# Patient Record
Sex: Female | Born: 1940 | Race: White | Hispanic: No | Smoking: Former smoker
Health system: Southern US, Community
[De-identification: ages and names within clinical notes are randomized; demographics above are authoritative.]

## PROBLEM LIST (undated history)

## (undated) DIAGNOSIS — E785 Hyperlipidemia, unspecified: Secondary | ICD-10-CM

## (undated) DIAGNOSIS — R42 Dizziness and giddiness: Secondary | ICD-10-CM

## (undated) DIAGNOSIS — J189 Pneumonia, unspecified organism: Secondary | ICD-10-CM

## (undated) DIAGNOSIS — Z87442 Personal history of urinary calculi: Secondary | ICD-10-CM

## (undated) DIAGNOSIS — I10 Essential (primary) hypertension: Secondary | ICD-10-CM

## (undated) DIAGNOSIS — M1990 Unspecified osteoarthritis, unspecified site: Secondary | ICD-10-CM

## (undated) DIAGNOSIS — Z9289 Personal history of other medical treatment: Secondary | ICD-10-CM

## (undated) DIAGNOSIS — G459 Transient cerebral ischemic attack, unspecified: Secondary | ICD-10-CM

## (undated) DIAGNOSIS — G458 Other transient cerebral ischemic attacks and related syndromes: Secondary | ICD-10-CM

## (undated) DIAGNOSIS — I2510 Atherosclerotic heart disease of native coronary artery without angina pectoris: Secondary | ICD-10-CM

## (undated) DIAGNOSIS — I4891 Unspecified atrial fibrillation: Secondary | ICD-10-CM

## (undated) DIAGNOSIS — I639 Cerebral infarction, unspecified: Secondary | ICD-10-CM

## (undated) HISTORY — DX: Hyperlipidemia, unspecified: E78.5

## (undated) HISTORY — PX: UMBILICAL HERNIA REPAIR: SHX196

## (undated) HISTORY — PX: HERNIA REPAIR: SHX51

## (undated) HISTORY — PX: CYSTOSCOPY W/ STONE MANIPULATION: SHX1427

## (undated) HISTORY — PX: CHOLECYSTECTOMY OPEN: SUR202

## (undated) HISTORY — DX: Atherosclerotic heart disease of native coronary artery without angina pectoris: I25.10

## (undated) HISTORY — PX: ABDOMINAL HYSTERECTOMY: SHX81

## (undated) HISTORY — DX: Essential (primary) hypertension: I10

## (undated) HISTORY — PX: CARPAL TUNNEL RELEASE: SHX101

## (undated) HISTORY — PX: DILATION AND CURETTAGE OF UTERUS: SHX78

---

## 2001-03-29 DIAGNOSIS — G459 Transient cerebral ischemic attack, unspecified: Secondary | ICD-10-CM

## 2001-03-29 HISTORY — DX: Transient cerebral ischemic attack, unspecified: G45.9

## 2002-03-29 DIAGNOSIS — G458 Other transient cerebral ischemic attacks and related syndromes: Secondary | ICD-10-CM

## 2002-03-29 HISTORY — DX: Other transient cerebral ischemic attacks and related syndromes: G45.8

## 2002-04-29 HISTORY — PX: CORONARY ANGIOPLASTY: SHX604

## 2002-05-04 ENCOUNTER — Ambulatory Visit (HOSPITAL_COMMUNITY): Admission: RE | Admit: 2002-05-04 | Discharge: 2002-05-05 | Payer: Self-pay | Admitting: Cardiology

## 2002-07-23 ENCOUNTER — Encounter: Payer: Self-pay | Admitting: Vascular Surgery

## 2002-07-23 ENCOUNTER — Ambulatory Visit (HOSPITAL_COMMUNITY): Admission: RE | Admit: 2002-07-23 | Discharge: 2002-07-24 | Payer: Self-pay | Admitting: Vascular Surgery

## 2002-09-12 ENCOUNTER — Ambulatory Visit (HOSPITAL_COMMUNITY): Admission: RE | Admit: 2002-09-12 | Discharge: 2002-09-12 | Payer: Self-pay | Admitting: Neurology

## 2002-10-19 ENCOUNTER — Ambulatory Visit (HOSPITAL_COMMUNITY): Admission: RE | Admit: 2002-10-19 | Discharge: 2002-10-19 | Payer: Self-pay | Admitting: Interventional Radiology

## 2002-10-28 HISTORY — PX: CAROTID ARTERY - SUBCLAVIAN ARTERY BYPASS GRAFT: SUR178

## 2002-11-20 ENCOUNTER — Encounter: Payer: Self-pay | Admitting: Vascular Surgery

## 2002-11-22 ENCOUNTER — Inpatient Hospital Stay (HOSPITAL_COMMUNITY): Admission: RE | Admit: 2002-11-22 | Discharge: 2002-11-23 | Payer: Self-pay | Admitting: Vascular Surgery

## 2003-04-30 HISTORY — PX: CARDIAC CATHETERIZATION: SHX172

## 2003-05-17 ENCOUNTER — Ambulatory Visit (HOSPITAL_COMMUNITY): Admission: RE | Admit: 2003-05-17 | Discharge: 2003-05-19 | Payer: Self-pay | Admitting: Cardiology

## 2003-05-27 ENCOUNTER — Inpatient Hospital Stay (HOSPITAL_COMMUNITY): Admission: AD | Admit: 2003-05-27 | Discharge: 2003-06-11 | Payer: Self-pay | Admitting: Cardiology

## 2003-05-28 HISTORY — PX: CORONARY ARTERY BYPASS GRAFT: SHX141

## 2003-05-28 HISTORY — PX: MEDIASTINAL EXPLORATION: SHX5065

## 2003-05-29 ENCOUNTER — Encounter: Payer: Self-pay | Admitting: Cardiology

## 2003-07-05 ENCOUNTER — Encounter
Admission: RE | Admit: 2003-07-05 | Discharge: 2003-07-05 | Payer: Self-pay | Admitting: Thoracic Surgery (Cardiothoracic Vascular Surgery)

## 2007-08-01 ENCOUNTER — Ambulatory Visit: Payer: Self-pay | Admitting: Vascular Surgery

## 2009-01-30 ENCOUNTER — Ambulatory Visit: Payer: Self-pay | Admitting: Vascular Surgery

## 2010-03-05 ENCOUNTER — Ambulatory Visit: Payer: Self-pay | Admitting: Vascular Surgery

## 2010-08-11 NOTE — Procedures (Signed)
CAROTID DUPLEX EXAM   INDICATION:  Followup carotid stenosis and left common carotid artery,  distal subclavian artery bypass graft   HISTORY:  Diabetes:  no  Cardiac:  CABG  Hypertension:  yes  Smoking:  Previous  Previous Surgery:  Left common carotid artery to subclavian artery  bypass graft in 2004  CV History:  CVA in 2001 and 2003 and a TIA with right upper extremity  and lower extremity weakness  Amaurosis Fugax No, Paresthesias Yes, Hemiparesis No                                       RIGHT             LEFT  Brachial systolic pressure:         210               200  Brachial Doppler waveforms:         Normal            Normal  Vertebral direction of flow:        Not visualized    Antegrade  DUPLEX VELOCITIES (cm/sec)  CCA peak systolic                   71                Proximal bypass  graft 320 / mid CCA 72  ECA peak systolic                   57                80  ICA peak systolic                   87                151  ICA end diastolic                   28                43  PLAQUE MORPHOLOGY:                  mixed             mixed  PLAQUE AMOUNT:                      mild              Moderate  PLAQUE LOCATION:                    ICA               CCA / ICA / ECA   IMPRESSION:  1. Doppler velocities suggest 20% to 39% stenosis in the right      internal carotid artery.  2. Doppler velocities suggest 40% to 59% stenosis in the left internal      carotid artery.  3. Patent left subclavian artery to common carotid artery bypass graft      with increased velocities noted in the proximal graft of 320      cm/second.  4. Right  vertebral artery not visualized.  Left vertebral artery      suggests antegrade flow.  5. No significant changes from previous exams.   ___________________________________________  Cristal Deer  Adele Dan, M.D.   NT/MEDQ  D:  03/05/2010  T:  03/05/2010  Job:  [id number]

## 2010-08-11 NOTE — Assessment & Plan Note (Signed)
OFFICE VISIT   Diamond [name], Diamond [name]  DOB:  Apr 18, 1940                                       08/01/2007  ONGEX#:[id number]   I saw the patient in the office today for continued followup of her  carotid disease.  This is a pleasant 70 year old woman who I performed a  left carotid subclavian bypass graft on in 2004.  Since that time I have  been following her with a moderate left carotid stenosis.  Since I saw  her last a year and a half ago she has had no history of stroke, TIAs,  expressive or receptive aphasia or amaurosis fugax.  She continues to  take aspirin and Plavix.   REVIEW OF SYSTEMS:  On review of systems she has had no recent chest  pain, chest pressure, palpitations or arrhythmias.  The remainder of her  review of systems is documented on the medical history form in her  chart.   PHYSICAL EXAMINATION:  On physical examination this is a pleasant 6-  year-old woman who appears her stated age.  Blood pressure is 181/83.  Heart rate is 59.  I do not detect any carotid bruits.  Lungs are clear  bilaterally to auscultation.  On cardiac exam she has a regular rate and  rhythm.  She has a palpable radial pulse bilaterally.  Neurologic exam  is nonfocal.   Her carotid duplex scan shows that there has been no change in her  carotid disease.  She has a 40-59% left carotid stenosis with no  significant stenosis on the right.  Her carotid subclavian bypass graft  is patent.   Overall, I am pleased with her progress and will see her back in 18  months for a followup duplex scan.  She knows the symptoms of  cerebrovascular disease and knows to call if she develops any such  symptoms.  In the meantime she will remain on her aspirin and Plavix.   Diamond [name], M.D.  Electronically Signed   CSD/MEDQ  D:  08/01/2007  T:  08/02/2007  Job:  [id number]

## 2010-08-11 NOTE — Procedures (Signed)
CAROTID DUPLEX EXAM   INDICATION:  Follow up carotid disease and left common carotid artery to  subclavian artery bypass graft.   HISTORY:  Diabetes:  No  Cardiac:  CABG  Hypertension:  Yes  Smoking:  Previous  Previous Surgery:  Left CCA to SCA bypass graft, 2004.  CV History:  CVA 2001 and 2003 and TIA, with right upper extremity and  lower extremity weakness.  Amaurosis Fugax No, Paresthesias Yes, Hemiparesis No                                       RIGHT             LEFT  Brachial systolic pressure:         115               124  Brachial Doppler waveforms:         WNL               WNL  Vertebral direction of flow:        antegrade         antegrade  DUPLEX VELOCITIES (cm/sec)  CCA peak systolic                   80                89  ECA peak systolic                   87                72  ICA peak systolic                   104               189  ICA end diastolic                   36                56  PLAQUE MORPHOLOGY:                  heterogeneous     calcific  PLAQUE AMOUNT:                      mild              moderate  PLAQUE LOCATION:                    ICA/ECA           ICA/ECA   IMPRESSION:  1. Right internal carotid artery shows evidence of 20% to 39%      stenosis.  2. Left internal carotid artery shows evidence of 40% to 59% stenosis      with a slight increase in velocities.  3. Patent left common carotid artery to subclavian artery bypass      graft.        ___________________________________________  Di Kindle. Edilia Bo, M.D.   AS/MEDQ  D:  01/30/2009  T:  01/31/2009  Job:  [id number]

## 2010-08-11 NOTE — Assessment & Plan Note (Signed)
OFFICE VISIT   Diamond [name], Diamond [name]  DOB:  [birth year]                                       01/30/2009  ZOXWR#:[id number]   I saw the patient in the office today for continued followup of her  carotid disease.  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  Since that time I had been  following her with a mild left carotid stenosis.  She comes in for a  year and a half followup visit.  Since I saw her last, she had no  history of stroke, TIAs, expressive or receptive aphasia or amaurosis  fugax.  She has had no pain or weakness in her left arm where she had  the bypass.   In addition, she has a history of hypercholesterolemia which has been  stable on her current medications.  She also has coronary artery disease  but has had no recent symptoms.  She has had no chest pain or chest  pressure.  She underwent coronary revascularization in 2005 by Dr.  Dorris Fetch.   PAST MEDICAL HISTORY:  Otherwise fairly unremarkable.  She denies any  history of diabetes, hypertension, history of congestive heart failure  or history of COPD.   FAMILY HISTORY:  Her father died from a heart attack at age 45.  She is  unaware of any history of premature cardiovascular disease.   SOCIAL HISTORY:  She is widowed.  She has 1 child.  She quit tobacco 10  years ago.  She does not use alcohol on a regular basis.   REVIEW OF SYSTEMS:  NEUROLOGIC:  She has had occasional headaches.  MUSCULOSKELETAL:  She does have a history of arthritis and joint pain.  General, cardiac, pulmonary, GI, GU, psychiatric, ENT, hematologic, and  integument review of systems are unremarkable.  VASCULAR:  She did have a previous left hemispheric stroke in the past  with some mild residual right lower extremity weakness.  She has had no  claudication, rest pain or nonhealing ulcers.  No  history of DVT or  phlebitis.   PHYSICAL EXAMINATION:  This is a pleasant 70 year old woman who appears  her stated age.  Blood pressure 149/91 on the right and 139/81 on the  left, heart rate is 56, respiratory rate is 16.  HEENT:  Extraocular  motions are intact.  Pupils are equal, round and reactive to light and  accommodation.  Conjunctivae are normal.  Neck:  Supple.  There is no  JVD.  There is no cervical lymphadenopathy.  Lungs:  Clear bilaterally  to auscultation.  There are no rales, rhonchi or wheezing.  Cardiovascular:  She does have a left carotid bruit.  She has a regular  rate and rhythm without murmur or gallop appreciated.  She has no  significant peripheral edema.  She has palpable radial pulses and  palpable femoral pulse.  I cannot palpate posterior tibial pulses;  however, both feet are warm and well-perfused without ischemic ulcers.  Abdomen:  Soft and nontender with normal pitched bowel sounds.  I do not  appreciate any masses.  Musculoskeletal:  There are no major deformities  or cyanosis.  Neurologic:  She really has good strength in her upper  extremities bilaterally with no focal  weakness noted.  Skin:  There are  no significant ulcers or rashes.   I did independently interpret her carotid duplex scan today which shows  evidence of a 40% to 59% left carotid stenosis with no significant  stenosis on the right.  Both vertebral arteries are patent with normally  directed flow, and arm pressures are equal.  I have compared this to her  previous study back in May 2009 and although the stenosis in the left is  in the same category, the velocities have increased slightly.   I recommend we see her back in 1 year for a followup carotid duplex  scan.  She understands we would not consider left carotid endarterectomy  unless the stenosis progressed to greater than 80% or she developed new  neurologic symptoms.  She does know to continue taking her aspirin and   Plavix.   Di Kindle. Edilia Bo, M.D.  Electronically Signed   CSD/MEDQ  D:  01/30/2009  T:  01/31/2009  Job:  [id number]

## 2010-08-11 NOTE — Procedures (Signed)
CAROTID DUPLEX EXAM   INDICATION:  Followup known carotid artery disease.   HISTORY:  Diabetes:  No.  Cardiac:  CABG in 06/03/2003.  Hypertension:  Yes.  Smoking:  Quit in 2001.  Previous Surgery:  Left subclavian to CCA bypass graft in 2004.  CV History:  CVA in 2001 and 2003.  Amaurosis Fugax No, Paresthesias No, Hemiparesis No                                       RIGHT             LEFT  Brachial systolic pressure:         140               150  Brachial Doppler waveforms:         Biphasic          Biphasic  Vertebral direction of flow:        Antegrade         Antegrade  DUPLEX VELOCITIES (cm/sec)  CCA peak systolic                   77                68  ECA peak systolic                   51                62  ICA peak systolic                   101               160  ICA end diastolic                   30                49  PLAQUE MORPHOLOGY:                  Heterogeneous     Calcified  PLAQUE AMOUNT:                      Mild              Mild to moderate  PLAQUE LOCATION:                    ICA and ECA       ICA   IMPRESSION:  1. A 40 to 59% stenosis in the left ICA.  Patent left CCA to      subclavian bypass graft.  2. A 1 to 39% stenosis noted in the right ICA.  Antegrade bilateral      vertebral arteries.      ___________________________________________  Di Kindle. Edilia Bo, M.D.   MG/MEDQ  D:  08/01/2007  T:  08/01/2007  Job:  [id number]

## 2010-08-14 NOTE — H&P (Signed)
[name], [name] NO.:  [id number]   MEDICAL RECORD NO.:  [id number]                   PATIENT TYPE:  OIB   LOCATION:  2899                                 FACILITY:  [facility]   PHYSICIAN:  Diamond [name], M.D. [facility]              DATE OF BIRTH:  [birth year]   DATE OF ADMISSION:  05/04/2002  DATE OF DISCHARGE:                                HISTORY & PHYSICAL   CHIEF COMPLAINT:  Dyspnea on exertion.   HISTORY OF PRESENT ILLNESS:  The patient is a 70 year old female with no  known history of coronary artery disease who was evaluated by Dr. Tomie [name]  for dyspnea on exertion.  There was concern for this being an anginal  equivalent and she was scheduled for a stress test.  She had an adenosine  Cardiolite and during the test, she reacted to the adenosine with reaction  that the patient described as she was told she quit breathing.  She states  they sat her up and gave her IV fluids and her symptoms resolved.  The  Cardiolite images showed lateral ischemia.  Her EF was within normal limits.  She was referred for catheterization.  The patient describes a history of  consistent dyspnea on exertion, but no resting symptoms.  She is symptom  free at the time of exam.   PAST MEDICAL HISTORY:  1. Hypertension.  2. Dyslipidemia.  3. No history of diabetes.  4. No family history of premature coronary artery disease.  5. Remote history of tobacco.  6. She is not obese.  7. She has a history of CVA x3.  8. Peripheral vascular disease with left carotid that has a 60-79% stenosis.  9. Echocardiogram showed mild MR, but no other significant abnormalities.  10.      History of gastroesophageal reflux disease and peptic ulcer     disease.   PAST SURGICAL HISTORY:  1. Status post cholecystectomy.  2. Status post hysterectomy.  3. Evaluation at [facility] after her CVAs, but the results of the     evaluation are not available at this time.   ALLERGIES:  CODEINE.   The patient states that three years ago she had a  reaction including itching and hives secondary to IVP DYE.  She is not sure  whether she has had dye since that time or not, but she had burning with a  medication that they gave her at [facility] during a test.   MEDICATIONS:  1. Atenolol 25 mg q.d.  2. Diovan 80 mg q.d.  3. Plavix 75 mg q.h.s.  4. Aspirin 325 mg q.d.  5. Atenolol 50 mg q.d.  6. Protonix 40 mg q.d.  7. Naproxen 500 mg b.i.d. p.r.n.  8. Trihexyphenadyl 2 mg q.h.s. p.r.n.  9. Pravachol 20 mg q.d.   SOCIAL HISTORY:  She lives in [place] with her daughter.  She does  not  drink alcohol.  She is disabled secondary to CVAs.  She has a 60-pack-year  of tobacco use and quit in 2001.  She does not exercise.   FAMILY HISTORY:  Her mother died at age 50 of a CVA.  Father died at age 53  of an MI.  She has one sister with a DVT.  Brother with a congenital heart  defect.  No other coronary artery disease in family.   REVIEW OF SYMPTOMS:  MUSCULOSKELETAL:  Positive for occasional night time  leg cramps and occasional anxiety.  CARDIOPULMONARY:  The dyspnea on  exertion is as described above.  No recent fevers or chills.  No recent  illness of any type.  GASTROINTESTINAL:  She denies any kind of reflux  symptoms, hematemesis or melena at this time.  Review of systems is  otherwise negative.   PHYSICAL EXAMINATION:  VITAL SIGNS:  Temperature 97.9, blood pressure  138/60, pulse 46, respirations 20.  GENERAL:  She is a well-developed, well-nourished, middle-aged, white female  in no acute distress.  HEENT:  Head is normocephalic and atraumatic with pupils equal round and  reactive to light and accommodation.  Extraocular movements intact.  Sclerae  clear.  Nares without discharge.  NECK:  Supple without lymphadenopathy, thyromegaly or JVD.  Bilateral  carotid bruits, right greater than left.  CARDIAC:  Heart is regular rate and rhythm with a soft 1-2/6 systolic  ejection  murmur at the apex.  Her distal pulses are 1-2+.  No femoral bruits  are appreciated and femoral pulses are slightly decreased.  LUNGS:  Clear to auscultation bilaterally.  ABDOMEN:  Soft, nontender with active bowel sounds and no hepatosplenomegaly  noted.  EXTREMITIES:  No clubbing, cyanosis or edema.  MUSCULOSKELETAL:  There is no joint deformity or effusion with no spine or  CVA tenderness.  NEUROLOGIC:  She is alert and oriented.  Cranial nerves 2-12 grossly intact.   LABORATORY DATA AND X-RAY FINDINGS:  Chest x-ray was performed as an  outpatient and report is not available at this time.  EKG dated March 12, 2002, shows sinus bradycardia with T wave changes in V5-V6 and no old  records to compare.   Hemoglobin 11.6, hematocrit 34.6, WBC 6.7, platelets 281.  Sodium 138,  potassium 4.9, chloride 102, CO2 22, BUN 22, creatinine 0.8 and glucose 99.  Her TSH was 1.490 with an INR of 1.1 and PTT 27.   ASSESSMENT/PLAN:  1. Dyspnea on exertion with abnormal Cardiolite.  This is most likely an     anginal equivalent.  She is for cardiac catheterization and all of her     laboratory values are within normal limits.  She will be followed up     after the cardiac catheterization.  2. The patient is otherwise stable and is to be continued on her own     medications.     Diamond [name], P.A. [facility]                  Diamond [name], M.D. [facility]    RG/MEDQ  D:  05/04/2002  T:  05/04/2002  Job:  [id number]

## 2010-08-14 NOTE — Consult Note (Signed)
NAME:  Diamond [name], Diamond [name]                            ACCOUNT NO.:  [id number]   MEDICAL RECORD NO.:  [id number]                   PATIENT TYPE:  INP   LOCATION:  6524                                 FACILITY:  [facility]   PHYSICIAN:  Salvatore Decent. Dorris Fetch, M.D.         DATE OF BIRTH:  02-23-1941   DATE OF CONSULTATION:  05/27/2003  DATE OF DISCHARGE:                                   CONSULTATION   CHIEF COMPLAINT:  Exertional shortness of breath.   HISTORY OF PRESENT ILLNESS:  Diamond [name] is a 70 year old white female with a  history of atherosclerotic cardiovascular disease including coronary artery  disease with a prior PTCA of her left circumflex status post a history of  cerebrovascular accident x3; two strokes, and a TIA. She presents with  approximately a one year history of exertional shortness of breath. This has  been worse over the past couple of months. She was seen and evaluated by Dr.  Tomie [name] in [place] who did a stress test. This showed a normal ejection  fraction, but there was evidence of anterolateral ischemia. She subsequently  then was sent for evaluation by Diamond [name] and at catheterization  again was found to have a normal ejection fraction. The right coronary had a  60% stenosis, the circumflex had a 50%, the LAD had a 50-60% at the take off  of the first diagonal which arose from a right angle and a 70% lesion at the  osteal of the diagonal. She had oozing from her left groin site, but was  subsequently discharged the next day. Diamond [name] saw Diamond [name] in the office  today to further discuss options for possible angioplasty of the right  coronary tomorrow, but and EKG in his office noted some anterior lateral T-  wave changes and she was admitted for further evaluation. She says that she  has had a burning type of pain in the substernal area, this is usually  related to exertion and sometimes occurs with her shortness of breath. She  has significant exertional  dyspnea, which has been progressive over the past  year.   PAST MEDICAL HISTORY:  Significant for atherosclerotic cardiovascular  disease. She has a history of coronary artery disease, history of TIA x1 and  stroke x2. She has some mild residual from her stroke with a mild right hand  intention tremor and some right lower extremity weakness. She also has a  history of hypertension, hyperlipidemia, mild mitral regurgitation by echo,  history of tobacco abuse, which she quit five years ago. A remote history of  peptic ulcer disease, history of hysterectomy, and cholecystectomy. She had  a left carotid subclavian bypass by Diamond [name] in 2004.   CURRENT MEDICATIONS:  1. Plavix 75 mg daily.  2. Protonix 40 mg daily.  3. Mobic 7.5 mg daily.  4. Pravachol 80 mg daily.  5. Diovan 80 mg daily.  6. Aspirin  325 mg daily.   ALLERGIES:  She had an allergic reaction to IV dye 30 years ago. She also  has a history of an allergy to codeine.   SOCIAL HISTORY:  She is retired. She has a long history of tobacco abuse,  but quit five years ago just prior to her second stroke.   FAMILY HISTORY:  Mother died at the age of 25 of a stroke, father at age 30  of an MI. She has six siblings all alive and well.   REVIEW OF SYSTEMS:  Exertional shortness of breath. She does complain of  cramping pain in her legs when she walks, although she was walking up to  three miles daily last year. She has not had any dizziness or syncopal  spells since her left carotid subclavian bypass. She does have some  paroxysmal nocturnal dyspnea. No orthopnea. No peripheral edema. No history  of DVT. She does bleed and bruise easily. All other systems are negative.   PHYSICAL EXAMINATION:  GENERAL:  Diamond [name] is a 70 year old white female in  no acute distress. Well-developed, well-nourished. She is overweight.  VITAL SIGNS:  Heart rate is 60 and regular. Respirations are 16.  HEENT:  Unremarkable.  NEUROLOGIC:   She is alert and oriented x3. Her speech is fluent. She does  have slight tremor of the right hand. She also has some weakness on  dorsiflexion of the right lower extremity.  NECK:  Supple. She has a left carotid bruit. She has a well-healed scar in  the left supraclavicular fossa from a previous carotid subclavian bypass.  CARDIAC:  Regular rate and rhythm. Normal S1 and S2. No murmur, rub or  gallop.  LUNGS:  Clear with equal breath sounds bilaterally.  ABDOMEN:  Soft and nontender.  EXTREMITIES:  Without cyanosis, clubbing, or edema. She does have a palpable  left radial pulse and a 1+ right radial pulse. Not able to palpate distal  pulses on her legs.  SKIN:  Warm and dry.   LABORATORY DATA:  From her last admission her sodium was 135, potassium 3.9,  BUN and creatinine 21 and 1.2. Hematocrit 33, white count 10.1, platelets  340.   EKG shows anterolateral T-wave change.   IMPRESSION:  Diamond [name] is a 70 year old white female with a history of  atherosclerotic cardiovascular disease. She now presents with exertional  shortness of breath by history with positive Cardiolite and positive EKG  changes. She had a cardiac catheterization last week, which showed three  vessel disease, although she does not have any critical type lesions. She  did have some heavily calcified lesions in her LAD and her diagonal and the  diagonal rose at a right angle was not favorable for percutaneous  intervention. There was consideration given to percutaneous coronary  intervention of her right coronary, but given her anterolateral EKG changes  and Cardiolite which showed anterolateral changes it was thought that she  needed complete revascularization.   Given her history, positive stress test, EKG changes, and catheterization  findings coronary artery bypass grafting is indicated. She is potentially a candidate for off pump grafting. I discussed with Diamond [name] the indications,  risks, benefits, and  alternatives. She understands the risks of surgery  included, but not limited to death, stroke, MI, DVT, bleeding, possible need  for transfusions, and correction as well as other organ system dysfunction  including respiratory, renal, or GI complications. She understands these  risks, accepts them, and agrees to proceed. She understands that  she is at  high risk for a stroke relative to the general population given her prior  history, but she might have less risk of a stroke with an off pump approach.  Given that she needs multiple grafts we will not be able to totally avoid  clamping her aorta. She also may need to have a free mammary utilized given  her history of subclavian disease.   The patient has been on Plavix long term for the past five years and had  bleeding issues post catheterization. We will hold the Plavix beginning  tomorrow since she already received her dose today, but then she will need  to wait five to  seven days off of Plavix before proceeding with surgery, so therefore, we  will plan coronary artery bypass grafting next Monday, March 7 with possible  off pump with cardiopulmonary bypass as backup if technical difficulties  were to arise.                                               Salvatore Decent Dorris Fetch, M.D.    SCH/MEDQ  D:  05/27/2003  T:  05/27/2003  Job:  [id number]   cc:   Arturo Morton. Riley [name], M.D.   Aundra Dubin. Revankar, M.D.  [address]  [place]  [place] [address]  Fax: [phone]   Philemon Kingdom  [address]  [place]  [place] [address]  Fax: [phone]

## 2010-08-14 NOTE — Discharge Summary (Signed)
NAME:  Diamond [name], Diamond [name] NO.:  [id number]   MEDICAL RECORD NO.:  [id number]                   PATIENT TYPE:  INP   LOCATION:  2032                                 FACILITY:  [facility]   PHYSICIAN:  Salvatore Decent. Dorris Fetch, M.D.         DATE OF BIRTH:  [birth year]   DATE OF ADMISSION:  05/27/2003  DATE OF DISCHARGE:  06/11/2003                                 DISCHARGE SUMMARY   ADMIT DIAGNOSIS:  Exertional shortness of breath.   PAST MEDICAL HISTORY:  1. Atherosclerotic cardiovascular disease.  2. History of coronary artery disease.  3. History of TIA x1.  4. History of CVA x2.  The patient does have some mild residual from a     stroke with a mild right hand intention tremor and some right lower     extremity weakness.  5. Hypertension.  6. Hyperlipidemia.  7. Mild mitral regurgitation by echocardiogram.  8. History of tobacco abuse.  9. Remote history of peptic ulcer disease.  10.      Status post hysterectomy.  11.      Status post cholecystectomy.  12.      Subclavian steal syndrome, status post left subclavian bypass by     Dr. Di Kindle. Dickson in 2004.  13.      Status post appendectomy.  14.      Obesity.  15.      History of foot fracture in the past.   ALLERGIES:  The patient has had an allergic reaction to IV DYE,  approximately 30 years ago, as well as a history of an allergy to CODEINE.   DISCHARGE DIAGNOSES:  1. Two-vessel coronary artery disease with unstable angina, status post     coronary artery bypass grafting x4.  2. Ventricular fibrillation arrest, status post coronary artery bypass     grafting; rule out ischemia, status post reexploration of the     mediastinum.   BRIEF HISTORY:  The patient is a 70 year old white female with a history of  atherosclerotic cardiovascular disease including coronary artery disease  with a prior PTCA of the left circumflex, status post history of CVA x3.  The patient, in fact, had 2 strokes  and 1 TIA.  The patient presented with  approximately a 1-year history of exertional shortness of breath which had  been worrisome over the past couple of months.  The patient was evaluated by  Dr. Aundra Dubin. Revankar in [place], who did a stress test which showed normal  ejection fraction but evidence of anterolateral ischemia.  The patient  subsequently was sent for evaluation by Dr. Arturo Morton. Stuckey and at  catheterization, again was found to have a normal ejection fraction with  three-vessel coronary artery disease.  The patient had oozing from a groin  site but was subsequently discharged the next day.  The patient then saw Dr.  Riley Kill in the office on May 27, 2003  to further discuss her options  for a possible angioplasty of the right coronary artery, however, the EKG  had also noted some anterolateral T wave changes and the patient was  subsequently admitted for further evaluation.  The patient stated that she  had had a burning type of pain in the substernal area which was usually  related to exertion and occasionally occurred with shortness of breath.  There was consideration given to percutaneous coronary intervention of her  right coronary but given the anterolateral EKG changes and Cardiolite which  showed anterolateral changes, it was thought that she would better benefit  from surgical revascularization.  Dr. Salvatore Decent. Dorris Fetch was then  consulted at that time.  After review of the patient's information, it was  Dr. Sunday Corn opinion that the patient would indeed benefit from  coronary surgical revascularization.  Given that the patient had been on  Plavix for long-term for the past 5 years, her Plavix was held and it was  necessary for her to wait 5-7 days off the Plavix before proceeding with  surgery.  Dr. Marlan Palau of the neurology service was also consulted on  May 27, 2003 for clearance prior to CABG.  Dr. Anne Hahn evaluated the  patient and it was  his opinion that there was no direct contraindication for  coronary revascularization at that time.   HOSPITAL COURSE:  The patient was admitted on May 27, 2003 after being  evaluated by Dr. Riley Kill in his office that day to discuss options for  possible angioplasty of the right coronary artery, however, an EKG in his  office noted some anterolateral T wave changes and therefore the patient was  admitted for further evaluation.  The patient was found to not be a suitable  candidate for angioplasty and was thought to better benefit from surgical  revascularization.  Dr. Charlett Lango was consulted and it was his  opinion that the patient was a candidate and would be best served with  coronary artery bypass grafting.  Due to the patient's history of  cerebrovascular accidents and subclavian steal syndrome, Dr. Lesia Sago of  neurology service was also consulted for clearance of the patient for  coronary artery bypass grafting.  Dr. Anne Hahn evaluated Ms. Adriana Simas on May 27, 2003 and it was his opinion that there was no direct contraindication  for CABG.  The patient's surgery was then scheduled for approximately 5-7  days after she had been taken off of Plavix.  Routine care was provided for  Ms. Matty in the meantime.  She remained stable throughout the hospital stay  prior to CABG.  The patient was then taken to the OR on June 03, 2003 for  coronary artery bypass grafting x4 with the left internal mammary artery  grafted to the LAD, saphenous vein grafted to the first diagonal, sequential  saphenous vein graft to the distal right coronary artery and posterior  descending artery.  Endoscopic vein harvest was performed on the right  thigh.  The patient tolerated the procedure well and was hemodynamically  stable immediately postoperatively.  The patient was transferred from the OR to the SICU after an uncomplicated bypass surgery in stable condition.  The  patient was initially a  little intravascularly dry.  She was, however, in  sinus rhythm with VVI pacing backup.  Without warning, the patient  subsequently went into a ventricular fibrillation arrest and CPR was  initiated immediately and ACLS was instituted.  The patient was shocked x3  and was biphasic and she was given epinephrine, lidocaine and amiodarone IV.  The patient was converted after 7 minutes.  The patient had a good blood  pressure and EKG after the code showed ST elevation in the inferior leads.  Due to this, the patient was emergently taken back to the OR.  She was  hemodynamically stable at that time.  The patient was returned to the OR,  where she underwent a reexploration of the mediastinum.  There was a good  Doppler signal in all the grafts.  The veins were stripped easily.  A  venotomy was performed in the right coronary/PA graft with a 1.5-mm probe  passed easily into the RCA and PDA.  The patient remained stable throughout  the reexploration surgery and tolerated the procedure well.  A TEE probe  showed good left ventricular function and good wall motion in all segments.  The patient was then transferred back to the SICU in stable condition.  The  patient was subsequently extubated without problem and woke up from  anesthesia neurologically intact.  The patient remained on amiodarone  postoperatively.  The patient continued to slowly progress postoperatively.  On postoperative day 3, the patient was noted to be anemic and therefore she  was transfused with a unit of packed RBCs.  The patient continued to be in  volume overload and her diuresis was continued.  The patient was also noted  to be bradycardic on postoperative day 3 and her A-pacing was continued, as  well as her amiodarone was discontinued as she had had no further  arrhythmias.  The patient was then transferred from the SICU to the  telemetry unit 2000 on postoperative day 3 without complication.  Cardiac  rehab was initiated  on postoperative day 3.  The patient initially did not  tolerate this well, as she was lethargic and her O2 saturations dropped to  81% on room air.  Her O2 saturation quickly returned to 94% on 2 L.  The  patient has continued to slowly progress as expected postoperatively.  Cardiac rehab has been continued postoperatively and she has improved over  time.  The patient had no further arrhythmias and her pacer was removed on  postoperative day 6 without complication.  On postoperative day 7, the  patient was afebrile, her vital signs were stable with a blood pressure of  116/70 and a heart rate of 70.  The patient was in normal sinus rhythm.  The  patient's O2 saturation was 90% on room air.  Her wounds were healing well  and her lungs were clear and cardiac was regular rate and rhythm.  The  patient was doing very well at this time, her pain control was better and she is tolerating the cardiac rehab much better.  The patient's pacer wires  were discontinued and it is felt that as long as she continues to progress  in the same manner, she will be stable for discharge for the next 1-2 days.   LABORATORIES:  CBC on June 07, 2003:  White count 8.1, hemoglobin 9.5,  hematocrit 28.3, platelets 212,000.  BMP on June 08, 2003:  Sodium 133,  potassium 3.5, BUN 24, creatinine 1.2, glucose 94.   CONDITION ON DISCHARGE:  Condition on discharge is improved.   INSTRUCTIONS:   MEDICATIONS:  1. Enteric-coated aspirin 325 mg p.o. daily.  2. Atenolol 50 mg 1/2 tab daily.  3. Diovan 80 mg p.o. daily.  4. Pravachol 80 mg p.o. daily.  5.  Protonix 40 mg p.o. daily.  6. Plavix 75 mg p.o. daily.  7. Lasix 40 mg p.o. daily x5 days.  8. Potassium chloride 20 mEq p.o. daily x5 days.  9. Colace 100 mg 2 tabs p.o. daily.   PAIN MANAGEMENT:  Tylox 1 to 2 p.o. q.4-6 h.   ACTIVITY:  No driving, no lifting more than 10 pounds.  The patient is to  continue daily breathing and walking exercises.   DIET:  Low  salt, low fat, low cholesterol.   WOUND CARE:  The patient may shower daily and clean the incisions with soap  and water.  If wound problems arise, the patient is to call the CVTS office.   FOLLOWUP APPOINTMENTS:  1. The patient will need to establish a followup appointment with Dr.     Tomie China 2 week after discharge.  2. The patient has an appointment with Dr. Dorris Fetch 3 weeks after     discharge; the CVTS office will call the     patient with that appointment.  The patient will also need to go to     [facility] 1 hour before the appointment with Dr.     Dorris Fetch to have a x-ray taken, which she will bring with her to the     appointment with Dr. Dorris Fetch.      Pecola Leisure, PA                      Salvatore Decent. Dorris Fetch, M.D.    AY/MEDQ  D:  06/10/2003  T:  06/12/2003  Job:  [id number]   cc:   Arturo Morton. Riley Kill, M.D. [facility]   Viviann Spare C. Dorris Fetch, M.D.  [address]  [place]  [place] [address]

## 2010-08-14 NOTE — Op Note (Signed)
NAME:  Diamond [name], Diamond [name] NO.:  [id number]   MEDICAL RECORD NO.:  [id number]                   PATIENT TYPE:  INP   LOCATION:  3303                                 FACILITY:  [facility]   PHYSICIAN:  Di Kindle. Edilia Bo, M.D.        DATE OF BIRTH:  1941-01-16   DATE OF PROCEDURE:  11/22/2002  DATE OF DISCHARGE:                                 OPERATIVE REPORT   PREOPERATIVE DIAGNOSES:  Left subclavian occlusion.   POSTOPERATIVE DIAGNOSES:  Left subclavian occlusion.   OPERATION PERFORMED:  Left carotid subclavian bypass with a 6 mm Dacron  graft.   SURGEON:  Di Kindle. Edilia Bo, M.D.   ASSISTANT:  1. Quita Skye. Hart Rochester, M.D.  2. Rowe Clack, P.A.-C.   ANESTHESIA:  General.   INDICATIONS FOR PROCEDURE:  The patient is a 70 year old woman whom I have  been following with a subclavian stenosis.  She developed progressive  symptoms of dizziness and vertebrobasilar symptoms and therefore carotid  subclavian bypass was recommended. The procedure and potential complications  were discussed with the patient in detail preoperatively.  All of her  questions were answered and she was agreeable to proceed.   DESCRIPTION OF PROCEDURE:  The patient was taken to the operating room and  received a general anesthetic. The left supraclavicular area was prepped and  draped in the usual sterile fashion.  An incision was made above the  clavicle extending from the clavicular head at the sternocleidomastoid out  approximately 10 to 12 cm.  The dissection was carried down to the platysma  and then the clavicular head of the sternocleidomastoid was divided.  The  omohyoid was also divided.  The thoracic duct was identified and ligated  between 3-0 silk ties.  The lymphatic fat pad was mobilized inferiorly and  reflected superiorly exposing the anterior scalene muscle. The phrenic nerve  was not identified over the anterior scalene but was more medial to this and  this was identified and preserved.  The anterior scalene was then divided  and beneath this the subclavian artery controlled with a blue vessel loop as  was a thyrocervical trunk branch.  Through the same incision, the common  carotid artery was exposed posterior to the jugular vein.  An adequate  length was exposed that it could be clamped for the proximal anastomosis.  The patient was then heparinized.  Clamps were placed proximally and  distally on the common carotid artery and a longitudinal arteriotomy was  made.  A 5 mm punch was used and then the arteriotomy extended slightly.  The 6 mm Dacron graft was then sewn end-to-side to the common carotid artery  using a 5-0 Prolene suture.  The patient was then placed in Trendelenburg  position and then prior to completing the end-to-end anastomosis the vessels  were back bled and flushed appropriately and then the anastomosis completed  and the graft clamped.  Flow was re-established  through the common carotid  artery.  Next the subclavian artery was clamped proximally and distally and  the thyrocervical branch was controlled and then a longitudinal arteriotomy  made in the subclavian artery.  The graft was then cut to the appropriate  length, spatulated and sewn end-to-side to the subclavian artery using a  continuous 5-0 Prolene suture.  Prior to completing the anastomosis, the  vessels were backbled  and flushed appropriately and the anastomosis  completed. The flow was then re-established to the left arm.  At the  completion, hemostasis was obtained. The wound was irrigated with copious  amounts of saline.  The wound was then closed with a deep layer of 3.0  Vicryl.  The platysma was closed with running 3-0 Vicryl and the skin closed  with a 4-0 subcuticular stitch.  A sterile dressing was applied.  The  patient awoke neurologically intact.                                                  Di Kindle. Edilia Bo, M.D.    CSD/MEDQ   D:  11/22/2002  T:  11/22/2002  Job:  [id number]   cc:   Melvyn Novas, M.D.  [address]  [address]  [place]  [place] [address]  Fax: [phone]

## 2010-08-14 NOTE — H&P (Signed)
NAME:  Diamond [name], Diamond [name] NO.:  [id number]   MEDICAL RECORD NO.:  [id number]                   PATIENT TYPE:  INP   LOCATION:  6524                                 FACILITY:  [facility]   PHYSICIAN:  Arturo Morton. Riley Kill, M.D.             DATE OF BIRTH:  26-Oct-1940   DATE OF ADMISSION:  05/27/2003  DATE OF DISCHARGE:                                HISTORY & PHYSICAL   CHIEF COMPLAINT:  Dyspnea.   HISTORY OF PRESENT ILLNESS:  Diamond [name] is a 70 year old female who has been  referred by Dr. Tomie China.  She had prior PCI involving the A-V circumflex  which was a small vessel about a year ago.  She now presents with recurrent  episodes of shortness of breath and had a Cardiolite which suggested  anterolateral ischemia.  Catheterization showed a significant diagonal  lesion which was not felt to be favorable for percutaneous intervention, as  well as segmental progressive disease of the LAD.  Neither of these was felt  to be ideal for intervention.  In addition, she had a right lesion which was  slightly worse than previously.  We had considered PCI of the RCA but she  presents today for an evaluation prior to the intervention, and there are  anterolateral EKG changes noted on EKG.  Because of this, she is admitted  for further preprocedural surgical consultation.   PAST MEDICAL HISTORY:  1. 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  She had a TIA     in 2003 with some confusion and dizziness.  2. She has had prior percutaneous intervention by Dr. Juanda Chance in April 2004     and a recent catheterization by me.  3. Hypertension.  4. Recent fracture of the left foot.  5. Prior cholecystectomy.  6. Prior hysterectomy.   ALLERGIES:  The patient has had IVP DYE.  She is also intolerant of CODEINE.   MEDICATIONS:  1. Plavix 75 mg daily.  2. Protonix 40 daily.  3. Mobic 7.5 mg one daily.  4. Pravachol 40 mg two tablets daily.  5. Diovan 80 mg daily.  6. Enteric-coated aspirin 325 mg daily.   REVIEW OF SYSTEMS:  Negative for constitutional symptoms.  HEENT:  There is  some shortness of breath with exertion but that is the main symptomatology  at the present time.  She has some weakness in the right leg.   PHYSICAL EXAMINATION:  GENERAL:  She is an alert and oriented female in no  acute distress.  VITAL SIGNS:  The temperature is 97.4, pulse 50, respiratory rate 18, blood  pressure 144/80, SAO2 is 95% on room air.  NECK:  There is a left bruit noted around  the carotid area and the  subclavian area where she has a scar.  There is minimal right carotid bruit.  LUNGS:  The lung fields are clear to auscultation and percussion.  HEART:  The cardiac rhythm is regular.  There is no significant PMI  displacement.  First and second heart sounds are normal.  There is no S4  gallop.  ABDOMEN:  Soft.  There are soft bifemoral bruits with a small knot in the  left groin at the previous catheterization site and a small amount of  ecchymosis.  Distal pulses are intact and there is no evidence of swelling  or significant edema.  NEUROLOGIC:  Remarkable for diminished motor activity in the right upper and  right lower extremity with 4/5 findings.   EKG reveals anterolateral T wave inversion.  This is new from the previous  tracing.  No other laboratory studies have been obtained.   IMPRESSION:  1. Probable ischemia in the anterolateral territory.  We had initially tried     to consider PCI of the right coronary artery as the diagonal was not     favorable.  However, with the EKG changes I am inclined to have a     surgical consult to see whether she can be revascularized.  With prior     CVA she is at significant risk for periprocedural event but we could     consider possibly an off-pump bypass with a hybrid  approach to the right     coronary.  2. Prior cerebrovascular accident.  3. Prior percutaneous coronary intervention.  4. Prior cholecystectomy.  5. Prior hysterectomy.   PLAN:  We will get a CVTS and neurology consult.  The options at this point  include possible hybrid approach.  Alternatively, we could consider medical  therapy.  We will try to get this firmed up in the next day or two.  Meanwhile, she will be admitted.  She is on Plavix and this will need to be  addressed by the surgical team.                                                Arturo Morton. Riley Kill, M.D.    TDS/MEDQ  D:  05/27/2003  T:  05/27/2003  Job:  [id number]   cc:   Di Kindle. Edilia Bo, M.D.  [address]  [place]  [place] [address]   CVTS office   Aundra Dubin. Revankar, M.D.  [address]  [place]  [place] [address]  Fax: [phone]

## 2010-08-14 NOTE — Cardiovascular Report (Signed)
NAME:  Diamond [name], DEVENEY NO.:  [id number]   MEDICAL RECORD NO.:  [id number]                   PATIENT TYPE:  OIB   LOCATION:  2037                                 FACILITY:  [facility]   PHYSICIAN:  Arturo Morton. Riley Kill, M.D.             DATE OF BIRTH:  [birth year]   DATE OF PROCEDURE:  05/17/2003  DATE OF DISCHARGE:                              CARDIAC CATHETERIZATION   INDICATIONS:  Diamond [name] is a delightful 70 year old woman who has had several  prior strokes.  She also has had some progressive shortness of breath which  was relieved with the last percutaneous intervention involving the AV  circumflex.  She now presents with some exertional shortness of breath.  On  the treadmill, she went for three minutes.  There were no EKG changes or  chest pain, but she did have some anterolateral ischemia.  The current study  was done to assess coronary anatomy.   PROCEDURES:  1. Left heart catheterization.  2. Selective coronary arteriography.  3. Selective left ventriculography.   DESCRIPTION OF PROCEDURE:  Using a Smart needle and sterile prep and drape,  the left femoral artery was entered.  The J wire was passed into the left  femoral artery and a 6 French sheath placed.  Following this, intra-aortic  nitroglycerin was given because of an elevated blood pressure.  She was  given additional relaxation medication and 10 mg of Labetolol.  The left  main was then injected using a standard JL-4 Judkins catheter.  Following  this, a standard Judkins catheter was used to inject the right coronary  artery.  Central aortic and left ventricular pressures were measured with a  pigtail.  Ventriculography was performed in the RAO projection.   I then reviewed the films.  The procedure was subsequently completed.  I  pulled the sheath in the catheterization laboratory and held the groin for  five to seven minutes followed by an additional 15 minutes from the  catheterization  technologist.  There was good hemostasis and she was taken  to the holding area in satisfactory clinical condition.   HEMODYNAMIC DATA:  1. Central aorta:  207/91.  2. Left ventricle:  146/9.  3. No gradient on pullback across the aortic valve.   ANGIOGRAPHIC DATA:  1. Ventriculography was performed in the RAO projection.  Overall systolic     function was well preserved and no segmental abnormalities or contraction     were identified.  2. The left main coronary artery was free of critical disease.  3. The left anterior descending artery coursed to the apex. The LAD itself     is moderately calcified and fairly heavily plaqued.  In the mid portion     at the take off of the septal and diagonal, there is probably 50-60%     segmental narrowing at this location with an MLD in the range of about  2.     The diagonal itself probably has 70% ostial narrowing and has a near     right angle take off from the LAD proper.  Just distal to this area, is a     fair amount of segmental irregularity in a second diagonal branch.  High     grade disease is not noted, however.  4. There is a fair size ramus intermedius vessel.  First marginal has mild     luminal irregularity, but no high grade areas of stenosis.  The AV     circumflex was previously dilated.  This has segmental plaquing of about     50-60%.  5. The right coronary artery is a fairly large caliber vessel.  In the mid     portion of the vessel, there is probably 50-60 and potentially even up to     70% narrowing in the mid vessel.  It is slightly hypodense and moderately     calcified.  This is just after the take off of a right ventricular and     probably SA nodal branch.  The vessel beyond this divides into a PDA and     three posterior lateral branches, all of which are free of critical     disease.   Ventriculography in the RAO projection reveals vigorous global systolic  function with an ejection fraction that exceeds 75%.   There is slight  proximal dilatation of the aortic root.   CONCLUSIONS:  1. Vigorous global systolic function.  2. 50-60% narrowing in the previous balloon site involving the AV     circumflex.  3. 70% narrowing of the diagonal which likely accounts for the patient's     anterolateral ischemia.  4. Segmental plaquing of the left anterior descending artery of moderate     degree.  5. Moderate stenosis of the right coronary artery with probable slight     progression from previous study.   DISPOSITION:  The patient's ischemia by Cardiolite is in the anterolateral  territory.  It does not involve the inferior or right coronary territory by  Cardiolite.  Furthermore, the patient has no chest pain nor EKG changes with  exercise.  Based on this, we would likely lean towards medical therapy, but  percutaneous coronary intervention of the right coronary artery could be  considered.  I will review this with my colleagues.                                               Arturo Morton. Riley Kill, M.D.    TDS/MEDQ  D:  05/17/2003  T:  05/18/2003  Job:  [id number]   cc:   Diamond [name], M.D.  [address]  [place]  [place] [address]  Fax: [phone]   Diamond [name]  [address]  [place]  [place] [address]  Fax: [phone]

## 2010-08-14 NOTE — Op Note (Signed)
NAME:  Diamond [name], Diamond [name] NO.:  [id number]   MEDICAL RECORD NO.:  [id number]                   PATIENT TYPE:  INP   LOCATION:  2302                                 FACILITY:  [facility]   PHYSICIAN:  Salvatore Decent. Dorris Fetch, M.D.         DATE OF BIRTH:  01-08-1941   DATE OF PROCEDURE:  06/03/2003  DATE OF DISCHARGE:                                 OPERATIVE REPORT   PREOPERATIVE DIAGNOSIS:  Two vessel disease with unstable angina.   POSTOPERATIVE DIAGNOSIS:  Two vessel disease with unstable angina.   PROCEDURE:  Median sternotomy, extracorporeal circulation, coronary artery  bypass grafting x4 (left internal mammary artery to the LAD, saphenous vein  graft to first diagonal, sequential saphenous vein graft to distal right  coronary artery and posterior descending).   SURGEON:  Salvatore Decent. Dorris Fetch, M.D.   ASSISTANTEber Jones A. Eustaquio Boyden.   ANESTHESIA:  General.   FINDINGS:  LAD small and intramyocardial, not suitable for off-pump  grafting.  First diagonal also within epicardial fat. These were both fair  quality targets.  The distal coronary and posterior descending good quality  targets.  Good quality saphenous vein. Excellent flow through left internal  mammary artery.   INDICATIONS FOR PROCEDURE:  The patient is a 70 year old female with a  history of severe atherosclerotic cardiovascular disease and history of  multiple previous cerebrovascular accidents.  She presented with unstable  angina and at cardiac catheterization, had two vessel disease.  The patient  had been on Plavix, therefore surgery was delayed to allow the Plavix to  wear off prior to proceeding with surgery to decrease the excessive bleeding  risk associated with ongoing Plavix use prior to the operation.  The  indications, risks, benefits, and alternative procedures were discussed in  detail with the patient.  Because of her risk of previous strokes, it was  discussed as an  option to do off-pump grafting which might decrease her risk  of stroke, however, there was concern with the LAD being suitable and the  patient was advised of this preoperatively and understood that  cardiopulmonary bypass would be the backup option if difficulties were  encountered.   DESCRIPTION OF PROCEDURE:  The patient was brought to the preoperative  holding area on June 03, 2003.  Lines were placed to monitor arterial,  central venous, and pulmonary arterial pressure.  Intravenous antibiotics  were administered.  She was taken to the operating room, anesthetized, and  intubated.  A Foley catheter was placed.  The chest, abdomen, and legs were  prepped and draped in the usual sterile fashion.  A right femoral arterial  line was placed for blood pressure monitoring as the patient had not been  able to have a radial or brachial line placed by anesthesia.  The right  greater saphenous vein then was harvested using a standard technique and  incision was made in the medial aspect of  the right leg.  The greater  saphenous vein was identified and then harvested endoscopically from the  right thigh.  The vein was of good quality.   A median sternotomy was performed and the left internal mammary artery was  harvested using standard technique.  Because of the patient's previous  carotid subclavian bypass, it was unsure if the mammary artery would be a  suitable vessel for grafting.  The vessel appeared to be normal with  takedown.  The patient was fully heparinized prior to dividing the distal  end of the mammary artery.  There was excellent flow through the vessel and  the vessel was of adequate size and caliber with no significant  atherosclerotic disease.  Therefore, the decision was made to use the vessel  as a pedicle graft.   The pericardium was opened.  The ascending aorta was inspected and palpated.  There was no palpable atherosclerotic disease.  A pericardial cradle was   created.  The acute margin of the heart was gently retracted exposing the  distal right coronary and posterior descending.  These vessels were suitable  for grafting in an off-pump fashion.  The heart was gently lifted and a  sponge was placed behind the heart to inspect the LAD and diagonal.  The  patient tolerated this well hemodynamically.  The LAD was a small vessel and  unfortunately was intramyocardial and a deep trough of epicardial fat with a  thin layer of myocardium over top of the vessel.  This vessel was not  suitable for attempted grafting in off-pump fashion.  The decision was made  to proceed with grafting on pump on an elective basis.  At this point in  time, the patient did not have any ischemic event.   The aorta then was cannulated via concentric 2-0 Ethibond pledgeted  pursestring sutures.  The dual stage venous cannula was placed via a  pursestring suture in the right atrial appendage.  Cardiopulmonary bypass  was instituted and the patient was cooled to 32 degrees Celsius.  The  coronary arteries were inspected and anastomotic sites were chosen.  The  conduits were inspected and cut to length.  The foam pad was placed in the  pericardium to protect the left phrenic nerve.  A temperature probe was  placed in the myocardial septum and a cardioplegia cannula was placed in the  ascending aorta.   The aorta was crossclamped. The left ventricle was emptied via the aortic  root vent.  Cardiac arrest then was achieved with a combination of cold  antegrade blood cardioplegia and topical ice saline.  After achieving a  complete diastolic arrest and myocardial septal temperature of 9 degrees  Celsius, the following distal anastomoses were performed.   First a reversed saphenous vein graft was placed sequentially to the distal  right coronary artery and the posterior descending.  A side-to-side anastomosis was performed to the distal right coronary artery just beyond  the  takeoff of the posterior descending. This was done off a side branch of  the vein graft and it was performed with a running 7-0 Prolene suture.  This  anastomosis and all of the others were probed proximally and distally prior  to tying the sutures to confirm patency.  The graft was flushed.  There was  excellent flow through the anastomosis.  The distal end was cut to length  and then anastomosed to the posterior descending which was a 1.5 mm good  quality target.  This anastomosis also  was performed with a running 7-0  Prolene suture.  The graft flushed easily. Cardioplegia was administered.  There was good hemostasis at both anastomoses.   Next, a reversed saphenous vein graft was placed end-to-side to the first  diagonal branch of the LAD.  This was a 1.5 mm fair quality vessel.  It was  very tortuous distally, but a probe did pass up to the point of the  tortuosity.  The vein graft was smaller caliber, but good quality.  It was  anastomosed end-to-side with a running 7-0 Prolene suture.  There was  excellent flow through this graft.  Cardioplegia was administered and again  there was good hemostasis at the anastomosis.   Next, the left internal mammary artery was brought through a window in the  pericardium and the distal end was spatulated and then was anastomosed end-  to-side to the mid LAD which was an intramyocardial vessel.  A 1.5 mm probe  passed proximally, but only a 1 mm probe would pass distally.  It was a fair  quality vessel.  The anastomosis was performed end-to-side with a running 8-  0 Prolene suture.  Again, this anastomosis was probed proximally and  distally prior to tying the suture.  At completion of the anastomosis, the  Bulldog clamp was removed from the mammary artery.  Immediate and rapid  septal rewarming was noted.  The Bulldog clamp was replaced.  The mammary  pedicle was tacked to the epicardial surface of the heart with 6-0 Prolene  sutures.  Additional  cardioplegia then was administered down the vein grafts  and the aortic root.   The vein grafts were cut to length.  The cardioplegia cannula was removed  from the ascending aorta and the proximal vein graft anastomoses were  performed to 4.0 mm punch aortotomies with a running 6-0 Prolene sutures  while under crossclamp.  At completion of the final proximal anastomosis,  the patient was placed in Trendelenburg position.  The heart was filled with  blood.  The bulldog clamp was removed from the left mammary artery and  immediate and rapid septal rewarming was once again noted.  The lungs were  inflated and the aortic root was deaired.  The aortic crossclamp then was  removed.  The total crossclamp time was 68 minutes.  Residual air was  aspirated from the vein grafts before removing the Bulldog clamps.  The  patient spontaneously resumed a rhythm and did not require defibrillation. All proximal and distal anastomoses were inspected for hemostasis while  rewarming.  Epicardial pacing wires were placed on the right ventricle and  right atrium.   After the patient had been rewarmed to a core temperature of 37 degrees  Celsius, she was weaned from cardiopulmonary bypass without difficulty.  She  was DVD paced for rate, but on no inotropic support at the time of  separation from bypass.  The total bypass time was 106 minutes.   Test dose of Protamine was administered and was well tolerated.  The atrial  and aortic cannuli were removed.  The remainder of the Protamine was  administered without incident.  The chest was irrigated with 1 liter of warm  normal saline containing 1 gram of Vancomycin.  Hemostasis was achieved.  A  left pleural and two mediastinal chest tubes were placed through separate  subcostal incisions.  The pericardium was reapproximated with interrupted 3-  0 silk sutures that came together easily without tension or kinking of the  underlying grafts.  The sternum was closed  with interrupted heavy gauge  stainless steel wires.  The pectoralis fascia was closed with a running #1  Vicryl suture.  The remainder of the incision was closed in the standard  fashion.  Subcuticular closure was used on the skin.  The patient did have a  transient drop in cardiac output after closure of the chest.  This responded  appropriately to volume resuscitation.  She then was transported from the  operating room to the surgical intensive care unit in critical, but stable  condition.  All needle, sponge, and instrument counts correct at the end of  the procedure.                                               Salvatore Decent Dorris Fetch, M.D.    SCH/MEDQ  D:  06/03/2003  T:  06/04/2003  Job:  [id number]   cc:   Arturo Morton. Riley Kill, M.D.   Aundra Dubin. Revankar, M.D.  [address]  [place]  [place] [address]  Fax: [phone]   Philemon Kingdom  [address]  [place]  [place] [address]  Fax: [phone]

## 2010-08-14 NOTE — Discharge Summary (Signed)
NAME:  Diamond [name], Diamond [name] NO.:  [id number]   MEDICAL RECORD NO.:  [id number]                   PATIENT TYPE:  OIB   LOCATION:  6532                                 FACILITY:  [facility]   PHYSICIAN:  Charlies Constable, M.D. [facility]              DATE OF BIRTH:  1940/05/23   DATE OF ADMISSION:  05/04/2002  DATE OF DISCHARGE:  05/05/2002                           DISCHARGE SUMMARY - REFERRING   BRIEF HISTORY:  This is a 70 year old female with no known coronary artery  disease, who was evaluated by Dr. Wille Glaser for dyspnea on exertion.  The  patient had an Adenosine Cardiolite that was consistent with lateral  ischemia.  She was referred to Dr. Juanda Chance for further evaluation.   PAST MEDICAL HISTORY:  Significant for hypertension, elevated lipids,  history of cerebrovascular accidents times 3, mitral regurgitation,  peripheral vascular disease with left carotid artery stenosis of 60-70%,  gastroesophageal reflux disease, and  peptic ulcer disease.   ALLERGIES:  PATIENT IS ALLERGIC TO CODEINE AND SHE IS ALSO ALLERGIC TO  CONTRAST DYE.   SOCIAL HISTORY:  Patient has a remote tobacco history.  She quit smoking in  2001.  She lives in [place].   FAMILY HISTORY:  The patient's mother died at age 45 from cerebrovascular  accident.  Father died in his 85s from myocardial infarction.   HOSPITAL COURSE:  As noted, this patient was admitted to [facility]  for further evaluation of dyspnea on exertion with an abnormal Cardiolite.  She underwent cardiac catheterization on 05/04/2002 performed by Dr. Charlies Constable.  She was found to have a 95% stenosis of the circumflex artery.  A  percutaneous transluminal coronary angioplasty was performed reducing the  stenosis to approximately 20%.  She had a residual 50% diagonal, as well as  a 40% right coronary artery.  Her ejection fraction was estimated to be 60%.  The patient tolerated the procedure well. Arrangements were made  to  discharge her the following day  in improved and stable condition.   LABORATORY DATA:  A CBC on the date of discharge revealed hemoglobin 11.1,  hematocrit 32.4, WBCs 12,000, platelets 263,000, BUN 15, creatinine 0.9,  potassium 4.2, sodium 134, glucose 140.  Cardiac enzymes were negative.   DISCHARGE MEDICATIONS:  Patient was told to continue her previous  medications, this included:  1. Atenolol 25 mg daily.  2. Diovan 80 mg daily.  3. Plavix 75 mg at bedtime.  4. Cenestin 1.25 mg frequency not available.  5. Aspirin 325 mg daily.  6. Also listed as being on atenolol 50 mg daily.  7. Protonix 40 mg daily.  8. Naprosyn 500 gm b.i.d. p.r.n.  9. Pravachol 20 mg daily.  10.      Trihexyphenidyl 2 mg at bedtime p.r.n.   DISCHARGE INSTRUCTIONS:  The patient was told to take Tylenol as needed for  pain.  She was to avoid any strenuous activity or driving for at least two  days.  She was told not to lift more than 10 pounds for one week.  She was  to be on a low salt, low fat diet.  She was told to call the office if she  had any increased pain, swelling, or bleeding from the groin.  She was to  follow up with Dr. Wille Glaser in one to two weeks and Dr. Myrtie Cruise as needed  or as scheduled.   PROBLEM LIST AT TIME OF DISCHARGE:  1. Coronary artery disease with percutaneous transluminal coronary     angioplasty of the circumflex performed 2.6.2004, reducing the stenosis     from 95% to 20%.  2. Residual 40% right coronary artery and 50% diagonal.  3. Ejection fraction 60%.  4. History of hypertension.  5. History of elevated lipids.  6. History of cerebrovascular accident times 3.  7. History of mitral regurgitation.  8. Peripheral vascular disease.  9. Gastroesophageal reflux disease and peptic ulcer disease.  10.      Remote tobacco history.  11.      Status post cholecystectomy and hysterectomy.  12.      DYE ALLERGY.     Delton See, P.A. [facility]                  Charlies Constable, M.D. [facility]    DR/MEDQ  D:  05/05/2002  T:  05/05/2002  Job:  [id number]   cc:   Wille Glaser, MD  Jamse Mead, MD  [place]

## 2010-08-14 NOTE — Discharge Summary (Signed)
NAME:  Diamond [name], BARDIN NO.:  [id number]   MEDICAL RECORD NO.:  [id number]                   PATIENT TYPE:  INP   LOCATION:  3303                                 FACILITY:  [facility]   PHYSICIAN:  Di Kindle. Edilia Bo, M.D.        DATE OF BIRTH:  [birth year]   DATE OF ADMISSION:  11/22/2002  DATE OF DISCHARGE:  11/23/2002                                 DISCHARGE SUMMARY   DATE OF SURGERY:  November 22, 2002.   ADMISSION DIAGNOSIS:  Left subclavian stenosis.   PAST MEDICAL HISTORY:  1. Significant for multiple cerebrovascular accidents, resulting in     confusion and dizziness, expressive aphasia, right lower extremity     weakness and right hemiparesis with fears.  2. Coronary artery disease for which the patient had a catheterization and a     PTA stent in April 2004.  3. Hypertension.  4. Fracture of the left foot, approximately four weeks prior to admission.     The patient was in a soft foot immobilizer and was seen by [facility] [place].   ALLERGIES:  1. The patient has an intolerance to IV DYE and  2. CODEINE which results in vomiting and blackout.   DISCHARGE DIAGNOSES:  Left subclavian stenosis.   BRIEF HISTORY:  Ms. Clemson is a 70 year old Caucasian female referred, from  Dr. Vickey Huger, for the evaluation of carotid disease.  Ms. Jonsson was first  seen by Dr. Edilia Bo in April 2004 at which time he ordered a carotid duplex.  The duplex showed a 50% right internal carotid artery stenosis and a 50-70%  left internal carotid artery stenosis.  At that time, Dr. Edilia Bo thought  the patient may be a candidate for left carotid endarterectomy; however, he  first recommended a cerebral arteriogram to further assess the left carotid  stenosis, as it was only moderate by duplex.  Dr. Edilia Bo also wanted to  further evaluate her left subclavian stenosis noted by duplex scan with  evidence of subclavian __________ .  The arteriogram showed  that in fact the  patient had only a very mild stenosis in the left carotid artery.  For this  reason, Dr. Edilia Bo decided not to proceed with the left carotid  endarterectomy.  The patient has suffered progressive dizziness since April  and therefore, the left subclavian occlusion was addressed.  The patient  stated that in July 2004, a radiologist attempted to cross this occlusion  however, this attempt was unsuccessful; therefore, she was sent back to Dr.  Edilia Bo for consideration of the carotid subclavian bypass.   HOSPITAL COURSE:  On November 22, 2002, the patient was taken to the OR for a  left carotid subclavian bypass with a 6-mm Dacron.  The patient was  hemodynamically stable immediately post-op, was extubated without problem  and woke up from anesthesia neurologically intact.  On the date of  discharge,  the patient was without complaint.  Temperature was 97.5, BP of  108/58, and oxygen saturation of [99]%.  The incision was healing well and  there was a palpable left radial pulse.  The patient was neurologically  intact and doing well.  The patient did complain of dizziness when  mobilized.  A normal saline bolus of 500 cc was given.  After the bolus, the  patient was assisted out of bed and ambulated in the hallway.  There was no  further dizziness noted.   LABS:  Labs remained stable.  On November 23, 2002:  White count was 6.4,  hemoglobin 10.9, hematocrit 31.8, platelets 327.  BMP, on November 23, 2002,  sodium 137, potassium 4.2, BUN 22, creatinine 1.3, glucose 100.   CONDITION ON DISCHARGE:  Stable and improved.   INSTRUCTIONS:   MEDICATIONS:  1. Pravachol 40 mg, one p.o. b.i.d.  2. Trihexyphenidyl 2 mg, one p.o. p.r.n.  3. Plavix 75 mg, one p.o. every day.  4. Naproxen 500 mg, one p.o. b.i.d.  5. Cenestin 1.25 mg, one p.o. p.r.n.  6. Protonix 40 mg, one p.o. every day.  7. Aspirin 325 mg, one p.o. every day.  8. Tenormin 25 mg p.o. b.i.d. to be started on November 24, 2002.   PAIN MANAGEMENT:  Tylox 1-2 q.4-6h. p.o. p.r.n. for pain.   DIET:  Heart healthy.   WOUND CARE:  1. Clean incisions with soap and water.  2. No creams or lotions on the incision.  3. If the incision should become red, swollen, painful, or have discharge,     or if the patient has a fever of 101, they are to call [facility] and speak to     the nurses.   SPECIAL INSTRUCTIONS:  Call if the patient has any problems or questions.   FOLLOWUP:  Dr. Edilia Bo would like to see the patient in two weeks.  The  appointment was set for Wednesday, December 05, 2002, at 10 a.m.      Pecola Leisure, PA                      Di Kindle. Edilia Bo, M.D.    AY/MEDQ  D:  11/23/2002  T:  11/25/2002  Job:  [id number]   cc:   Di Kindle. Edilia Bo, M.D.  [address]  [place]  [place] [address]  Fax: [phone]   Melvyn Novas, M.D.  [address]  [address]  [place]  [place] [address]  Fax: [phone]

## 2010-08-14 NOTE — Op Note (Signed)
NAME:  Diamond [name], SITU NO.:  [id number]   MEDICAL RECORD NO.:  [id number]                   PATIENT TYPE:  INP   LOCATION:  2302                                 FACILITY:  [facility]   PHYSICIAN:  Sheldon Silvan, M.D.                   DATE OF BIRTH:  [birth year]   DATE OF PROCEDURE:  06/03/2003  DATE OF DISCHARGE:                                 OPERATIVE REPORT   PROCEDURE PERFORMED:  Intraoperative transesophageal echocardiography (TEE).   INDICATIONS FOR PROCEDURE:  [name] was brought to the operating room today  by Viviann Spare C. Dorris Fetch, M.D.  She was brought originally for coronary  artery bypass grafting.  This was performed successfully and she was taken  to the surgical intensive care unit in good condition.  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  Her  pressure dropped and Dr. Dorris Fetch was at the bedside.  This was treated  appropriately and her rhythm was restored; however, he felt it was  appropriate to return her to the operating room for exploration of her chest  to make certain there were no obvious reasons for the fibrillation.  It was  felt that use of TEE would be appropriate for monitoring and diagnostic  purposes during this time.  She was returned to the operating room while  still under general anesthesia and while still intubated.  Once she was on  the operating table and all monitors were secured, the [employer] TEE probe was passed easily through the oropharynx atraumatically.   The heart was imaged without difficulty.  The left ventricle was seen and  was noted to contract well in all four wall segments.  There were no areas  of hypokinesis noted.  The mitral valve was seen to be structurally intact  as were the chordae.  The Color Flow exam showed trace to 1+ regurgitation  centrally.  The  aortic valve was examined and found to be tricuspid in  nature with very little sclerosis on the cusp edges.  The approximation of  the cusp was adequate.  In the long axis view and under Color Flow exam  there was no regurgitation noted and no stenosis.   The tricuspid valve appeared normal in both structure and function with  Color Flow.  The left atrial appendage was noted to not have thrombus or  smoke.  The interatrial septum was imaged and on color exam showed no PFO.   The right ventricle functioned normally.  After re-exploration of the chest  on closing the chest, two separate examinations were performed of the left  ventricle.  Just prior to closing by wires, the ventricle seemed unchanged.  After complete closure was obtained, the ventricle was re-examined and the  contractility was unchanged as well.  She was returned to the SICU in good  condition after removal of the TEE probe.                                               Sheldon Silvan, M.D.    DC/MEDQ  D:  06/03/2003  T:  06/04/2003  Job:  [id number]

## 2010-08-14 NOTE — Discharge Summary (Signed)
NAME:  Diamond [name], Diamond [name] NO.:  [id number]   MEDICAL RECORD NO.:  [id number]                   PATIENT TYPE:  OIB   LOCATION:  2037                                 FACILITY:  [facility]   PHYSICIAN:  Veneda Melter, M.D.                   DATE OF BIRTH:  15-Jul-1940   DATE OF ADMISSION:  05/17/2003  DATE OF DISCHARGE:  05/19/2003                           DISCHARGE SUMMARY - REFERRING   DISCHARGE DIAGNOSES:  1. Coronary artery disease.  2. History of stroke x3.  3. Hypertension.  4. Dyslipidemia treated.  5. History of mild mitral regurgitation by echo.  6. History of tobacco abuse, quit 5 years ago.  7. History of peptic ulcer disease.  8. Status post hysterectomy.  9. Status post cholecystectomy.   Diamond [name] is a 70 year old patient of Dr. Tomie [name] with a past medical  history significant for coronary artery disease, in 2004 she underwent an  angioplasty of the circumflex reducing the lesion from a 95% lesion to a 20%  lesion post procedure, recently she has noticed dyspnea on exertion as well  as chest pain.  She did see Dr. Tomie [name], underwent a rest/stress Cardiolite  that revealed anterolateral ischemia with a normal ejection fraction.  She  was then referred for cardiac catheterization.   Cardiac catheterization on May 17, 2003 revealed normal EF, the right  coronary artery had a 60% early mid stenosis, the circumflex had a 50%  stenosis at the old PTCA site, the LAD had a 50-60% stenosis at the site of  the first diagonal with a 70% lesion at the ostial to diagonal, at this  point Dr. Riley [name] wanted to review the films with the colleagues and felt  that the patient could go home and he would call her later next week with a  decision of whether or not this needed to be treated.  Unfortunately she had  to stay overnight because of oozing from the left groin site, there was no  bruit at that area, there was no ecchymosis, the area did stop with  pressure.  She was ambulated without difficulty and she has been discharged  to home.   The only other problem during her hospitalization was a brief episode of 6  seconds of atrial fibrillation, the patient was asymptomatic and she never  complains of any palpitations.  It may be prudent for her to wear a Holter  monitor upon discharge.   Lab studies include a sodium of 135, a potassium of 3.9, BUN 21, creatinine  1.9, hemoglobin 11, hematocrit 33, white count 10.1, platelets 340.   At this point she is to continue her same home medications which include  Plavix 75 mg a day, enteric-coated aspirin 325 mg a day, Diovan 80 mg a day,  atenolol 50 mg one half tablet p.o. b.i.d., Pravachol 80 mg a day, Cenestin  1.25 mg a day,  Protonix 40 mg a day, and Artane 2 mg at bedtime.  Tylenol  will be okay to take if she needs this for pain.  May utilize sublingual nitroglycerin as needed for chest pain.  No strenuous  activity or lifting until seen in the office by Dr. Tomie [name].  Remain on a  low fat diet.  Clean over calf site with soap and water, no scrubbing.  Call  for questions or concerns at [phone].  Tomorrow she needs to call Dr.  Kem [name] office for groin followup this week.      Diamond [name], P.A. [facility]                      Veneda Melter, M.D.    LB/MEDQ  D:  05/19/2003  T:  05/19/2003  Job:  [id number]   cc:   Diamond [name], M.D.  [address]  [place]  [place] [address]  Fax: [phone]   Diamond [name]  [address]  [place]  [place] [address]  Fax: [phone]

## 2010-08-14 NOTE — H&P (Signed)
NAME:  Diamond [name], Diamond [name] NO.:  [id number]   MEDICAL RECORD NO.:  [id number]                   PATIENT TYPE:  INP   LOCATION:  NA                                   FACILITY:  [facility]   PHYSICIAN:  Di Kindle. Edilia Bo, M.D.        DATE OF BIRTH:  07-Jun-1940   DATE OF ADMISSION:  11/22/2002  DATE OF DISCHARGE:                                HISTORY & PHYSICAL   PRIMARY CARE PHYSICIAN:  Rayfield Citizen C. Sudie Bailey, M.D., [facility], [place]   NEUROLOGIST:  Melvyn Novas, M.D.   CHIEF COMPLAINT:  Left subclavian stenosis.   HISTORY OF PRESENT ILLNESS:  This patient is a 70 year old Caucasian female  referred from Dr., Dohmeier for evaluation of her carotid disease.  She was  initially seen by Dr. Edilia Bo on July 11, 2002.  A carotid duplex done that  day at the [facility] office revealed a 50% right internal carotid artery stenosis  and a 50-70% left internal carotid artery stenosis.  Her systolic blood  pressure was noted to be 148 mmHg on the right, 98 mmHg on the left.  Dr.  Edilia Bo recommended cerebral angiogram for further evaluation before  recommending left carotid endarterectomy.  Her arteriogram revealed only  mild left internal carotid artery stenosis, so left carotid endarterectomy  was not performed.   She returned to the [facility] office in August 2004.  Over the four months  between visits, she has been experiencing progressive dizziness.  She  returned for consideration of treatment of her left subclavian stenosis.  In  July 2004, Radiology attempted to cross the stenosis; however, this was  unsuccessful.  Dr. Edilia Bo, again, reviewed her arteriogram and examined Ms.  [name].  He then recommended proceeding with a left carotid subclavian bypass.  The procedure, risks, and benefits were discussed with Diamond [name].  She agreed  to proceed with surgery.  She is here today for preoperative history and  physical examination.   PAST  MEDICAL HISTORY:  1. Multiple CVAs, first in 1995, with a right hemiparesis and seizures.     Next 2001, with expressive aphasia and right lower extremity weakness.     Her seizure activity ceased after this stroke.  2. She also had TIAs in 2003, with some confusion and dizziness.  3. Coronary artery disease, status post catheterization and angioplasty of     single-vessel disease by Dr. Juanda Chance in April 2004.  4. Hypertension.  5. She has had a recent fracture of her left lateral foot approximately four     weeks ago.  She is being care for by [facility] in [place].  6. She specifically denies any history of diabetes, kidney disease, or     thyroid disease.   ALLERGIES:  She says that she had a reaction to IV DYE many years ago.  The  new dye is okay.  She is also intolerant of CODEINE;  it causes immediate  vomiting and blackout.  She has used Percocet in the past without  difficulty.   ADMISSION MEDICATIONS:  1. Cenestin 1.25 mg daily.  2. Atenolol 25 mg q.a.m., 50 mg q.p.m.  3. Pravachol 20 mg b.i.d.  4. Diovan 80 mg daily.  5. Enteric-coated aspirin 325 mg daily.  6. Plavix 75 mg daily.  She stopped this on August 20.  7. Protonix 40 mg daily.  8. Naproxen 500 mg b.i.d.  9. Trihexyphenidyl 2 mg at bedtime for leg cramps.   SOCIAL HISTORY:  Diamond [name] is a widow.  She has one daughter.  She stopped  smoking cigarettes about three years ago; prior to that she smoked a pack a  day for about 40 years.  She does not drink alcohol.  She lives with her  daughter.  She is able to drive.  She will have assistance available from  her daughter and multiple friends after discharge from the hospital.   FAMILY HISTORY:  Her mother had strokes, as well as hypertension.   REVIEW OF SYSTEMS:  GENERAL:  Diamond [name] feels her health is okay.  No recent  changes in her vision or hearing.  No difficulty chewing or swallowing.  She  does wear eyeglasses.  She does report some seasonal  allergies and sinus  problems.  PULMONARY:  No dyspnea on exertion, no shortness of breath at  rest, no cough.  CARDIAC:  She does report some occasional chest pain.  Has  not used nitroglycerin for relief up until now.  Also, occasional heart  fluttering.  GASTROINTESTINAL:  She has reported a ten pound weight gain  over the last four months; this is a result of decreasing her activity  secondary to her dizziness.  No regurgitation symptoms.  No change in her  bowel habits.  Her appetite is good.  GENITOURINARY:  No dysuria, no  incontinence.  NEUROLOGICAL:  She says her dizziness is pretty bad.  She can  only walk short distances before becoming dizzy.  She did faint  approximately two months ago.  No recent falls.  She ambulates  independently.  No symptoms of depression or anxiety.  Reports that she  sleeps pretty well.  VASCULAR:  She does report some mild right calf  claudication after quite a distance of walking.  She does have right leg  cramps at night.  Of note, she is wearing a soft foot immobilizer which is  okay to remove for sleeping and bathing.  No recent fevers.  She does bruise  easily.  She does not have a history of blood clots.   PHYSICAL EXAMINATION:  VITAL SIGNS:  Blood pressure on right 142/82, on left  98/70, heart rate 56, height 5 feet 1.5 inches, weight approximately 172  pounds.  GENERAL:  This is a 70 year old Caucasian female in no acute distress.  HEENT:  PERRLA, EOMI.  Oral mucosa is pink and moist.  NECK:  Full range of motion.  She had mild left bilateral carotid bruits.  No thyromegaly, no lymphadenopathy.  LUNGS:  Breathing is unlabored.  Her breath sounds are clear bilaterally.  HEART:  Irregular rate and rhythm.  ABDOMEN:  Rotund.  She has positive bowel sounds.  Soft and nontender.  EXTREMITIES:  Left foot and lower leg are encased in the soft immobilizer. Her right foot is well perfused.  She has no right lower extremity edema or  varicosities.   NEUROLOGICAL:  She is alert and oriented.  Cranial  nerves II-XII are grossly  intact.  Her gait is steady.  She has full range of motion of all her  extremities.  Her muscle strength is 3/5 in all of her extremities.   LABORATORY DATA:  Pending.  They will include a CBC, BMET, PT/INR.   IMPRESSION:  This is a 70 year old female with symptomatic left subclavian  stenosis.    PLAN:  Elective admission to [facility] under the care of Dr.  Edilia Bo on August 26, for left subclavian bypass.      Toribio Harbour, N.P.                  Di Kindle. Edilia Bo, M.D.    CTK/MEDQ  D:  11/20/2002  T:  11/20/2002  Job:  [id number]

## 2010-08-14 NOTE — Cardiovascular Report (Signed)
NAME:  Diamond, [name] NO.:  [id number]   MEDICAL RECORD NO.:  [id number]                   PATIENT TYPE:   LOCATION:                                       FACILITY:   PHYSICIAN:  Charlies Constable, M.D. [facility]              DATE OF BIRTH:  [birth year]   DATE OF PROCEDURE:  05/04/2002  DATE OF DISCHARGE:                              CARDIAC CATHETERIZATION   PROCEDURES PERFORMED:  Cardiac catheterization and percutaneous coronary  intervention.   CLINICAL HISTORY:  The patient is 70 years old and has hypertension,  nonobstructive carotid disease with a history of a previous stroke and  hyperlipidemia.  She has had smothering sensation and chest tightness with  exertion, and she had a Cardiolite scan which showed lateral ischemia. Dr.  Tomie China arranged for her to come in for catheterization.   DESCRIPTION OF PROCEDURE:  The procedure was performed via the right femoral  artery using an arterial sheath and 6 French preformed coronary catheters.  A front wall arterial puncture was performed and Omnipaque contrast was  used.  After completion of the diagnostic study, we made a decision to  proceed with intervention on the circumflex artery. The patient was given  Angiomax bolus and infusion and was given 300 mg of Plavix.  We used a CLS  3.5 6 [place] guiding catheter with side holes and a short Graphic PT wire.  We crossed the lesion with the wire with some difficulty.  We then dilated  with a 2.0 x 20 mm Maverick performing a total of four inflation up to 5  atmospheres for 1 minute.  Repeat diagnostic studies were then performed  through the guiding catheter. The patient tolerated the procedure well and  left the laboratory in satisfactory condition.   RESULTS:  1. The left main coronary artery:  The left main coronary artery was free of     significant disease.  2. Left anterior descending:  The left anterior descending artery gave rise     to two  diagonal branches and two septal perforators.  There was 50%     ostial stenosis of the first diagonal branch.  3. Circumflex artery:  The circumflex artery gave rise to a marginal branch,     posterolateral branch, and a small AV branch. There was 95% stenosis in     the posterolateral branch.  4. Right coronary artery:  The right coronary is a moderately large vessel     that gave rise to two right ventricular branchs, a posterior descending     branch and three posterolateral branches.  There was 40% narrowing in the     mid vessel.   LEFT VENTRICULOGRAM:  The left ventriculogram performed in the RAO  projection showed good wall motion with no areas of hypokinesis.  The  estimated ejection fraction was 60%.   DISTAL AORTOGRAPHY:  A distal aortogram  was performed which showed patent  renal arteries.  The distal aortoiliac area was calcified, but we did not  have enough contrast to visualize this very well.   Following PTCA of the lesion in the circumflex and posterolateral branch,  the stenosis improved from 95% to less than 20%.   CONCLUSIONS:  1. Coronary artery disease with 50% narrowing in the diagonal branch of the     left anterior descending, 95% stenosis in the posterolateral branch of     the circumflex artery, 40% narrowing in the mid right coronary artery and     normal left ventricular function.  2. Successful balloon angioplasty of the circumflex lesion with improvement     in percent diameter narrowing from 95% to less than 20%.   DISPOSITION:  The patient was returned to the postangioplasty unit for  further observation.                                                    Charlies Constable, M.D. [facility]    BB/MEDQ  D:  05/04/2002  T:  05/05/2002  Job:  [id number]   cc:   Aundra Dubin. Revankar, M.D.  [address]  [place]  [place] [address]  Fax: [phone]   Philemon Kingdom  [address]  [place]  [place] [address]  Fax: [phone]   Cardiopulmonary Laboratory

## 2010-08-14 NOTE — Consult Note (Signed)
NAME:  Diamond [name], Diamond [name] NO.:  [id number]   MEDICAL RECORD NO.:  [id number]                   PATIENT TYPE:  INP   LOCATION:  6524                                 FACILITY:  [facility]   PHYSICIAN:  Marlan Palau, M.D.               DATE OF BIRTH:  [birth year]   DATE OF CONSULTATION:  05/27/2003  DATE OF DISCHARGE:                                   CONSULTATION   HISTORY OF PRESENT ILLNESS:  Diamond [name] is a 70 year old right-handed  white female born 04-25-1940 with a history of cerebrovascular disease,  subclavian steel syndrome.  The patient has been followed by Dr. Porfirio Mylar  Dohmeier for cerebrovascular disease.  Underwent catheterization of the  cerebrovascular vessels in April of 2004.  The patient was found to have a  subclavian steel syndrome.  The right coronary artery filled the basilar  system through the posterior communicating vessel and left internal carotid  artery was found to have 25% artery stenosis at the carotid bulb.  The  patient had a carotid doppler study in August of 2004 prior to a subclavian  bypass procedure to prevent subclavian steel and an approximate 50-70% left  temporal carotid artery stenosis was noted.  Left carotid endarterectomy was  not done at that time due to results of the prior angiogram study.  The  patient comes in at this point with symptoms of coronary artery disease.  Repeat carotid doppler study has shown evidence of 60% stenosis of the left  internal carotid artery, 40-60% of the right internal carotid artery.  The  patient has had no symptoms referrable to subclavian steel since the surgery  in August of 2004.  Has had no TIA-like symptoms for over a year and a half.  Neurology is asked to see this patient for further evaluation.   The patient had been on Plavix prior to admission.   PAST MEDICAL HISTORY:  1. Significant for history of cerebrovascular disease.  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  2. 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  3. History of known carotid vessel disease as above.  4. History of coronary artery disease.  5. History of gallbladder surgery.  6. Status post appendectomy.  7. Status post hysterectomy.  8. Obesity.  9. Hypercholesterolemia.  10.      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  Is not     on anticonvulsant therapy.  11.      History of foot fracture in the past.   MEDICATIONS:  1. Plavix 75 mg q.d.  2. Protonix 40 mg q.d.  3. Mobic 7.5 mg 1-2 q.d.  4. Atenolol 50 mg 1/2 tablet b.i.d.  5. Diovan 80 mg q.d.  6. Enteric-coated aspirin 325 mg q.d.  7. Pravachol 80  mg q.h.s.   ALLERGIES:  The patient states an allergy to IVP DYE and CODEINE.   SOCIAL HISTORY:  Does not currently smoke.  Quit five years ago.  Drinks  alcohol on occasion.  This patient is widowed.  [place]  Has one daughter who is alive and well.  The patient is on  disability/retired.   FAMILY HISTORY:  Notable that mother died with a stroke at age 15.  Father  died with a MI at age 72.  The patient has two brothers and four sisters,  all alive and well.   REVIEW OF SYMPTOMS:  Notable for occasional chills.  The patient has  occasional headaches.  Headaches may last up to two days at a time.  The  patient denies any visual field changes.  Has had some trouble with work  finding on and off.  She claims occasional neck pain.  Does note some  shortness of breath with her current symptom complex.  Denies any overt  chest pain.  Denies any nausea, vomiting, abdominal pain.  Denies falls or  trouble controlling bowels or bladder.   PHYSICAL EXAMINATION:  VITAL SIGNS:  Blood pressure reveals a blood pressure  of 144/80, heart rate 50, respiratory rate 18, temperature afebrile.  GENERAL:  This patient is a moderately obese white female who is alert  and  cooperative at the time of examination.  HEENT:  Head is atraumatic.  Eyes show pupils are equal, round, and reactive  to light.  Disk are flat bilaterally.  NECK:  Supple.  No carotid bruits noted.  LUNGS:  Clear.  CARDIOVASCULAR:  Regular rate and rhythm.  No obvious murmurs or rubs noted.  EXTREMITIES:  Without significant edema.  NEUROLOGICAL:  Cranial nerves as above.  Facial symmetry is present.  The  patient has good sensation to face with pinprick and soft touch bilaterally.  Has good strength to facial muscles, muscles to head turning, shoulder shrug  bilaterally.  The speech is well enunciated.  Not aphasic.  Extraocular  movements are full.  The patient has good strength on all four extremities.  Good symmetric motor tone is noted throughout.  Sensory testing reveals some  minimal decrease to pinprick sensation on the right hand and right leg  compared to the left.  Vibratory sensation is suppressed slightly on the  right hand when compared to the left and symmetric in the legs.  Deep tendon  reflexes are symmetric with normal toes neutral bilaterally.  The patient  has finger-nose-finger and toe-to-finger bilaterally.  Gait was not tested.   LABORATORY DATA:  Notable for a white count of 7.2, hemoglobin 11.2, MCV of  86.3, platelets of 342,000.  INR of 1.0.  Sodium of 138, potassium 4.2,  chloride of 105, CO2 29, glucose of 124, BUN of 26, creatinine of 1.1.  Total bilirubin of 0.5, alkaline phosphate 76, SGOT of 20, SGPT of 18, total  protein is 6.6.   IMPRESSION:  1. History of coronary artery disease.  2. History of transient ischemic attacks/stroke.   PLAN:  The patient has had a recent surgery for a left subclavian bypass  procedure for subclavian steel syndrome. The patient reported episodes of  dizziness and near syncope prior to this surgery and has done well since. The patient has had a cerebral angiogram done in April of 2004 showing  minimal stenosis in  the left internal carotid  artery.  Carotid doppler study done on this admission correlates  well with  one done in August of 2004.  Carotid vessel system appears to be relatively  stable.  I see no direct contraindication for CABG procedure at this point.  We will follow this patient during this hospitalization.                                               Marlan Palau, M.D.    CKW/MEDQ  D:  05/27/2003  T:  05/27/2003  Job:  [id number]   cc:   Arturo Morton. Riley Kill, M.D.   [facility]  [address]   [place]. Edilia Bo, M.D.  [address]  [place]  [place] [address]

## 2010-08-14 NOTE — Op Note (Signed)
NAME:  Diamond [name], Diamond [name] NO.:  [id number]   MEDICAL RECORD NO.:  [id number]                   PATIENT TYPE:  INP   LOCATION:  2302                                 FACILITY:  [facility]   PHYSICIAN:  Salvatore Decent. Dorris Fetch, M.D.         DATE OF BIRTH:  14-Jul-1940   DATE OF PROCEDURE:  06/03/2003  DATE OF DISCHARGE:                                 OPERATIVE REPORT   PREOPERATIVE DIAGNOSIS:  Ventricular fibrillation arrest, status post  coronary artery bypass grafting, rule out ischemia.   POSTOPERATIVE DIAGNOSIS:  Ventricular fibrillation arrest, status post  coronary artery bypass grafting, rule out ischemia.   PROCEDURE:  Re-exploration of mediastinum.   SURGEON:  Salvatore Decent. Dorris Fetch, M.D.   ASSISTANTEber Jones A. Eustaquio Boyden.   ANESTHESIA:  General.   FINDINGS:  Transesophageal echocardiography showed good wall motion in all  segments with preserved ventricular function.  All grafts patent.  Vein  grafts stripped easily.  Excellent Doppler flow noted in all grafts.  A 1.5  mm probe passed easily through both anastomoses of the graft to the right  coronary system.   CLINICAL NOTE:  Diamond [name] is a 71 year old female who earlier in the day  underwent coronary artery bypass grafting x4 for severe two-vessel coronary  disease with unstable angina.  She was taken to the surgical intensive care  unit postoperatively and initially appeared stable; however, she did have a  low cardiac index on the initial check postoperatively but appeared to be  responding appropriately to volume resuscitation.  Suddenly without warning,  she had a ventricular fibrillation arrest.  The nursing staff was present  and witnessed and began CPR immediately.  The patient was hooked to an  automated defibrillator and initially given shock per protocol.  She did not  respond.  Additional shock was administered per protocol.  CPR then was  reinstituted.  Epinephrine, lidocaine, and  amiodarone were administered.  The patient then responded to defibrillation initially with a bradycardic  rhythm.  She was paced and ultimately regained sinus rhythm.  An operating  room was available, and the patient was transported as expeditiously as  possible back to the operating room to inspect for the underlying cause of  the arrhythmia.  Of note, EKG obtained shortly after return of spontaneous  rhythm did show elevation in II, III, and aVF, consistent with inferior  ischemia and injury.   Diamond [name] then was transported urgently to the operating room.  After  ensuring adequate anesthesia, an additional 500 mg of vancomycin was  administered intravenously.  The chest, abdomen, and legs were prepped and  draped.  An incision was made through the patient's prior sternotomy  incision.  Suture material was removed.  The sternal wires were removed.  It  should be noted that the patient was hemodynamically stable throughout this  entire time frame with no additional arrhythmias.  The sternal wires were  removed.  A retractor was placed.  The pericardium was opened.  The vein  grafts were inspected.  There was no kinking.  Both grafts stripped easily.  No air was visible within the grafts.  Both were aspirated to ensure there  was no entrapped air.  A Doppler probe was used to inspect the flow signals  in the mammary artery as well as both vein grafts, and there were excellent  signals from each.  Because of the ST changes in the inferior leads on EKG,  the suspicion centered around the right coronary graft.  A small venotomy  was made in the vein graft just proximal to the anastomosis to the distal  right coronary.  A 1.5 mm probe passed easily into the distal right coronary  through the anastomosis and also passed easily through the distal  anastomosis to the posterior descending with no resistance met in either  case.  The venotomy was repaired with a 7-0 Prolene suture.  A TEE probe  had  been placed and had shown good left ventricular function with good wall  motion in all segments both prior to the incision as well as at this point  in the procedure.  The patient's hemodynamics remained good.  Seeing no  problems with the grafts, the chest was closed.  The pericardium was not  closed on this occasion.  The chest tubes were replaced in their previous  location.  The chest then was closed with simple and figure-of-eight heavy-  gauge stainless steel wires.  Transesophageal echo was performed immediately  before as well as after closure of the chest wall with no change in wall  motion.  The patient did have a transient drop in blood pressure, which  responded appropriately to volume resuscitation.  The patient was started on  a low-dose dopamine drip as well during this procedure.  The remainder of  the wound was closed then in the usual fashion.  Staples were used for the  skin closure.  Diamond [name] had no further arrhythmias.  She was taken from the  operating room to the surgical intensive care unit in critical condition.                                               Salvatore Decent Dorris Fetch, M.D.    SCH/MEDQ  D:  06/03/2003  T:  06/04/2003  Job:  [id number]   cc:   Arturo Morton. Riley Kill, M.D.   Aundra Dubin. Revankar, M.D.  [address]  [place]  [place] [address]  Fax: [phone]   Philemon Kingdom  [address]  [place]  [place] [address]  Fax: [phone]

## 2011-04-27 DIAGNOSIS — Z79899 Other long term (current) drug therapy: Secondary | ICD-10-CM | POA: Diagnosis not present

## 2011-04-27 DIAGNOSIS — I2510 Atherosclerotic heart disease of native coronary artery without angina pectoris: Secondary | ICD-10-CM | POA: Diagnosis not present

## 2011-04-27 DIAGNOSIS — K219 Gastro-esophageal reflux disease without esophagitis: Secondary | ICD-10-CM | POA: Diagnosis not present

## 2011-04-27 DIAGNOSIS — E785 Hyperlipidemia, unspecified: Secondary | ICD-10-CM | POA: Diagnosis not present

## 2011-04-27 DIAGNOSIS — I10 Essential (primary) hypertension: Secondary | ICD-10-CM | POA: Diagnosis not present

## 2011-08-17 DIAGNOSIS — E785 Hyperlipidemia, unspecified: Secondary | ICD-10-CM | POA: Diagnosis not present

## 2011-08-17 DIAGNOSIS — I2510 Atherosclerotic heart disease of native coronary artery without angina pectoris: Secondary | ICD-10-CM | POA: Diagnosis not present

## 2011-08-17 DIAGNOSIS — I10 Essential (primary) hypertension: Secondary | ICD-10-CM | POA: Diagnosis not present

## 2011-08-25 DIAGNOSIS — G43009 Migraine without aura, not intractable, without status migrainosus: Secondary | ICD-10-CM | POA: Diagnosis not present

## 2011-08-25 DIAGNOSIS — I10 Essential (primary) hypertension: Secondary | ICD-10-CM | POA: Diagnosis not present

## 2011-08-25 DIAGNOSIS — E785 Hyperlipidemia, unspecified: Secondary | ICD-10-CM | POA: Diagnosis not present

## 2011-08-25 DIAGNOSIS — Z79899 Other long term (current) drug therapy: Secondary | ICD-10-CM | POA: Diagnosis not present

## 2011-08-25 DIAGNOSIS — I2510 Atherosclerotic heart disease of native coronary artery without angina pectoris: Secondary | ICD-10-CM | POA: Diagnosis not present

## 2011-11-25 DIAGNOSIS — M899 Disorder of bone, unspecified: Secondary | ICD-10-CM | POA: Diagnosis not present

## 2011-11-25 DIAGNOSIS — E785 Hyperlipidemia, unspecified: Secondary | ICD-10-CM | POA: Diagnosis not present

## 2011-11-25 DIAGNOSIS — I10 Essential (primary) hypertension: Secondary | ICD-10-CM | POA: Diagnosis not present

## 2011-11-25 DIAGNOSIS — I2510 Atherosclerotic heart disease of native coronary artery without angina pectoris: Secondary | ICD-10-CM | POA: Diagnosis not present

## 2011-11-25 DIAGNOSIS — M2550 Pain in unspecified joint: Secondary | ICD-10-CM | POA: Diagnosis not present

## 2011-11-25 DIAGNOSIS — Z79899 Other long term (current) drug therapy: Secondary | ICD-10-CM | POA: Diagnosis not present

## 2011-11-25 DIAGNOSIS — M949 Disorder of cartilage, unspecified: Secondary | ICD-10-CM | POA: Diagnosis not present

## 2011-11-25 DIAGNOSIS — Z1231 Encounter for screening mammogram for malignant neoplasm of breast: Secondary | ICD-10-CM | POA: Diagnosis not present

## 2011-12-16 DIAGNOSIS — Z1231 Encounter for screening mammogram for malignant neoplasm of breast: Secondary | ICD-10-CM | POA: Diagnosis not present

## 2011-12-22 DIAGNOSIS — M899 Disorder of bone, unspecified: Secondary | ICD-10-CM | POA: Diagnosis not present

## 2011-12-22 DIAGNOSIS — M949 Disorder of cartilage, unspecified: Secondary | ICD-10-CM | POA: Diagnosis not present

## 2012-03-08 ENCOUNTER — Encounter: Payer: Self-pay | Admitting: Vascular Surgery

## 2012-04-04 DIAGNOSIS — K219 Gastro-esophageal reflux disease without esophagitis: Secondary | ICD-10-CM | POA: Diagnosis not present

## 2012-04-04 DIAGNOSIS — N952 Postmenopausal atrophic vaginitis: Secondary | ICD-10-CM | POA: Diagnosis not present

## 2012-04-04 DIAGNOSIS — I2510 Atherosclerotic heart disease of native coronary artery without angina pectoris: Secondary | ICD-10-CM | POA: Diagnosis not present

## 2012-04-04 DIAGNOSIS — Z79899 Other long term (current) drug therapy: Secondary | ICD-10-CM | POA: Diagnosis not present

## 2012-04-04 DIAGNOSIS — E785 Hyperlipidemia, unspecified: Secondary | ICD-10-CM | POA: Diagnosis not present

## 2012-04-04 DIAGNOSIS — M2550 Pain in unspecified joint: Secondary | ICD-10-CM | POA: Diagnosis not present

## 2012-04-04 DIAGNOSIS — I10 Essential (primary) hypertension: Secondary | ICD-10-CM | POA: Diagnosis not present

## 2012-05-31 DIAGNOSIS — I2510 Atherosclerotic heart disease of native coronary artery without angina pectoris: Secondary | ICD-10-CM | POA: Diagnosis not present

## 2012-05-31 DIAGNOSIS — Z8673 Personal history of transient ischemic attack (TIA), and cerebral infarction without residual deficits: Secondary | ICD-10-CM | POA: Diagnosis not present

## 2012-05-31 DIAGNOSIS — I10 Essential (primary) hypertension: Secondary | ICD-10-CM | POA: Diagnosis not present

## 2012-05-31 DIAGNOSIS — Z8249 Family history of ischemic heart disease and other diseases of the circulatory system: Secondary | ICD-10-CM | POA: Diagnosis not present

## 2012-05-31 DIAGNOSIS — E785 Hyperlipidemia, unspecified: Secondary | ICD-10-CM | POA: Diagnosis not present

## 2012-08-02 DIAGNOSIS — G250 Essential tremor: Secondary | ICD-10-CM | POA: Diagnosis not present

## 2012-08-02 DIAGNOSIS — E785 Hyperlipidemia, unspecified: Secondary | ICD-10-CM | POA: Diagnosis not present

## 2012-08-02 DIAGNOSIS — Z79899 Other long term (current) drug therapy: Secondary | ICD-10-CM | POA: Diagnosis not present

## 2012-08-02 DIAGNOSIS — M2550 Pain in unspecified joint: Secondary | ICD-10-CM | POA: Diagnosis not present

## 2012-08-02 DIAGNOSIS — N390 Urinary tract infection, site not specified: Secondary | ICD-10-CM | POA: Diagnosis not present

## 2012-08-02 DIAGNOSIS — R609 Edema, unspecified: Secondary | ICD-10-CM | POA: Diagnosis not present

## 2012-08-02 DIAGNOSIS — I2510 Atherosclerotic heart disease of native coronary artery without angina pectoris: Secondary | ICD-10-CM | POA: Diagnosis not present

## 2012-08-02 DIAGNOSIS — I10 Essential (primary) hypertension: Secondary | ICD-10-CM | POA: Diagnosis not present

## 2012-08-30 DIAGNOSIS — Z79899 Other long term (current) drug therapy: Secondary | ICD-10-CM | POA: Diagnosis not present

## 2012-08-30 DIAGNOSIS — I10 Essential (primary) hypertension: Secondary | ICD-10-CM | POA: Diagnosis not present

## 2012-08-30 DIAGNOSIS — R609 Edema, unspecified: Secondary | ICD-10-CM | POA: Diagnosis not present

## 2012-09-14 DIAGNOSIS — E875 Hyperkalemia: Secondary | ICD-10-CM | POA: Diagnosis not present

## 2012-10-11 DIAGNOSIS — IMO0002 Reserved for concepts with insufficient information to code with codable children: Secondary | ICD-10-CM | POA: Diagnosis not present

## 2012-11-30 DIAGNOSIS — I2510 Atherosclerotic heart disease of native coronary artery without angina pectoris: Secondary | ICD-10-CM | POA: Diagnosis not present

## 2012-11-30 DIAGNOSIS — I10 Essential (primary) hypertension: Secondary | ICD-10-CM | POA: Diagnosis not present

## 2012-11-30 DIAGNOSIS — Z79899 Other long term (current) drug therapy: Secondary | ICD-10-CM | POA: Diagnosis not present

## 2012-11-30 DIAGNOSIS — M2550 Pain in unspecified joint: Secondary | ICD-10-CM | POA: Diagnosis not present

## 2012-11-30 DIAGNOSIS — K219 Gastro-esophageal reflux disease without esophagitis: Secondary | ICD-10-CM | POA: Diagnosis not present

## 2012-11-30 DIAGNOSIS — E785 Hyperlipidemia, unspecified: Secondary | ICD-10-CM | POA: Diagnosis not present

## 2013-03-01 DIAGNOSIS — G43909 Migraine, unspecified, not intractable, without status migrainosus: Secondary | ICD-10-CM | POA: Diagnosis not present

## 2013-03-01 DIAGNOSIS — J019 Acute sinusitis, unspecified: Secondary | ICD-10-CM | POA: Diagnosis not present

## 2013-03-01 DIAGNOSIS — I2510 Atherosclerotic heart disease of native coronary artery without angina pectoris: Secondary | ICD-10-CM | POA: Diagnosis not present

## 2013-03-01 DIAGNOSIS — Z79899 Other long term (current) drug therapy: Secondary | ICD-10-CM | POA: Diagnosis not present

## 2013-03-01 DIAGNOSIS — E785 Hyperlipidemia, unspecified: Secondary | ICD-10-CM | POA: Diagnosis not present

## 2013-03-01 DIAGNOSIS — I10 Essential (primary) hypertension: Secondary | ICD-10-CM | POA: Diagnosis not present

## 2013-03-01 DIAGNOSIS — M2550 Pain in unspecified joint: Secondary | ICD-10-CM | POA: Diagnosis not present

## 2013-03-01 DIAGNOSIS — Z1231 Encounter for screening mammogram for malignant neoplasm of breast: Secondary | ICD-10-CM | POA: Diagnosis not present

## 2013-03-13 DIAGNOSIS — Z8673 Personal history of transient ischemic attack (TIA), and cerebral infarction without residual deficits: Secondary | ICD-10-CM | POA: Diagnosis not present

## 2013-03-13 DIAGNOSIS — E785 Hyperlipidemia, unspecified: Secondary | ICD-10-CM | POA: Diagnosis not present

## 2013-03-13 DIAGNOSIS — I2510 Atherosclerotic heart disease of native coronary artery without angina pectoris: Secondary | ICD-10-CM | POA: Diagnosis not present

## 2013-03-13 DIAGNOSIS — I10 Essential (primary) hypertension: Secondary | ICD-10-CM | POA: Diagnosis not present

## 2013-03-14 DIAGNOSIS — G43909 Migraine, unspecified, not intractable, without status migrainosus: Secondary | ICD-10-CM | POA: Diagnosis not present

## 2013-05-18 DIAGNOSIS — Z1231 Encounter for screening mammogram for malignant neoplasm of breast: Secondary | ICD-10-CM | POA: Diagnosis not present

## 2013-05-31 DIAGNOSIS — I2510 Atherosclerotic heart disease of native coronary artery without angina pectoris: Secondary | ICD-10-CM | POA: Diagnosis not present

## 2013-05-31 DIAGNOSIS — E559 Vitamin D deficiency, unspecified: Secondary | ICD-10-CM | POA: Diagnosis not present

## 2013-05-31 DIAGNOSIS — M2550 Pain in unspecified joint: Secondary | ICD-10-CM | POA: Diagnosis not present

## 2013-05-31 DIAGNOSIS — Z79899 Other long term (current) drug therapy: Secondary | ICD-10-CM | POA: Diagnosis not present

## 2013-05-31 DIAGNOSIS — E785 Hyperlipidemia, unspecified: Secondary | ICD-10-CM | POA: Diagnosis not present

## 2013-05-31 DIAGNOSIS — J0100 Acute maxillary sinusitis, unspecified: Secondary | ICD-10-CM | POA: Diagnosis not present

## 2013-05-31 DIAGNOSIS — I10 Essential (primary) hypertension: Secondary | ICD-10-CM | POA: Diagnosis not present

## 2013-10-02 DIAGNOSIS — E785 Hyperlipidemia, unspecified: Secondary | ICD-10-CM | POA: Diagnosis not present

## 2013-10-02 DIAGNOSIS — I2510 Atherosclerotic heart disease of native coronary artery without angina pectoris: Secondary | ICD-10-CM | POA: Diagnosis not present

## 2013-10-02 DIAGNOSIS — Z79899 Other long term (current) drug therapy: Secondary | ICD-10-CM | POA: Diagnosis not present

## 2013-10-02 DIAGNOSIS — M2550 Pain in unspecified joint: Secondary | ICD-10-CM | POA: Diagnosis not present

## 2013-10-02 DIAGNOSIS — I10 Essential (primary) hypertension: Secondary | ICD-10-CM | POA: Diagnosis not present

## 2013-10-02 DIAGNOSIS — K219 Gastro-esophageal reflux disease without esophagitis: Secondary | ICD-10-CM | POA: Diagnosis not present

## 2013-12-10 DIAGNOSIS — I2510 Atherosclerotic heart disease of native coronary artery without angina pectoris: Secondary | ICD-10-CM | POA: Diagnosis not present

## 2013-12-10 DIAGNOSIS — I10 Essential (primary) hypertension: Secondary | ICD-10-CM | POA: Diagnosis not present

## 2013-12-10 DIAGNOSIS — Z8673 Personal history of transient ischemic attack (TIA), and cerebral infarction without residual deficits: Secondary | ICD-10-CM | POA: Diagnosis not present

## 2013-12-10 DIAGNOSIS — E785 Hyperlipidemia, unspecified: Secondary | ICD-10-CM | POA: Diagnosis not present

## 2013-12-27 DIAGNOSIS — H43813 Vitreous degeneration, bilateral: Secondary | ICD-10-CM | POA: Diagnosis not present

## 2013-12-27 DIAGNOSIS — H52223 Regular astigmatism, bilateral: Secondary | ICD-10-CM | POA: Diagnosis not present

## 2013-12-27 DIAGNOSIS — H5203 Hypermetropia, bilateral: Secondary | ICD-10-CM | POA: Diagnosis not present

## 2013-12-27 DIAGNOSIS — H524 Presbyopia: Secondary | ICD-10-CM | POA: Diagnosis not present

## 2013-12-27 DIAGNOSIS — H25813 Combined forms of age-related cataract, bilateral: Secondary | ICD-10-CM | POA: Diagnosis not present

## 2013-12-27 DIAGNOSIS — I10 Essential (primary) hypertension: Secondary | ICD-10-CM | POA: Diagnosis not present

## 2014-02-04 DIAGNOSIS — J019 Acute sinusitis, unspecified: Secondary | ICD-10-CM | POA: Diagnosis not present

## 2014-02-04 DIAGNOSIS — K219 Gastro-esophageal reflux disease without esophagitis: Secondary | ICD-10-CM | POA: Diagnosis not present

## 2014-02-04 DIAGNOSIS — I10 Essential (primary) hypertension: Secondary | ICD-10-CM | POA: Diagnosis not present

## 2014-02-04 DIAGNOSIS — E785 Hyperlipidemia, unspecified: Secondary | ICD-10-CM | POA: Diagnosis not present

## 2014-02-04 DIAGNOSIS — I2510 Atherosclerotic heart disease of native coronary artery without angina pectoris: Secondary | ICD-10-CM | POA: Diagnosis not present

## 2014-02-04 DIAGNOSIS — M2550 Pain in unspecified joint: Secondary | ICD-10-CM | POA: Diagnosis not present

## 2014-02-04 DIAGNOSIS — Z79899 Other long term (current) drug therapy: Secondary | ICD-10-CM | POA: Diagnosis not present

## 2014-02-04 DIAGNOSIS — M859 Disorder of bone density and structure, unspecified: Secondary | ICD-10-CM | POA: Diagnosis not present

## 2014-06-10 DIAGNOSIS — J329 Chronic sinusitis, unspecified: Secondary | ICD-10-CM | POA: Diagnosis not present

## 2014-06-10 DIAGNOSIS — I2510 Atherosclerotic heart disease of native coronary artery without angina pectoris: Secondary | ICD-10-CM | POA: Diagnosis not present

## 2014-06-10 DIAGNOSIS — J309 Allergic rhinitis, unspecified: Secondary | ICD-10-CM | POA: Diagnosis not present

## 2014-06-10 DIAGNOSIS — I10 Essential (primary) hypertension: Secondary | ICD-10-CM | POA: Diagnosis not present

## 2014-06-10 DIAGNOSIS — Z79899 Other long term (current) drug therapy: Secondary | ICD-10-CM | POA: Diagnosis not present

## 2014-06-10 DIAGNOSIS — E785 Hyperlipidemia, unspecified: Secondary | ICD-10-CM | POA: Diagnosis not present

## 2014-06-10 DIAGNOSIS — M25511 Pain in right shoulder: Secondary | ICD-10-CM | POA: Diagnosis not present

## 2014-06-17 DIAGNOSIS — M7541 Impingement syndrome of right shoulder: Secondary | ICD-10-CM | POA: Diagnosis not present

## 2014-09-10 DIAGNOSIS — I2510 Atherosclerotic heart disease of native coronary artery without angina pectoris: Secondary | ICD-10-CM | POA: Diagnosis not present

## 2014-09-10 DIAGNOSIS — I10 Essential (primary) hypertension: Secondary | ICD-10-CM | POA: Diagnosis not present

## 2014-09-10 DIAGNOSIS — M545 Low back pain: Secondary | ICD-10-CM | POA: Diagnosis not present

## 2014-09-10 DIAGNOSIS — Z79899 Other long term (current) drug therapy: Secondary | ICD-10-CM | POA: Diagnosis not present

## 2014-09-10 DIAGNOSIS — E785 Hyperlipidemia, unspecified: Secondary | ICD-10-CM | POA: Diagnosis not present

## 2014-10-30 DIAGNOSIS — I10 Essential (primary) hypertension: Secondary | ICD-10-CM | POA: Insufficient documentation

## 2014-10-30 DIAGNOSIS — Z8673 Personal history of transient ischemic attack (TIA), and cerebral infarction without residual deficits: Secondary | ICD-10-CM | POA: Insufficient documentation

## 2014-10-30 DIAGNOSIS — I2510 Atherosclerotic heart disease of native coronary artery without angina pectoris: Secondary | ICD-10-CM | POA: Diagnosis not present

## 2014-10-30 DIAGNOSIS — Z951 Presence of aortocoronary bypass graft: Secondary | ICD-10-CM | POA: Insufficient documentation

## 2014-10-30 DIAGNOSIS — E785 Hyperlipidemia, unspecified: Secondary | ICD-10-CM | POA: Diagnosis not present

## 2014-11-27 DIAGNOSIS — I2510 Atherosclerotic heart disease of native coronary artery without angina pectoris: Secondary | ICD-10-CM | POA: Diagnosis not present

## 2014-11-27 DIAGNOSIS — E559 Vitamin D deficiency, unspecified: Secondary | ICD-10-CM | POA: Diagnosis not present

## 2014-11-27 DIAGNOSIS — R600 Localized edema: Secondary | ICD-10-CM | POA: Diagnosis not present

## 2014-11-27 DIAGNOSIS — I10 Essential (primary) hypertension: Secondary | ICD-10-CM | POA: Diagnosis not present

## 2014-11-27 DIAGNOSIS — R5382 Chronic fatigue, unspecified: Secondary | ICD-10-CM | POA: Diagnosis not present

## 2014-11-27 DIAGNOSIS — M7070 Other bursitis of hip, unspecified hip: Secondary | ICD-10-CM | POA: Diagnosis not present

## 2014-11-27 DIAGNOSIS — E785 Hyperlipidemia, unspecified: Secondary | ICD-10-CM | POA: Diagnosis not present

## 2014-11-27 DIAGNOSIS — M810 Age-related osteoporosis without current pathological fracture: Secondary | ICD-10-CM | POA: Diagnosis not present

## 2014-11-27 DIAGNOSIS — F411 Generalized anxiety disorder: Secondary | ICD-10-CM | POA: Diagnosis not present

## 2014-11-27 DIAGNOSIS — K219 Gastro-esophageal reflux disease without esophagitis: Secondary | ICD-10-CM | POA: Diagnosis not present

## 2014-11-27 DIAGNOSIS — M159 Polyosteoarthritis, unspecified: Secondary | ICD-10-CM | POA: Diagnosis not present

## 2014-11-27 DIAGNOSIS — I639 Cerebral infarction, unspecified: Secondary | ICD-10-CM | POA: Diagnosis not present

## 2014-12-12 DIAGNOSIS — Z1389 Encounter for screening for other disorder: Secondary | ICD-10-CM | POA: Diagnosis not present

## 2014-12-12 DIAGNOSIS — R600 Localized edema: Secondary | ICD-10-CM | POA: Diagnosis not present

## 2014-12-12 DIAGNOSIS — F411 Generalized anxiety disorder: Secondary | ICD-10-CM | POA: Diagnosis not present

## 2014-12-12 DIAGNOSIS — I2510 Atherosclerotic heart disease of native coronary artery without angina pectoris: Secondary | ICD-10-CM | POA: Diagnosis not present

## 2014-12-12 DIAGNOSIS — K219 Gastro-esophageal reflux disease without esophagitis: Secondary | ICD-10-CM | POA: Diagnosis not present

## 2014-12-12 DIAGNOSIS — M25511 Pain in right shoulder: Secondary | ICD-10-CM | POA: Diagnosis not present

## 2014-12-12 DIAGNOSIS — Z9181 History of falling: Secondary | ICD-10-CM | POA: Diagnosis not present

## 2014-12-12 DIAGNOSIS — I639 Cerebral infarction, unspecified: Secondary | ICD-10-CM | POA: Diagnosis not present

## 2014-12-12 DIAGNOSIS — E785 Hyperlipidemia, unspecified: Secondary | ICD-10-CM | POA: Diagnosis not present

## 2014-12-12 DIAGNOSIS — M159 Polyosteoarthritis, unspecified: Secondary | ICD-10-CM | POA: Diagnosis not present

## 2014-12-12 DIAGNOSIS — I10 Essential (primary) hypertension: Secondary | ICD-10-CM | POA: Diagnosis not present

## 2014-12-12 DIAGNOSIS — M810 Age-related osteoporosis without current pathological fracture: Secondary | ICD-10-CM | POA: Diagnosis not present

## 2014-12-30 DIAGNOSIS — M8589 Other specified disorders of bone density and structure, multiple sites: Secondary | ICD-10-CM | POA: Diagnosis not present

## 2015-01-09 DIAGNOSIS — M810 Age-related osteoporosis without current pathological fracture: Secondary | ICD-10-CM | POA: Diagnosis not present

## 2015-01-09 DIAGNOSIS — R600 Localized edema: Secondary | ICD-10-CM | POA: Diagnosis not present

## 2015-01-09 DIAGNOSIS — I639 Cerebral infarction, unspecified: Secondary | ICD-10-CM | POA: Diagnosis not present

## 2015-01-09 DIAGNOSIS — I10 Essential (primary) hypertension: Secondary | ICD-10-CM | POA: Diagnosis not present

## 2015-01-09 DIAGNOSIS — Z23 Encounter for immunization: Secondary | ICD-10-CM | POA: Diagnosis not present

## 2015-01-09 DIAGNOSIS — M159 Polyosteoarthritis, unspecified: Secondary | ICD-10-CM | POA: Diagnosis not present

## 2015-01-09 DIAGNOSIS — M7070 Other bursitis of hip, unspecified hip: Secondary | ICD-10-CM | POA: Diagnosis not present

## 2015-01-09 DIAGNOSIS — E559 Vitamin D deficiency, unspecified: Secondary | ICD-10-CM | POA: Diagnosis not present

## 2015-01-09 DIAGNOSIS — E785 Hyperlipidemia, unspecified: Secondary | ICD-10-CM | POA: Diagnosis not present

## 2015-01-09 DIAGNOSIS — I2510 Atherosclerotic heart disease of native coronary artery without angina pectoris: Secondary | ICD-10-CM | POA: Diagnosis not present

## 2015-01-09 DIAGNOSIS — K219 Gastro-esophageal reflux disease without esophagitis: Secondary | ICD-10-CM | POA: Diagnosis not present

## 2015-02-11 DIAGNOSIS — E785 Hyperlipidemia, unspecified: Secondary | ICD-10-CM | POA: Diagnosis not present

## 2015-02-11 DIAGNOSIS — E559 Vitamin D deficiency, unspecified: Secondary | ICD-10-CM | POA: Diagnosis not present

## 2015-02-11 DIAGNOSIS — M810 Age-related osteoporosis without current pathological fracture: Secondary | ICD-10-CM | POA: Diagnosis not present

## 2015-02-11 DIAGNOSIS — I2510 Atherosclerotic heart disease of native coronary artery without angina pectoris: Secondary | ICD-10-CM | POA: Diagnosis not present

## 2015-02-11 DIAGNOSIS — I639 Cerebral infarction, unspecified: Secondary | ICD-10-CM | POA: Diagnosis not present

## 2015-02-11 DIAGNOSIS — I10 Essential (primary) hypertension: Secondary | ICD-10-CM | POA: Diagnosis not present

## 2015-02-11 DIAGNOSIS — Z1389 Encounter for screening for other disorder: Secondary | ICD-10-CM | POA: Diagnosis not present

## 2015-02-11 DIAGNOSIS — M159 Polyosteoarthritis, unspecified: Secondary | ICD-10-CM | POA: Diagnosis not present

## 2015-02-11 DIAGNOSIS — R600 Localized edema: Secondary | ICD-10-CM | POA: Diagnosis not present

## 2015-02-11 DIAGNOSIS — K219 Gastro-esophageal reflux disease without esophagitis: Secondary | ICD-10-CM | POA: Diagnosis not present

## 2015-02-11 DIAGNOSIS — F411 Generalized anxiety disorder: Secondary | ICD-10-CM | POA: Diagnosis not present

## 2015-02-11 DIAGNOSIS — M7070 Other bursitis of hip, unspecified hip: Secondary | ICD-10-CM | POA: Diagnosis not present

## 2015-03-11 DIAGNOSIS — I2510 Atherosclerotic heart disease of native coronary artery without angina pectoris: Secondary | ICD-10-CM | POA: Diagnosis not present

## 2015-03-11 DIAGNOSIS — I10 Essential (primary) hypertension: Secondary | ICD-10-CM | POA: Diagnosis not present

## 2015-03-11 DIAGNOSIS — R600 Localized edema: Secondary | ICD-10-CM | POA: Diagnosis not present

## 2015-03-11 DIAGNOSIS — M7070 Other bursitis of hip, unspecified hip: Secondary | ICD-10-CM | POA: Diagnosis not present

## 2015-03-11 DIAGNOSIS — E785 Hyperlipidemia, unspecified: Secondary | ICD-10-CM | POA: Diagnosis not present

## 2015-03-11 DIAGNOSIS — Z1389 Encounter for screening for other disorder: Secondary | ICD-10-CM | POA: Diagnosis not present

## 2015-03-11 DIAGNOSIS — K219 Gastro-esophageal reflux disease without esophagitis: Secondary | ICD-10-CM | POA: Diagnosis not present

## 2015-03-11 DIAGNOSIS — F411 Generalized anxiety disorder: Secondary | ICD-10-CM | POA: Diagnosis not present

## 2015-03-11 DIAGNOSIS — M159 Polyosteoarthritis, unspecified: Secondary | ICD-10-CM | POA: Diagnosis not present

## 2015-03-11 DIAGNOSIS — J0101 Acute recurrent maxillary sinusitis: Secondary | ICD-10-CM | POA: Diagnosis not present

## 2015-03-11 DIAGNOSIS — E559 Vitamin D deficiency, unspecified: Secondary | ICD-10-CM | POA: Diagnosis not present

## 2015-03-11 DIAGNOSIS — I639 Cerebral infarction, unspecified: Secondary | ICD-10-CM | POA: Diagnosis not present

## 2015-03-18 DIAGNOSIS — M7070 Other bursitis of hip, unspecified hip: Secondary | ICD-10-CM | POA: Diagnosis not present

## 2015-03-18 DIAGNOSIS — K219 Gastro-esophageal reflux disease without esophagitis: Secondary | ICD-10-CM | POA: Diagnosis not present

## 2015-03-18 DIAGNOSIS — I639 Cerebral infarction, unspecified: Secondary | ICD-10-CM | POA: Diagnosis not present

## 2015-03-18 DIAGNOSIS — I2510 Atherosclerotic heart disease of native coronary artery without angina pectoris: Secondary | ICD-10-CM | POA: Diagnosis not present

## 2015-03-18 DIAGNOSIS — M159 Polyosteoarthritis, unspecified: Secondary | ICD-10-CM | POA: Diagnosis not present

## 2015-03-18 DIAGNOSIS — E785 Hyperlipidemia, unspecified: Secondary | ICD-10-CM | POA: Diagnosis not present

## 2015-03-18 DIAGNOSIS — I10 Essential (primary) hypertension: Secondary | ICD-10-CM | POA: Diagnosis not present

## 2015-03-18 DIAGNOSIS — R600 Localized edema: Secondary | ICD-10-CM | POA: Diagnosis not present

## 2015-03-18 DIAGNOSIS — F411 Generalized anxiety disorder: Secondary | ICD-10-CM | POA: Diagnosis not present

## 2015-03-18 DIAGNOSIS — M25511 Pain in right shoulder: Secondary | ICD-10-CM | POA: Diagnosis not present

## 2015-03-18 DIAGNOSIS — Z1389 Encounter for screening for other disorder: Secondary | ICD-10-CM | POA: Diagnosis not present

## 2015-03-18 DIAGNOSIS — E559 Vitamin D deficiency, unspecified: Secondary | ICD-10-CM | POA: Diagnosis not present

## 2015-05-21 DIAGNOSIS — I10 Essential (primary) hypertension: Secondary | ICD-10-CM | POA: Diagnosis not present

## 2015-05-21 DIAGNOSIS — M25511 Pain in right shoulder: Secondary | ICD-10-CM | POA: Diagnosis not present

## 2015-05-21 DIAGNOSIS — M7070 Other bursitis of hip, unspecified hip: Secondary | ICD-10-CM | POA: Diagnosis not present

## 2015-05-21 DIAGNOSIS — I639 Cerebral infarction, unspecified: Secondary | ICD-10-CM | POA: Diagnosis not present

## 2015-05-21 DIAGNOSIS — I2510 Atherosclerotic heart disease of native coronary artery without angina pectoris: Secondary | ICD-10-CM | POA: Diagnosis not present

## 2015-05-21 DIAGNOSIS — E559 Vitamin D deficiency, unspecified: Secondary | ICD-10-CM | POA: Diagnosis not present

## 2015-05-21 DIAGNOSIS — R600 Localized edema: Secondary | ICD-10-CM | POA: Diagnosis not present

## 2015-05-21 DIAGNOSIS — M810 Age-related osteoporosis without current pathological fracture: Secondary | ICD-10-CM | POA: Diagnosis not present

## 2015-05-21 DIAGNOSIS — K219 Gastro-esophageal reflux disease without esophagitis: Secondary | ICD-10-CM | POA: Diagnosis not present

## 2015-05-21 DIAGNOSIS — E785 Hyperlipidemia, unspecified: Secondary | ICD-10-CM | POA: Diagnosis not present

## 2015-05-21 DIAGNOSIS — M79671 Pain in right foot: Secondary | ICD-10-CM | POA: Diagnosis not present

## 2015-05-21 DIAGNOSIS — M159 Polyosteoarthritis, unspecified: Secondary | ICD-10-CM | POA: Diagnosis not present

## 2015-05-28 DIAGNOSIS — M7070 Other bursitis of hip, unspecified hip: Secondary | ICD-10-CM | POA: Diagnosis not present

## 2015-05-28 DIAGNOSIS — I2510 Atherosclerotic heart disease of native coronary artery without angina pectoris: Secondary | ICD-10-CM | POA: Diagnosis not present

## 2015-05-28 DIAGNOSIS — E785 Hyperlipidemia, unspecified: Secondary | ICD-10-CM | POA: Diagnosis not present

## 2015-05-28 DIAGNOSIS — M79671 Pain in right foot: Secondary | ICD-10-CM | POA: Diagnosis not present

## 2015-05-28 DIAGNOSIS — F411 Generalized anxiety disorder: Secondary | ICD-10-CM | POA: Diagnosis not present

## 2015-05-28 DIAGNOSIS — I639 Cerebral infarction, unspecified: Secondary | ICD-10-CM | POA: Diagnosis not present

## 2015-05-28 DIAGNOSIS — M159 Polyosteoarthritis, unspecified: Secondary | ICD-10-CM | POA: Diagnosis not present

## 2015-05-28 DIAGNOSIS — K219 Gastro-esophageal reflux disease without esophagitis: Secondary | ICD-10-CM | POA: Diagnosis not present

## 2015-05-28 DIAGNOSIS — R600 Localized edema: Secondary | ICD-10-CM | POA: Diagnosis not present

## 2015-05-28 DIAGNOSIS — Z1389 Encounter for screening for other disorder: Secondary | ICD-10-CM | POA: Diagnosis not present

## 2015-05-28 DIAGNOSIS — M810 Age-related osteoporosis without current pathological fracture: Secondary | ICD-10-CM | POA: Diagnosis not present

## 2015-05-28 DIAGNOSIS — E559 Vitamin D deficiency, unspecified: Secondary | ICD-10-CM | POA: Diagnosis not present

## 2015-06-27 DIAGNOSIS — M810 Age-related osteoporosis without current pathological fracture: Secondary | ICD-10-CM | POA: Diagnosis not present

## 2015-06-27 DIAGNOSIS — K219 Gastro-esophageal reflux disease without esophagitis: Secondary | ICD-10-CM | POA: Diagnosis not present

## 2015-06-27 DIAGNOSIS — I639 Cerebral infarction, unspecified: Secondary | ICD-10-CM | POA: Diagnosis not present

## 2015-06-27 DIAGNOSIS — R600 Localized edema: Secondary | ICD-10-CM | POA: Diagnosis not present

## 2015-06-27 DIAGNOSIS — F411 Generalized anxiety disorder: Secondary | ICD-10-CM | POA: Diagnosis not present

## 2015-06-27 DIAGNOSIS — E559 Vitamin D deficiency, unspecified: Secondary | ICD-10-CM | POA: Diagnosis not present

## 2015-06-27 DIAGNOSIS — I2510 Atherosclerotic heart disease of native coronary artery without angina pectoris: Secondary | ICD-10-CM | POA: Diagnosis not present

## 2015-06-27 DIAGNOSIS — M7070 Other bursitis of hip, unspecified hip: Secondary | ICD-10-CM | POA: Diagnosis not present

## 2015-06-27 DIAGNOSIS — M79671 Pain in right foot: Secondary | ICD-10-CM | POA: Diagnosis not present

## 2015-06-27 DIAGNOSIS — M159 Polyosteoarthritis, unspecified: Secondary | ICD-10-CM | POA: Diagnosis not present

## 2015-06-27 DIAGNOSIS — E785 Hyperlipidemia, unspecified: Secondary | ICD-10-CM | POA: Diagnosis not present

## 2015-06-27 DIAGNOSIS — J0101 Acute recurrent maxillary sinusitis: Secondary | ICD-10-CM | POA: Diagnosis not present

## 2015-07-09 DIAGNOSIS — E785 Hyperlipidemia, unspecified: Secondary | ICD-10-CM | POA: Diagnosis not present

## 2015-07-09 DIAGNOSIS — I10 Essential (primary) hypertension: Secondary | ICD-10-CM | POA: Diagnosis not present

## 2015-07-09 DIAGNOSIS — Z951 Presence of aortocoronary bypass graft: Secondary | ICD-10-CM | POA: Diagnosis not present

## 2015-07-09 DIAGNOSIS — I2510 Atherosclerotic heart disease of native coronary artery without angina pectoris: Secondary | ICD-10-CM | POA: Diagnosis not present

## 2015-07-09 DIAGNOSIS — Z8673 Personal history of transient ischemic attack (TIA), and cerebral infarction without residual deficits: Secondary | ICD-10-CM | POA: Diagnosis not present

## 2015-08-26 DIAGNOSIS — F411 Generalized anxiety disorder: Secondary | ICD-10-CM | POA: Diagnosis not present

## 2015-08-26 DIAGNOSIS — E785 Hyperlipidemia, unspecified: Secondary | ICD-10-CM | POA: Diagnosis not present

## 2015-08-26 DIAGNOSIS — I2510 Atherosclerotic heart disease of native coronary artery without angina pectoris: Secondary | ICD-10-CM | POA: Diagnosis not present

## 2015-08-26 DIAGNOSIS — M25511 Pain in right shoulder: Secondary | ICD-10-CM | POA: Diagnosis not present

## 2015-08-26 DIAGNOSIS — E559 Vitamin D deficiency, unspecified: Secondary | ICD-10-CM | POA: Diagnosis not present

## 2015-08-26 DIAGNOSIS — M79671 Pain in right foot: Secondary | ICD-10-CM | POA: Diagnosis not present

## 2015-08-26 DIAGNOSIS — R600 Localized edema: Secondary | ICD-10-CM | POA: Diagnosis not present

## 2015-08-26 DIAGNOSIS — M810 Age-related osteoporosis without current pathological fracture: Secondary | ICD-10-CM | POA: Diagnosis not present

## 2015-08-26 DIAGNOSIS — M7070 Other bursitis of hip, unspecified hip: Secondary | ICD-10-CM | POA: Diagnosis not present

## 2015-08-26 DIAGNOSIS — M159 Polyosteoarthritis, unspecified: Secondary | ICD-10-CM | POA: Diagnosis not present

## 2015-08-26 DIAGNOSIS — I639 Cerebral infarction, unspecified: Secondary | ICD-10-CM | POA: Diagnosis not present

## 2015-09-23 DIAGNOSIS — K219 Gastro-esophageal reflux disease without esophagitis: Secondary | ICD-10-CM | POA: Diagnosis not present

## 2015-09-23 DIAGNOSIS — I2510 Atherosclerotic heart disease of native coronary artery without angina pectoris: Secondary | ICD-10-CM | POA: Diagnosis not present

## 2015-09-23 DIAGNOSIS — E559 Vitamin D deficiency, unspecified: Secondary | ICD-10-CM | POA: Diagnosis not present

## 2015-09-23 DIAGNOSIS — M159 Polyosteoarthritis, unspecified: Secondary | ICD-10-CM | POA: Diagnosis not present

## 2015-09-23 DIAGNOSIS — M810 Age-related osteoporosis without current pathological fracture: Secondary | ICD-10-CM | POA: Diagnosis not present

## 2015-09-23 DIAGNOSIS — M79671 Pain in right foot: Secondary | ICD-10-CM | POA: Diagnosis not present

## 2015-09-23 DIAGNOSIS — E785 Hyperlipidemia, unspecified: Secondary | ICD-10-CM | POA: Diagnosis not present

## 2015-09-23 DIAGNOSIS — M7070 Other bursitis of hip, unspecified hip: Secondary | ICD-10-CM | POA: Diagnosis not present

## 2015-09-23 DIAGNOSIS — R600 Localized edema: Secondary | ICD-10-CM | POA: Diagnosis not present

## 2015-09-23 DIAGNOSIS — F411 Generalized anxiety disorder: Secondary | ICD-10-CM | POA: Diagnosis not present

## 2015-09-23 DIAGNOSIS — I10 Essential (primary) hypertension: Secondary | ICD-10-CM | POA: Diagnosis not present

## 2015-09-23 DIAGNOSIS — I639 Cerebral infarction, unspecified: Secondary | ICD-10-CM | POA: Diagnosis not present

## 2015-10-01 DIAGNOSIS — I639 Cerebral infarction, unspecified: Secondary | ICD-10-CM | POA: Diagnosis not present

## 2015-10-01 DIAGNOSIS — K219 Gastro-esophageal reflux disease without esophagitis: Secondary | ICD-10-CM | POA: Diagnosis not present

## 2015-10-01 DIAGNOSIS — M25511 Pain in right shoulder: Secondary | ICD-10-CM | POA: Diagnosis not present

## 2015-10-01 DIAGNOSIS — I10 Essential (primary) hypertension: Secondary | ICD-10-CM | POA: Diagnosis not present

## 2015-10-01 DIAGNOSIS — R600 Localized edema: Secondary | ICD-10-CM | POA: Diagnosis not present

## 2015-10-01 DIAGNOSIS — M159 Polyosteoarthritis, unspecified: Secondary | ICD-10-CM | POA: Diagnosis not present

## 2015-10-01 DIAGNOSIS — M79671 Pain in right foot: Secondary | ICD-10-CM | POA: Diagnosis not present

## 2015-10-01 DIAGNOSIS — F411 Generalized anxiety disorder: Secondary | ICD-10-CM | POA: Diagnosis not present

## 2015-10-01 DIAGNOSIS — E559 Vitamin D deficiency, unspecified: Secondary | ICD-10-CM | POA: Diagnosis not present

## 2015-10-01 DIAGNOSIS — I2510 Atherosclerotic heart disease of native coronary artery without angina pectoris: Secondary | ICD-10-CM | POA: Diagnosis not present

## 2015-10-01 DIAGNOSIS — M810 Age-related osteoporosis without current pathological fracture: Secondary | ICD-10-CM | POA: Diagnosis not present

## 2015-10-01 DIAGNOSIS — E785 Hyperlipidemia, unspecified: Secondary | ICD-10-CM | POA: Diagnosis not present

## 2015-12-25 DIAGNOSIS — E669 Obesity, unspecified: Secondary | ICD-10-CM | POA: Diagnosis not present

## 2015-12-25 DIAGNOSIS — I10 Essential (primary) hypertension: Secondary | ICD-10-CM | POA: Diagnosis not present

## 2015-12-25 DIAGNOSIS — M79671 Pain in right foot: Secondary | ICD-10-CM | POA: Diagnosis not present

## 2015-12-25 DIAGNOSIS — R600 Localized edema: Secondary | ICD-10-CM | POA: Diagnosis not present

## 2015-12-25 DIAGNOSIS — Z9181 History of falling: Secondary | ICD-10-CM | POA: Diagnosis not present

## 2015-12-25 DIAGNOSIS — I639 Cerebral infarction, unspecified: Secondary | ICD-10-CM | POA: Diagnosis not present

## 2015-12-25 DIAGNOSIS — M810 Age-related osteoporosis without current pathological fracture: Secondary | ICD-10-CM | POA: Diagnosis not present

## 2015-12-25 DIAGNOSIS — E785 Hyperlipidemia, unspecified: Secondary | ICD-10-CM | POA: Diagnosis not present

## 2015-12-25 DIAGNOSIS — M159 Polyosteoarthritis, unspecified: Secondary | ICD-10-CM | POA: Diagnosis not present

## 2015-12-25 DIAGNOSIS — E559 Vitamin D deficiency, unspecified: Secondary | ICD-10-CM | POA: Diagnosis not present

## 2015-12-25 DIAGNOSIS — F411 Generalized anxiety disorder: Secondary | ICD-10-CM | POA: Diagnosis not present

## 2015-12-25 DIAGNOSIS — Z23 Encounter for immunization: Secondary | ICD-10-CM | POA: Diagnosis not present

## 2015-12-25 DIAGNOSIS — I2510 Atherosclerotic heart disease of native coronary artery without angina pectoris: Secondary | ICD-10-CM | POA: Diagnosis not present

## 2016-01-16 DIAGNOSIS — I10 Essential (primary) hypertension: Secondary | ICD-10-CM | POA: Diagnosis not present

## 2016-01-16 DIAGNOSIS — I639 Cerebral infarction, unspecified: Secondary | ICD-10-CM | POA: Diagnosis not present

## 2016-01-16 DIAGNOSIS — M79671 Pain in right foot: Secondary | ICD-10-CM | POA: Diagnosis not present

## 2016-01-16 DIAGNOSIS — M25511 Pain in right shoulder: Secondary | ICD-10-CM | POA: Diagnosis not present

## 2016-01-16 DIAGNOSIS — E785 Hyperlipidemia, unspecified: Secondary | ICD-10-CM | POA: Diagnosis not present

## 2016-01-16 DIAGNOSIS — I2510 Atherosclerotic heart disease of native coronary artery without angina pectoris: Secondary | ICD-10-CM | POA: Diagnosis not present

## 2016-01-16 DIAGNOSIS — F411 Generalized anxiety disorder: Secondary | ICD-10-CM | POA: Diagnosis not present

## 2016-01-16 DIAGNOSIS — K219 Gastro-esophageal reflux disease without esophagitis: Secondary | ICD-10-CM | POA: Diagnosis not present

## 2016-01-16 DIAGNOSIS — M810 Age-related osteoporosis without current pathological fracture: Secondary | ICD-10-CM | POA: Diagnosis not present

## 2016-01-16 DIAGNOSIS — E559 Vitamin D deficiency, unspecified: Secondary | ICD-10-CM | POA: Diagnosis not present

## 2016-01-16 DIAGNOSIS — R600 Localized edema: Secondary | ICD-10-CM | POA: Diagnosis not present

## 2016-01-16 DIAGNOSIS — M159 Polyosteoarthritis, unspecified: Secondary | ICD-10-CM | POA: Diagnosis not present

## 2016-01-20 DIAGNOSIS — H35342 Macular cyst, hole, or pseudohole, left eye: Secondary | ICD-10-CM | POA: Diagnosis not present

## 2016-01-20 DIAGNOSIS — H43393 Other vitreous opacities, bilateral: Secondary | ICD-10-CM | POA: Diagnosis not present

## 2016-01-20 DIAGNOSIS — H524 Presbyopia: Secondary | ICD-10-CM | POA: Diagnosis not present

## 2016-01-20 DIAGNOSIS — I10 Essential (primary) hypertension: Secondary | ICD-10-CM | POA: Diagnosis not present

## 2016-01-20 DIAGNOSIS — H5203 Hypermetropia, bilateral: Secondary | ICD-10-CM | POA: Diagnosis not present

## 2016-01-20 DIAGNOSIS — H25813 Combined forms of age-related cataract, bilateral: Secondary | ICD-10-CM | POA: Diagnosis not present

## 2016-01-20 DIAGNOSIS — H52223 Regular astigmatism, bilateral: Secondary | ICD-10-CM | POA: Diagnosis not present

## 2016-01-20 DIAGNOSIS — H43313 Vitreous membranes and strands, bilateral: Secondary | ICD-10-CM | POA: Diagnosis not present

## 2016-01-20 DIAGNOSIS — H43813 Vitreous degeneration, bilateral: Secondary | ICD-10-CM | POA: Diagnosis not present

## 2016-01-26 DIAGNOSIS — Z1231 Encounter for screening mammogram for malignant neoplasm of breast: Secondary | ICD-10-CM | POA: Diagnosis not present

## 2016-04-08 DIAGNOSIS — E785 Hyperlipidemia, unspecified: Secondary | ICD-10-CM | POA: Diagnosis not present

## 2016-04-08 DIAGNOSIS — I639 Cerebral infarction, unspecified: Secondary | ICD-10-CM | POA: Diagnosis not present

## 2016-04-08 DIAGNOSIS — I10 Essential (primary) hypertension: Secondary | ICD-10-CM | POA: Diagnosis not present

## 2016-04-08 DIAGNOSIS — K219 Gastro-esophageal reflux disease without esophagitis: Secondary | ICD-10-CM | POA: Diagnosis not present

## 2016-04-08 DIAGNOSIS — J0101 Acute recurrent maxillary sinusitis: Secondary | ICD-10-CM | POA: Diagnosis not present

## 2016-04-08 DIAGNOSIS — F411 Generalized anxiety disorder: Secondary | ICD-10-CM | POA: Diagnosis not present

## 2016-04-08 DIAGNOSIS — E559 Vitamin D deficiency, unspecified: Secondary | ICD-10-CM | POA: Diagnosis not present

## 2016-04-08 DIAGNOSIS — M810 Age-related osteoporosis without current pathological fracture: Secondary | ICD-10-CM | POA: Diagnosis not present

## 2016-04-08 DIAGNOSIS — R600 Localized edema: Secondary | ICD-10-CM | POA: Diagnosis not present

## 2016-04-08 DIAGNOSIS — I2510 Atherosclerotic heart disease of native coronary artery without angina pectoris: Secondary | ICD-10-CM | POA: Diagnosis not present

## 2016-04-08 DIAGNOSIS — M159 Polyosteoarthritis, unspecified: Secondary | ICD-10-CM | POA: Diagnosis not present

## 2016-04-08 DIAGNOSIS — M79671 Pain in right foot: Secondary | ICD-10-CM | POA: Diagnosis not present

## 2016-06-02 DIAGNOSIS — I10 Essential (primary) hypertension: Secondary | ICD-10-CM | POA: Diagnosis not present

## 2016-06-02 DIAGNOSIS — Z951 Presence of aortocoronary bypass graft: Secondary | ICD-10-CM | POA: Diagnosis not present

## 2016-06-02 DIAGNOSIS — Z8673 Personal history of transient ischemic attack (TIA), and cerebral infarction without residual deficits: Secondary | ICD-10-CM | POA: Diagnosis not present

## 2016-06-02 DIAGNOSIS — E785 Hyperlipidemia, unspecified: Secondary | ICD-10-CM | POA: Diagnosis not present

## 2016-06-02 DIAGNOSIS — I2510 Atherosclerotic heart disease of native coronary artery without angina pectoris: Secondary | ICD-10-CM | POA: Diagnosis not present

## 2016-07-07 DIAGNOSIS — F411 Generalized anxiety disorder: Secondary | ICD-10-CM | POA: Diagnosis not present

## 2016-07-07 DIAGNOSIS — E785 Hyperlipidemia, unspecified: Secondary | ICD-10-CM | POA: Diagnosis not present

## 2016-07-07 DIAGNOSIS — I10 Essential (primary) hypertension: Secondary | ICD-10-CM | POA: Diagnosis not present

## 2016-07-07 DIAGNOSIS — I639 Cerebral infarction, unspecified: Secondary | ICD-10-CM | POA: Diagnosis not present

## 2016-07-07 DIAGNOSIS — K219 Gastro-esophageal reflux disease without esophagitis: Secondary | ICD-10-CM | POA: Diagnosis not present

## 2016-07-07 DIAGNOSIS — M810 Age-related osteoporosis without current pathological fracture: Secondary | ICD-10-CM | POA: Diagnosis not present

## 2016-07-07 DIAGNOSIS — M79671 Pain in right foot: Secondary | ICD-10-CM | POA: Diagnosis not present

## 2016-07-07 DIAGNOSIS — E669 Obesity, unspecified: Secondary | ICD-10-CM | POA: Diagnosis not present

## 2016-07-07 DIAGNOSIS — M159 Polyosteoarthritis, unspecified: Secondary | ICD-10-CM | POA: Diagnosis not present

## 2016-07-07 DIAGNOSIS — I2510 Atherosclerotic heart disease of native coronary artery without angina pectoris: Secondary | ICD-10-CM | POA: Diagnosis not present

## 2016-07-07 DIAGNOSIS — R600 Localized edema: Secondary | ICD-10-CM | POA: Diagnosis not present

## 2016-07-07 DIAGNOSIS — E559 Vitamin D deficiency, unspecified: Secondary | ICD-10-CM | POA: Diagnosis not present

## 2016-07-14 DIAGNOSIS — Z136 Encounter for screening for cardiovascular disorders: Secondary | ICD-10-CM | POA: Diagnosis not present

## 2016-07-14 DIAGNOSIS — Z1389 Encounter for screening for other disorder: Secondary | ICD-10-CM | POA: Diagnosis not present

## 2016-07-14 DIAGNOSIS — Z1231 Encounter for screening mammogram for malignant neoplasm of breast: Secondary | ICD-10-CM | POA: Diagnosis not present

## 2016-07-14 DIAGNOSIS — E669 Obesity, unspecified: Secondary | ICD-10-CM | POA: Diagnosis not present

## 2016-07-14 DIAGNOSIS — N959 Unspecified menopausal and perimenopausal disorder: Secondary | ICD-10-CM | POA: Diagnosis not present

## 2016-07-14 DIAGNOSIS — Z9181 History of falling: Secondary | ICD-10-CM | POA: Diagnosis not present

## 2016-07-14 DIAGNOSIS — Z0000 Encounter for general adult medical examination without abnormal findings: Secondary | ICD-10-CM | POA: Diagnosis not present

## 2016-07-14 DIAGNOSIS — Z1211 Encounter for screening for malignant neoplasm of colon: Secondary | ICD-10-CM | POA: Diagnosis not present

## 2016-07-14 DIAGNOSIS — E785 Hyperlipidemia, unspecified: Secondary | ICD-10-CM | POA: Diagnosis not present

## 2016-07-20 DIAGNOSIS — R600 Localized edema: Secondary | ICD-10-CM | POA: Diagnosis not present

## 2016-07-20 DIAGNOSIS — M810 Age-related osteoporosis without current pathological fracture: Secondary | ICD-10-CM | POA: Diagnosis not present

## 2016-07-20 DIAGNOSIS — K219 Gastro-esophageal reflux disease without esophagitis: Secondary | ICD-10-CM | POA: Diagnosis not present

## 2016-07-20 DIAGNOSIS — F411 Generalized anxiety disorder: Secondary | ICD-10-CM | POA: Diagnosis not present

## 2016-07-20 DIAGNOSIS — I2510 Atherosclerotic heart disease of native coronary artery without angina pectoris: Secondary | ICD-10-CM | POA: Diagnosis not present

## 2016-07-20 DIAGNOSIS — E785 Hyperlipidemia, unspecified: Secondary | ICD-10-CM | POA: Diagnosis not present

## 2016-07-20 DIAGNOSIS — E559 Vitamin D deficiency, unspecified: Secondary | ICD-10-CM | POA: Diagnosis not present

## 2016-07-20 DIAGNOSIS — Z6832 Body mass index (BMI) 32.0-32.9, adult: Secondary | ICD-10-CM | POA: Diagnosis not present

## 2016-07-20 DIAGNOSIS — I639 Cerebral infarction, unspecified: Secondary | ICD-10-CM | POA: Diagnosis not present

## 2016-07-20 DIAGNOSIS — M159 Polyosteoarthritis, unspecified: Secondary | ICD-10-CM | POA: Diagnosis not present

## 2016-07-20 DIAGNOSIS — I10 Essential (primary) hypertension: Secondary | ICD-10-CM | POA: Diagnosis not present

## 2016-08-03 DIAGNOSIS — R600 Localized edema: Secondary | ICD-10-CM | POA: Diagnosis not present

## 2016-08-03 DIAGNOSIS — I2510 Atherosclerotic heart disease of native coronary artery without angina pectoris: Secondary | ICD-10-CM | POA: Diagnosis not present

## 2016-08-03 DIAGNOSIS — E785 Hyperlipidemia, unspecified: Secondary | ICD-10-CM | POA: Diagnosis not present

## 2016-08-03 DIAGNOSIS — I10 Essential (primary) hypertension: Secondary | ICD-10-CM | POA: Diagnosis not present

## 2016-08-03 DIAGNOSIS — K219 Gastro-esophageal reflux disease without esophagitis: Secondary | ICD-10-CM | POA: Diagnosis not present

## 2016-08-03 DIAGNOSIS — M25511 Pain in right shoulder: Secondary | ICD-10-CM | POA: Diagnosis not present

## 2016-08-03 DIAGNOSIS — M810 Age-related osteoporosis without current pathological fracture: Secondary | ICD-10-CM | POA: Diagnosis not present

## 2016-08-03 DIAGNOSIS — M79671 Pain in right foot: Secondary | ICD-10-CM | POA: Diagnosis not present

## 2016-08-03 DIAGNOSIS — F411 Generalized anxiety disorder: Secondary | ICD-10-CM | POA: Diagnosis not present

## 2016-08-03 DIAGNOSIS — M159 Polyosteoarthritis, unspecified: Secondary | ICD-10-CM | POA: Diagnosis not present

## 2016-08-03 DIAGNOSIS — I639 Cerebral infarction, unspecified: Secondary | ICD-10-CM | POA: Diagnosis not present

## 2016-08-03 DIAGNOSIS — E559 Vitamin D deficiency, unspecified: Secondary | ICD-10-CM | POA: Diagnosis not present

## 2016-08-10 DIAGNOSIS — K219 Gastro-esophageal reflux disease without esophagitis: Secondary | ICD-10-CM | POA: Diagnosis not present

## 2016-08-10 DIAGNOSIS — E669 Obesity, unspecified: Secondary | ICD-10-CM | POA: Diagnosis not present

## 2016-08-10 DIAGNOSIS — Z6831 Body mass index (BMI) 31.0-31.9, adult: Secondary | ICD-10-CM | POA: Diagnosis not present

## 2016-08-10 DIAGNOSIS — M25552 Pain in left hip: Secondary | ICD-10-CM | POA: Diagnosis not present

## 2016-08-10 DIAGNOSIS — F411 Generalized anxiety disorder: Secondary | ICD-10-CM | POA: Diagnosis not present

## 2016-08-10 DIAGNOSIS — E785 Hyperlipidemia, unspecified: Secondary | ICD-10-CM | POA: Diagnosis not present

## 2016-08-10 DIAGNOSIS — M159 Polyosteoarthritis, unspecified: Secondary | ICD-10-CM | POA: Diagnosis not present

## 2016-08-10 DIAGNOSIS — M810 Age-related osteoporosis without current pathological fracture: Secondary | ICD-10-CM | POA: Diagnosis not present

## 2016-08-10 DIAGNOSIS — I2510 Atherosclerotic heart disease of native coronary artery without angina pectoris: Secondary | ICD-10-CM | POA: Diagnosis not present

## 2016-08-10 DIAGNOSIS — I639 Cerebral infarction, unspecified: Secondary | ICD-10-CM | POA: Diagnosis not present

## 2016-08-19 DIAGNOSIS — E559 Vitamin D deficiency, unspecified: Secondary | ICD-10-CM | POA: Diagnosis not present

## 2016-08-19 DIAGNOSIS — M810 Age-related osteoporosis without current pathological fracture: Secondary | ICD-10-CM | POA: Diagnosis not present

## 2016-08-19 DIAGNOSIS — K219 Gastro-esophageal reflux disease without esophagitis: Secondary | ICD-10-CM | POA: Diagnosis not present

## 2016-08-19 DIAGNOSIS — I2510 Atherosclerotic heart disease of native coronary artery without angina pectoris: Secondary | ICD-10-CM | POA: Diagnosis not present

## 2016-08-19 DIAGNOSIS — F411 Generalized anxiety disorder: Secondary | ICD-10-CM | POA: Diagnosis not present

## 2016-08-19 DIAGNOSIS — E785 Hyperlipidemia, unspecified: Secondary | ICD-10-CM | POA: Diagnosis not present

## 2016-08-19 DIAGNOSIS — I10 Essential (primary) hypertension: Secondary | ICD-10-CM | POA: Diagnosis not present

## 2016-08-19 DIAGNOSIS — E669 Obesity, unspecified: Secondary | ICD-10-CM | POA: Diagnosis not present

## 2016-08-19 DIAGNOSIS — M159 Polyosteoarthritis, unspecified: Secondary | ICD-10-CM | POA: Diagnosis not present

## 2016-08-19 DIAGNOSIS — M79671 Pain in right foot: Secondary | ICD-10-CM | POA: Diagnosis not present

## 2016-08-19 DIAGNOSIS — I639 Cerebral infarction, unspecified: Secondary | ICD-10-CM | POA: Diagnosis not present

## 2016-08-19 DIAGNOSIS — R600 Localized edema: Secondary | ICD-10-CM | POA: Diagnosis not present

## 2016-10-12 DIAGNOSIS — M810 Age-related osteoporosis without current pathological fracture: Secondary | ICD-10-CM | POA: Diagnosis not present

## 2016-10-12 DIAGNOSIS — M79671 Pain in right foot: Secondary | ICD-10-CM | POA: Diagnosis not present

## 2016-10-12 DIAGNOSIS — I2510 Atherosclerotic heart disease of native coronary artery without angina pectoris: Secondary | ICD-10-CM | POA: Diagnosis not present

## 2016-10-12 DIAGNOSIS — E559 Vitamin D deficiency, unspecified: Secondary | ICD-10-CM | POA: Diagnosis not present

## 2016-10-12 DIAGNOSIS — K219 Gastro-esophageal reflux disease without esophagitis: Secondary | ICD-10-CM | POA: Diagnosis not present

## 2016-10-12 DIAGNOSIS — F411 Generalized anxiety disorder: Secondary | ICD-10-CM | POA: Diagnosis not present

## 2016-10-12 DIAGNOSIS — M159 Polyosteoarthritis, unspecified: Secondary | ICD-10-CM | POA: Diagnosis not present

## 2016-10-12 DIAGNOSIS — R600 Localized edema: Secondary | ICD-10-CM | POA: Diagnosis not present

## 2016-10-12 DIAGNOSIS — E785 Hyperlipidemia, unspecified: Secondary | ICD-10-CM | POA: Diagnosis not present

## 2016-10-12 DIAGNOSIS — I639 Cerebral infarction, unspecified: Secondary | ICD-10-CM | POA: Diagnosis not present

## 2016-10-12 DIAGNOSIS — I10 Essential (primary) hypertension: Secondary | ICD-10-CM | POA: Diagnosis not present

## 2016-10-12 DIAGNOSIS — E669 Obesity, unspecified: Secondary | ICD-10-CM | POA: Diagnosis not present

## 2016-11-10 DIAGNOSIS — E559 Vitamin D deficiency, unspecified: Secondary | ICD-10-CM | POA: Diagnosis not present

## 2016-11-10 DIAGNOSIS — F411 Generalized anxiety disorder: Secondary | ICD-10-CM | POA: Diagnosis not present

## 2016-11-10 DIAGNOSIS — M159 Polyosteoarthritis, unspecified: Secondary | ICD-10-CM | POA: Diagnosis not present

## 2016-11-10 DIAGNOSIS — M25511 Pain in right shoulder: Secondary | ICD-10-CM | POA: Diagnosis not present

## 2016-11-10 DIAGNOSIS — E785 Hyperlipidemia, unspecified: Secondary | ICD-10-CM | POA: Diagnosis not present

## 2016-11-10 DIAGNOSIS — M79671 Pain in right foot: Secondary | ICD-10-CM | POA: Diagnosis not present

## 2016-11-10 DIAGNOSIS — I10 Essential (primary) hypertension: Secondary | ICD-10-CM | POA: Diagnosis not present

## 2016-11-10 DIAGNOSIS — I639 Cerebral infarction, unspecified: Secondary | ICD-10-CM | POA: Diagnosis not present

## 2016-11-10 DIAGNOSIS — M810 Age-related osteoporosis without current pathological fracture: Secondary | ICD-10-CM | POA: Diagnosis not present

## 2016-11-10 DIAGNOSIS — I2510 Atherosclerotic heart disease of native coronary artery without angina pectoris: Secondary | ICD-10-CM | POA: Diagnosis not present

## 2016-11-10 DIAGNOSIS — R600 Localized edema: Secondary | ICD-10-CM | POA: Diagnosis not present

## 2016-11-10 DIAGNOSIS — K219 Gastro-esophageal reflux disease without esophagitis: Secondary | ICD-10-CM | POA: Diagnosis not present

## 2016-11-23 DIAGNOSIS — I2510 Atherosclerotic heart disease of native coronary artery without angina pectoris: Secondary | ICD-10-CM | POA: Diagnosis not present

## 2016-11-23 DIAGNOSIS — E785 Hyperlipidemia, unspecified: Secondary | ICD-10-CM | POA: Diagnosis not present

## 2016-11-23 DIAGNOSIS — M25511 Pain in right shoulder: Secondary | ICD-10-CM | POA: Diagnosis not present

## 2016-11-23 DIAGNOSIS — I10 Essential (primary) hypertension: Secondary | ICD-10-CM | POA: Diagnosis not present

## 2016-11-23 DIAGNOSIS — I639 Cerebral infarction, unspecified: Secondary | ICD-10-CM | POA: Diagnosis not present

## 2016-11-23 DIAGNOSIS — R600 Localized edema: Secondary | ICD-10-CM | POA: Diagnosis not present

## 2016-11-23 DIAGNOSIS — M79671 Pain in right foot: Secondary | ICD-10-CM | POA: Diagnosis not present

## 2016-11-23 DIAGNOSIS — E559 Vitamin D deficiency, unspecified: Secondary | ICD-10-CM | POA: Diagnosis not present

## 2016-11-23 DIAGNOSIS — M810 Age-related osteoporosis without current pathological fracture: Secondary | ICD-10-CM | POA: Diagnosis not present

## 2016-11-23 DIAGNOSIS — M159 Polyosteoarthritis, unspecified: Secondary | ICD-10-CM | POA: Diagnosis not present

## 2016-11-23 DIAGNOSIS — K219 Gastro-esophageal reflux disease without esophagitis: Secondary | ICD-10-CM | POA: Diagnosis not present

## 2016-11-23 DIAGNOSIS — F411 Generalized anxiety disorder: Secondary | ICD-10-CM | POA: Diagnosis not present

## 2016-12-31 DIAGNOSIS — N959 Unspecified menopausal and perimenopausal disorder: Secondary | ICD-10-CM | POA: Diagnosis not present

## 2016-12-31 DIAGNOSIS — M8589 Other specified disorders of bone density and structure, multiple sites: Secondary | ICD-10-CM | POA: Diagnosis not present

## 2017-01-03 DIAGNOSIS — E785 Hyperlipidemia, unspecified: Secondary | ICD-10-CM | POA: Insufficient documentation

## 2017-01-12 DIAGNOSIS — I639 Cerebral infarction, unspecified: Secondary | ICD-10-CM | POA: Diagnosis not present

## 2017-01-12 DIAGNOSIS — E785 Hyperlipidemia, unspecified: Secondary | ICD-10-CM | POA: Diagnosis not present

## 2017-01-12 DIAGNOSIS — M159 Polyosteoarthritis, unspecified: Secondary | ICD-10-CM | POA: Diagnosis not present

## 2017-01-12 DIAGNOSIS — F411 Generalized anxiety disorder: Secondary | ICD-10-CM | POA: Diagnosis not present

## 2017-01-12 DIAGNOSIS — R600 Localized edema: Secondary | ICD-10-CM | POA: Diagnosis not present

## 2017-01-12 DIAGNOSIS — Z23 Encounter for immunization: Secondary | ICD-10-CM | POA: Diagnosis not present

## 2017-01-12 DIAGNOSIS — I10 Essential (primary) hypertension: Secondary | ICD-10-CM | POA: Diagnosis not present

## 2017-01-12 DIAGNOSIS — K219 Gastro-esophageal reflux disease without esophagitis: Secondary | ICD-10-CM | POA: Diagnosis not present

## 2017-01-12 DIAGNOSIS — I2510 Atherosclerotic heart disease of native coronary artery without angina pectoris: Secondary | ICD-10-CM | POA: Diagnosis not present

## 2017-01-12 DIAGNOSIS — M810 Age-related osteoporosis without current pathological fracture: Secondary | ICD-10-CM | POA: Diagnosis not present

## 2017-01-27 DIAGNOSIS — Z23 Encounter for immunization: Secondary | ICD-10-CM | POA: Diagnosis not present

## 2017-04-15 DIAGNOSIS — M810 Age-related osteoporosis without current pathological fracture: Secondary | ICD-10-CM | POA: Diagnosis not present

## 2017-04-15 DIAGNOSIS — M25511 Pain in right shoulder: Secondary | ICD-10-CM | POA: Diagnosis not present

## 2017-04-15 DIAGNOSIS — M159 Polyosteoarthritis, unspecified: Secondary | ICD-10-CM | POA: Diagnosis not present

## 2017-04-15 DIAGNOSIS — I2510 Atherosclerotic heart disease of native coronary artery without angina pectoris: Secondary | ICD-10-CM | POA: Diagnosis not present

## 2017-04-15 DIAGNOSIS — K219 Gastro-esophageal reflux disease without esophagitis: Secondary | ICD-10-CM | POA: Diagnosis not present

## 2017-04-15 DIAGNOSIS — M79671 Pain in right foot: Secondary | ICD-10-CM | POA: Diagnosis not present

## 2017-04-15 DIAGNOSIS — R600 Localized edema: Secondary | ICD-10-CM | POA: Diagnosis not present

## 2017-04-15 DIAGNOSIS — I10 Essential (primary) hypertension: Secondary | ICD-10-CM | POA: Diagnosis not present

## 2017-04-15 DIAGNOSIS — I639 Cerebral infarction, unspecified: Secondary | ICD-10-CM | POA: Diagnosis not present

## 2017-04-15 DIAGNOSIS — F411 Generalized anxiety disorder: Secondary | ICD-10-CM | POA: Diagnosis not present

## 2017-04-15 DIAGNOSIS — E669 Obesity, unspecified: Secondary | ICD-10-CM | POA: Diagnosis not present

## 2017-04-15 DIAGNOSIS — E785 Hyperlipidemia, unspecified: Secondary | ICD-10-CM | POA: Diagnosis not present

## 2017-07-20 DIAGNOSIS — Z1331 Encounter for screening for depression: Secondary | ICD-10-CM | POA: Diagnosis not present

## 2017-07-20 DIAGNOSIS — M810 Age-related osteoporosis without current pathological fracture: Secondary | ICD-10-CM | POA: Diagnosis not present

## 2017-07-20 DIAGNOSIS — I10 Essential (primary) hypertension: Secondary | ICD-10-CM | POA: Diagnosis not present

## 2017-07-20 DIAGNOSIS — E785 Hyperlipidemia, unspecified: Secondary | ICD-10-CM | POA: Diagnosis not present

## 2017-07-20 DIAGNOSIS — Z9181 History of falling: Secondary | ICD-10-CM | POA: Diagnosis not present

## 2017-07-20 DIAGNOSIS — M25511 Pain in right shoulder: Secondary | ICD-10-CM | POA: Diagnosis not present

## 2017-07-20 DIAGNOSIS — I639 Cerebral infarction, unspecified: Secondary | ICD-10-CM | POA: Diagnosis not present

## 2017-07-20 DIAGNOSIS — I2510 Atherosclerotic heart disease of native coronary artery without angina pectoris: Secondary | ICD-10-CM | POA: Diagnosis not present

## 2017-08-03 DIAGNOSIS — Z6831 Body mass index (BMI) 31.0-31.9, adult: Secondary | ICD-10-CM | POA: Diagnosis not present

## 2017-08-03 DIAGNOSIS — Z1331 Encounter for screening for depression: Secondary | ICD-10-CM | POA: Diagnosis not present

## 2017-08-03 DIAGNOSIS — Z9181 History of falling: Secondary | ICD-10-CM | POA: Diagnosis not present

## 2017-08-03 DIAGNOSIS — Z139 Encounter for screening, unspecified: Secondary | ICD-10-CM | POA: Diagnosis not present

## 2017-08-03 DIAGNOSIS — Z0000 Encounter for general adult medical examination without abnormal findings: Secondary | ICD-10-CM | POA: Diagnosis not present

## 2017-08-03 DIAGNOSIS — E669 Obesity, unspecified: Secondary | ICD-10-CM | POA: Diagnosis not present

## 2017-08-03 DIAGNOSIS — E785 Hyperlipidemia, unspecified: Secondary | ICD-10-CM | POA: Diagnosis not present

## 2017-08-23 DIAGNOSIS — Z1231 Encounter for screening mammogram for malignant neoplasm of breast: Secondary | ICD-10-CM | POA: Diagnosis not present

## 2017-09-16 ENCOUNTER — Other Ambulatory Visit: Payer: Self-pay

## 2017-10-10 ENCOUNTER — Ambulatory Visit: Payer: Self-pay | Admitting: Cardiology

## 2017-10-13 ENCOUNTER — Encounter (HOSPITAL_COMMUNITY): Payer: Self-pay | Admitting: General Practice

## 2017-10-13 ENCOUNTER — Inpatient Hospital Stay (HOSPITAL_COMMUNITY): Payer: Medicare Other

## 2017-10-13 ENCOUNTER — Inpatient Hospital Stay (HOSPITAL_COMMUNITY)
Admission: AD | Admit: 2017-10-13 | Discharge: 2017-10-18 | DRG: 065 | Disposition: A | Discharge status: Skilled Nursing Facility | Payer: Medicare Other | Source: Other Acute Inpatient Hospital | Attending: Internal Medicine | Admitting: Internal Medicine

## 2017-10-13 DIAGNOSIS — I10 Essential (primary) hypertension: Secondary | ICD-10-CM | POA: Diagnosis present

## 2017-10-13 DIAGNOSIS — R110 Nausea: Secondary | ICD-10-CM | POA: Diagnosis not present

## 2017-10-13 DIAGNOSIS — R52 Pain, unspecified: Secondary | ICD-10-CM | POA: Diagnosis not present

## 2017-10-13 DIAGNOSIS — Z951 Presence of aortocoronary bypass graft: Secondary | ICD-10-CM

## 2017-10-13 DIAGNOSIS — E876 Hypokalemia: Secondary | ICD-10-CM | POA: Diagnosis not present

## 2017-10-13 DIAGNOSIS — R260 Ataxic gait: Secondary | ICD-10-CM | POA: Diagnosis present

## 2017-10-13 DIAGNOSIS — Z9049 Acquired absence of other specified parts of digestive tract: Secondary | ICD-10-CM

## 2017-10-13 DIAGNOSIS — I6502 Occlusion and stenosis of left vertebral artery: Secondary | ICD-10-CM | POA: Diagnosis not present

## 2017-10-13 DIAGNOSIS — G458 Other transient cerebral ischemic attacks and related syndromes: Secondary | ICD-10-CM | POA: Diagnosis present

## 2017-10-13 DIAGNOSIS — Z91041 Radiographic dye allergy status: Secondary | ICD-10-CM

## 2017-10-13 DIAGNOSIS — E669 Obesity, unspecified: Secondary | ICD-10-CM | POA: Diagnosis present

## 2017-10-13 DIAGNOSIS — R1111 Vomiting without nausea: Secondary | ICD-10-CM | POA: Diagnosis not present

## 2017-10-13 DIAGNOSIS — I63542 Cerebral infarction due to unspecified occlusion or stenosis of left cerebellar artery: Secondary | ICD-10-CM | POA: Diagnosis not present

## 2017-10-13 DIAGNOSIS — Z743 Need for continuous supervision: Secondary | ICD-10-CM | POA: Diagnosis not present

## 2017-10-13 DIAGNOSIS — B9620 Unspecified Escherichia coli [E. coli] as the cause of diseases classified elsewhere: Secondary | ICD-10-CM | POA: Diagnosis present

## 2017-10-13 DIAGNOSIS — E871 Hypo-osmolality and hyponatremia: Secondary | ICD-10-CM | POA: Diagnosis not present

## 2017-10-13 DIAGNOSIS — I63442 Cerebral infarction due to embolism of left cerebellar artery: Secondary | ICD-10-CM

## 2017-10-13 DIAGNOSIS — G459 Transient cerebral ischemic attack, unspecified: Secondary | ICD-10-CM | POA: Diagnosis not present

## 2017-10-13 DIAGNOSIS — I771 Stricture of artery: Secondary | ICD-10-CM | POA: Diagnosis present

## 2017-10-13 DIAGNOSIS — Z6830 Body mass index (BMI) 30.0-30.9, adult: Secondary | ICD-10-CM | POA: Diagnosis not present

## 2017-10-13 DIAGNOSIS — Z7902 Long term (current) use of antithrombotics/antiplatelets: Secondary | ICD-10-CM

## 2017-10-13 DIAGNOSIS — G40909 Epilepsy, unspecified, not intractable, without status epilepticus: Secondary | ICD-10-CM | POA: Diagnosis present

## 2017-10-13 DIAGNOSIS — M5031 Other cervical disc degeneration,  high cervical region: Secondary | ICD-10-CM | POA: Diagnosis not present

## 2017-10-13 DIAGNOSIS — Z9181 History of falling: Secondary | ICD-10-CM

## 2017-10-13 DIAGNOSIS — I63212 Cerebral infarction due to unspecified occlusion or stenosis of left vertebral arteries: Secondary | ICD-10-CM

## 2017-10-13 DIAGNOSIS — I6789 Other cerebrovascular disease: Secondary | ICD-10-CM | POA: Diagnosis not present

## 2017-10-13 DIAGNOSIS — I69351 Hemiplegia and hemiparesis following cerebral infarction affecting right dominant side: Secondary | ICD-10-CM | POA: Diagnosis not present

## 2017-10-13 DIAGNOSIS — Z87891 Personal history of nicotine dependence: Secondary | ICD-10-CM

## 2017-10-13 DIAGNOSIS — M533 Sacrococcygeal disorders, not elsewhere classified: Secondary | ICD-10-CM | POA: Diagnosis present

## 2017-10-13 DIAGNOSIS — R339 Retention of urine, unspecified: Secondary | ICD-10-CM | POA: Diagnosis not present

## 2017-10-13 DIAGNOSIS — M21371 Foot drop, right foot: Secondary | ICD-10-CM | POA: Diagnosis present

## 2017-10-13 DIAGNOSIS — I739 Peripheral vascular disease, unspecified: Secondary | ICD-10-CM | POA: Diagnosis not present

## 2017-10-13 DIAGNOSIS — W19XXXA Unspecified fall, initial encounter: Secondary | ICD-10-CM | POA: Diagnosis not present

## 2017-10-13 DIAGNOSIS — R0602 Shortness of breath: Secondary | ICD-10-CM | POA: Diagnosis not present

## 2017-10-13 DIAGNOSIS — I480 Paroxysmal atrial fibrillation: Secondary | ICD-10-CM | POA: Diagnosis present

## 2017-10-13 DIAGNOSIS — R51 Headache: Secondary | ICD-10-CM | POA: Diagnosis not present

## 2017-10-13 DIAGNOSIS — E785 Hyperlipidemia, unspecified: Secondary | ICD-10-CM | POA: Diagnosis present

## 2017-10-13 DIAGNOSIS — I639 Cerebral infarction, unspecified: Secondary | ICD-10-CM

## 2017-10-13 DIAGNOSIS — Z8249 Family history of ischemic heart disease and other diseases of the circulatory system: Secondary | ICD-10-CM

## 2017-10-13 DIAGNOSIS — Z90710 Acquired absence of both cervix and uterus: Secondary | ICD-10-CM

## 2017-10-13 DIAGNOSIS — Z87442 Personal history of urinary calculi: Secondary | ICD-10-CM

## 2017-10-13 DIAGNOSIS — I4891 Unspecified atrial fibrillation: Secondary | ICD-10-CM

## 2017-10-13 DIAGNOSIS — R0689 Other abnormalities of breathing: Secondary | ICD-10-CM | POA: Diagnosis not present

## 2017-10-13 DIAGNOSIS — Z9861 Coronary angioplasty status: Secondary | ICD-10-CM

## 2017-10-13 DIAGNOSIS — N390 Urinary tract infection, site not specified: Secondary | ICD-10-CM | POA: Diagnosis present

## 2017-10-13 DIAGNOSIS — J90 Pleural effusion, not elsewhere classified: Secondary | ICD-10-CM | POA: Diagnosis not present

## 2017-10-13 DIAGNOSIS — G463 Brain stem stroke syndrome: Secondary | ICD-10-CM | POA: Diagnosis not present

## 2017-10-13 DIAGNOSIS — H5509 Other forms of nystagmus: Secondary | ICD-10-CM | POA: Diagnosis present

## 2017-10-13 DIAGNOSIS — I6389 Other cerebral infarction: Secondary | ICD-10-CM | POA: Diagnosis not present

## 2017-10-13 DIAGNOSIS — R29810 Facial weakness: Secondary | ICD-10-CM | POA: Diagnosis present

## 2017-10-13 DIAGNOSIS — I779 Disorder of arteries and arterioles, unspecified: Secondary | ICD-10-CM | POA: Diagnosis not present

## 2017-10-13 DIAGNOSIS — Z7982 Long term (current) use of aspirin: Secondary | ICD-10-CM

## 2017-10-13 DIAGNOSIS — Z7983 Long term (current) use of bisphosphonates: Secondary | ICD-10-CM

## 2017-10-13 DIAGNOSIS — Z8673 Personal history of transient ischemic attack (TIA), and cerebral infarction without residual deficits: Secondary | ICD-10-CM

## 2017-10-13 DIAGNOSIS — S3992XA Unspecified injury of lower back, initial encounter: Secondary | ICD-10-CM | POA: Diagnosis not present

## 2017-10-13 DIAGNOSIS — R29705 NIHSS score 5: Secondary | ICD-10-CM | POA: Diagnosis present

## 2017-10-13 DIAGNOSIS — R42 Dizziness and giddiness: Secondary | ICD-10-CM

## 2017-10-13 DIAGNOSIS — R0902 Hypoxemia: Secondary | ICD-10-CM

## 2017-10-13 DIAGNOSIS — R279 Unspecified lack of coordination: Secondary | ICD-10-CM | POA: Diagnosis not present

## 2017-10-13 DIAGNOSIS — I361 Nonrheumatic tricuspid (valve) insufficiency: Secondary | ICD-10-CM | POA: Diagnosis not present

## 2017-10-13 DIAGNOSIS — I2510 Atherosclerotic heart disease of native coronary artery without angina pectoris: Secondary | ICD-10-CM | POA: Diagnosis present

## 2017-10-13 DIAGNOSIS — I340 Nonrheumatic mitral (valve) insufficiency: Secondary | ICD-10-CM | POA: Diagnosis not present

## 2017-10-13 DIAGNOSIS — J984 Other disorders of lung: Secondary | ICD-10-CM | POA: Diagnosis not present

## 2017-10-13 DIAGNOSIS — Z79899 Other long term (current) drug therapy: Secondary | ICD-10-CM

## 2017-10-13 DIAGNOSIS — R112 Nausea with vomiting, unspecified: Secondary | ICD-10-CM | POA: Diagnosis not present

## 2017-10-13 DIAGNOSIS — W19XXXD Unspecified fall, subsequent encounter: Secondary | ICD-10-CM | POA: Diagnosis not present

## 2017-10-13 DIAGNOSIS — I63012 Cerebral infarction due to thrombosis of left vertebral artery: Secondary | ICD-10-CM | POA: Diagnosis not present

## 2017-10-13 DIAGNOSIS — R270 Ataxia, unspecified: Secondary | ICD-10-CM | POA: Diagnosis not present

## 2017-10-13 DIAGNOSIS — I6522 Occlusion and stenosis of left carotid artery: Secondary | ICD-10-CM | POA: Diagnosis not present

## 2017-10-13 DIAGNOSIS — I700 Atherosclerosis of aorta: Secondary | ICD-10-CM | POA: Diagnosis not present

## 2017-10-13 DIAGNOSIS — Z885 Allergy status to narcotic agent status: Secondary | ICD-10-CM

## 2017-10-13 DIAGNOSIS — G319 Degenerative disease of nervous system, unspecified: Secondary | ICD-10-CM | POA: Diagnosis not present

## 2017-10-13 HISTORY — DX: Transient cerebral ischemic attack, unspecified: G45.9

## 2017-10-13 HISTORY — DX: Personal history of urinary calculi: Z87.442

## 2017-10-13 HISTORY — DX: Other transient cerebral ischemic attacks and related syndromes: G45.8

## 2017-10-13 HISTORY — DX: Unspecified atrial fibrillation: I48.91

## 2017-10-13 HISTORY — DX: Dizziness and giddiness: R42

## 2017-10-13 HISTORY — DX: Cerebral infarction, unspecified: I63.9

## 2017-10-13 HISTORY — DX: Personal history of other medical treatment: Z92.89

## 2017-10-13 HISTORY — DX: Pneumonia, unspecified organism: J18.9

## 2017-10-13 HISTORY — DX: Unspecified osteoarthritis, unspecified site: M19.90

## 2017-10-13 LAB — COMPREHENSIVE METABOLIC PANEL
ALT: 18 U/L (ref 0–44)
ANION GAP: 11 (ref 5–15)
AST: 26 U/L (ref 15–41)
Albumin: 3.6 g/dL (ref 3.5–5.0)
Alkaline Phosphatase: 64 U/L (ref 38–126)
BUN: 9 mg/dL (ref 8–23)
CALCIUM: 8.8 mg/dL — AB (ref 8.9–10.3)
CO2: 27 mmol/L (ref 22–32)
Chloride: 97 mmol/L — ABNORMAL LOW (ref 98–111)
Creatinine, Ser: 0.63 mg/dL (ref 0.44–1.00)
Glucose, Bld: 95 mg/dL (ref 70–99)
Potassium: 3.4 mmol/L — ABNORMAL LOW (ref 3.5–5.1)
SODIUM: 135 mmol/L (ref 135–145)
TOTAL PROTEIN: 6.7 g/dL (ref 6.5–8.1)
Total Bilirubin: 0.9 mg/dL (ref 0.3–1.2)

## 2017-10-13 LAB — PROTIME-INR
INR: 1.02
Prothrombin Time: 13.3 seconds (ref 11.4–15.2)

## 2017-10-13 LAB — RAPID URINE DRUG SCREEN, HOSP PERFORMED
Amphetamines: NOT DETECTED
BENZODIAZEPINES: NOT DETECTED
COCAINE: NOT DETECTED
OPIATES: NOT DETECTED
Tetrahydrocannabinol: NOT DETECTED

## 2017-10-13 MED ORDER — ASPIRIN EC 81 MG PO TBEC
81.0000 mg | DELAYED_RELEASE_TABLET | Freq: Every day | ORAL | Status: DC
  Administered 2017-10-14 – 2017-10-18 (×5): 81 mg via ORAL
  Filled 2017-10-13 (×5): qty 1

## 2017-10-13 MED ORDER — HEPARIN (PORCINE) IN NACL 100-0.45 UNIT/ML-% IJ SOLN
850.0000 [IU]/h | INTRAMUSCULAR | Status: DC
  Administered 2017-10-13: 850 [IU]/h via INTRAVENOUS
  Filled 2017-10-13: qty 250

## 2017-10-13 MED ORDER — HYDRALAZINE HCL 25 MG PO TABS: 25.0000 mg | ORAL_TABLET | Freq: Three times a day (TID) | ORAL | Status: DC | PRN

## 2017-10-13 MED ORDER — ATORVASTATIN CALCIUM 80 MG PO TABS
80.0000 mg | ORAL_TABLET | Freq: Every day | ORAL | Status: DC
  Administered 2017-10-13 – 2017-10-17 (×5): 80 mg via ORAL
  Filled 2017-10-13 (×5): qty 1

## 2017-10-13 MED ORDER — MECLIZINE HCL 12.5 MG PO TABS
25.0000 mg | ORAL_TABLET | Freq: Two times a day (BID) | ORAL | Status: DC | PRN
  Administered 2017-10-13 – 2017-10-16 (×2): 25 mg via ORAL
  Filled 2017-10-13 (×2): qty 2

## 2017-10-13 MED ORDER — ENOXAPARIN SODIUM 40 MG/0.4ML ~~LOC~~ SOLN: 40.0000 mg | SUBCUTANEOUS | Status: DC

## 2017-10-13 MED ORDER — TRAMADOL HCL 50 MG PO TABS
50.0000 mg | ORAL_TABLET | Freq: Four times a day (QID) | ORAL | Status: DC | PRN
  Administered 2017-10-13 – 2017-10-18 (×8): 50 mg via ORAL
  Filled 2017-10-13 (×8): qty 1

## 2017-10-13 MED ORDER — ACETAMINOPHEN 160 MG/5ML PO SOLN: 650.0000 mg | ORAL | Status: DC | PRN

## 2017-10-13 MED ORDER — CLOPIDOGREL BISULFATE 75 MG PO TABS: 75.0000 mg | ORAL_TABLET | Freq: Every day | ORAL | Status: DC

## 2017-10-13 MED ORDER — ONDANSETRON HCL 4 MG/2ML IJ SOLN
4.0000 mg | Freq: Four times a day (QID) | INTRAMUSCULAR | Status: DC | PRN
  Administered 2017-10-13 – 2017-10-16 (×4): 4 mg via INTRAVENOUS
  Filled 2017-10-13 (×4): qty 2

## 2017-10-13 MED ORDER — ACETAMINOPHEN 325 MG PO TABS
650.0000 mg | ORAL_TABLET | ORAL | Status: DC | PRN
  Administered 2017-10-15: 650 mg via ORAL
  Filled 2017-10-13: qty 2

## 2017-10-13 MED ORDER — LACTATED RINGERS IV SOLN
INTRAVENOUS | Status: DC
  Administered 2017-10-13 – 2017-10-16 (×4): via INTRAVENOUS

## 2017-10-13 MED ORDER — ACETAMINOPHEN 650 MG RE SUPP: 650.0000 mg | RECTAL | Status: DC | PRN

## 2017-10-13 MED ORDER — SENNOSIDES-DOCUSATE SODIUM 8.6-50 MG PO TABS: 1.0000 | ORAL_TABLET | Freq: Every evening | ORAL | Status: DC | PRN

## 2017-10-13 MED ORDER — FENTANYL CITRATE (PF) 100 MCG/2ML IJ SOLN
12.5000 ug | Freq: Four times a day (QID) | INTRAMUSCULAR | Status: DC | PRN
  Administered 2017-10-14 – 2017-10-16 (×3): 12.5 ug via INTRAVENOUS
  Filled 2017-10-13 (×4): qty 2

## 2017-10-13 MED ORDER — STROKE: EARLY STAGES OF RECOVERY BOOK
Freq: Once | Status: AC
  Administered 2017-10-13: 18:00:00

## 2017-10-13 NOTE — Progress Notes (Signed)
ANTICOAGULATION CONSULT NOTE - Initial Consult  Pharmacy Consult for Heparin Indication: atrial fibrillation and vertebral stenosis, new stroke  Allergies  Allergen Reactions  . Codeine Other (See Comments)    Patient Measurements: Height: 5\' 1"  (154.9 cm) Weight: 160 lb (72.6 kg) IBW/kg (Calculated) : 47.8 Heparin Dosing Weight: 64 kg  Vital Signs: Temp: 98.1 F (36.7 C) (07/18 1712) BP: 153/69 (07/18 1505)  Labs:  [facility]:  INR 1.0, aPTT 22.2 seconds. Hgb 12.4, platelet count 312.  Creatinine 0.6, K+ 3.5, Na 132, LFTs normal  Recent Labs    10/13/17 1629  LABPROT 13.3  INR 1.02   Medical History: Past Medical History:  Diagnosis Date  . Arthritis    "arms, hands, neck, shoulders; some in my knees" (10/13/2017)  . CAD (coronary artery disease)   . CVA (cerebral vascular accident) (HCC) 1995, 2001   /notes 08/11/2010; "weakness in right arm as a result" (10/13/2017)  . History of blood transfusion    "related to OHS"  . History of kidney stones   . Hyperlipidemia   . Hypertension   . New onset a-fib (HCC) 10/13/2017  . Pneumonia    "when I was little"  . Subclavian steal syndrome 2004   LUE/notes 10/13/2017  . TIA (transient ischemic attack) 2003   [name]/notes 08/11/2010  . Vertigo 10/13/2017   ataxia/notes 10/13/2017   Assessment:   77 yr old [facility] and [facility] to [facility].  New onset atrial fibrillation and stroke; vertebral stenosis. On Aspirin and Plavix prior to admission.  [facility]  Goal of Therapy:  Heparin level 0.3-0.5 units/ml (low therapeutic goal for stroke) Monitor platelets by anticoagulation protocol: Yes   Plan:   Begin heparin drip at 850 units/hr.  No bolus due to stroke.  Heparin level ~8 hrs after drip begins.  Daily heparin level and CBC while on heparin.  Dennie Fettersgan, Lucielle Vokes [place], [place] Pager: [phone] 10/13/2017,6:04 PM

## 2017-10-13 NOTE — Consult Note (Addendum)
Neurology Consultation  Reason for Consult: Cerebellar stroke Referring Physician: Roger Shelter  CC: Dizziness and throwing up  History is obtained from: Patient  HPI: Diamond [name] is a 77 y.o. female past medical history of hyperlipidemia, hypertension and seizure.  Upon this consultation she currently is in atrial fibrillation.  Per patient she went to sleep at approximately 2200 hrs on Wednesday. Upon waking up in the middle of the night she suddenly felt severe vertigo and the feeling that she was going to throw up.  She tried to get out of her bed but did not make it to the bathroom and had emesis on the floor.  She made it back to her bed and continued to have vertigo and diaphoresis.  She continued to throw up throughout the night.  Patient called her family member who called EMS who then brought her to [facility].  MRI was obtained and showed a left inferior posterior cerebellar infarct in the PICA territory.  Patient was transferred to [facility] for further evaluation. She is significantly tired at this point in time.  Home meds include ASA and Plavix.   Per chart, patient does have a history of CVA 10 years ago with right-sided weakness. She also endorses this during the examination.   LKW: 2200 hrs. on 10/12/2017 tpa given?: no, out of window Premorbid modified Rankin scale (mRS): 0  ROS: A 14 point ROS was performed and is negative except as noted in the HPI.  Past Medical History:  Diagnosis Date  . CAD (coronary artery disease)   . Hyperlipidemia   . Hypertension    Stroke comorbidities Hypertension, hyperlipidemia and new onset A. fib  Family History  Problem Relation Age of Onset  . Heart disease Father      Social History:   has no tobacco, alcohol, and drug history on file.  Home Medications Current Meds  Medication Sig  . acetaminophen (TYLENOL) 500 MG tablet Take 1,000 mg by mouth as needed for mild pain or headache.  . alendronate (FOSAMAX) 70 MG  tablet Take 70 mg by mouth once a week.  . ALPRAZolam (XANAX) 0.5 MG tablet Take 0.5 mg by mouth every 6 (six) hours as needed for anxiety.  Marland Kitchen amLODipine (NORVASC) 5 MG tablet Take 5 mg by mouth daily.  Marland Kitchen aspirin EC 81 MG tablet Take 81 mg by mouth daily.  Marland Kitchen atorvastatin (LIPITOR) 80 MG tablet Take 80 mg by mouth daily.  . carvedilol (COREG) 6.25 MG tablet Take 6.25 mg by mouth 2 (two) times daily.  . clopidogrel (PLAVIX) 75 MG tablet Take 75 mg by mouth daily.  . hydroxypropyl methylcellulose / hypromellose (ISOPTO TEARS / GONIOVISC) 2.5 % ophthalmic solution Place 1 drop into both eyes as needed for dry eyes.  . ranitidine (ZANTAC) 150 MG tablet Take 150 mg by mouth 2 (two) times daily.  . [DISCONTINUED] ALPRAZolam (XANAX) 0.25 MG tablet Take 0.25 mg by mouth daily as needed for anxiety.  . [DISCONTINUED] meloxicam (MOBIC) 15 MG tablet Take 15 mg by mouth daily. With meals     Exam: Current vital signs: BP (!) 153/69 (BP Location: Left Arm)   Resp 20   SpO2 97%  Vital signs in last 24 hours: Resp:  [20] 20 (07/18 1505) BP: (153)/(69) 153/69 (07/18 1505) SpO2:  [97 %] 97 % (07/18 1505)  GENERAL: Awake, alert in NAD HEENT: - Normocephalic and atraumatic, dry mm,  LUNGS - Clear to auscultation bilaterally with no wheezes CV -irregularly irregular.  ABDOMEN - Soft, nontender, nondistended with normoactive BS Ext: warm, well perfused, intact peripheral pulses, no edema  NEURO:  Mental Status: AA&Ox3 with no dysarthria or aphasia able to follow commands Language: speech is clear.  Naming, repetition, fluency, and comprehension intact. Cranial Nerves: PERRL 2 mm - 1 mm brisk. Visual fields full. EOM with intermittent right lateral eye deviation on midgaze and occasional nystagmus; saccadic visual pursuits noted in both directions horizontally. Positive left facial droop, hearing intact, tongue/uvula/soft palate midline, normal sternocleidomastoid and trapezius muscle strength. No evidence  of tongue atrophy or fibrillations Motor: 4-/5 throughout in the context of fatigue. Repeat attending exam reveals 4/5 right upper and lower extremity strength, with 5/5 LUE and LLE strength Tone is slightly increased on the right.  Sensation- Intact to light touch and temp bilaterally Coordination: FTN intact bilaterally, no ataxia in BLE. Gait- deferred  NIHSS 5   Labs I have reviewed labs in epic and the results pertinent to this consultation are:   CBC No results found for: WBC, RBC, HGB, HCT, PLT, MCV, MCH, MCHC, RDW, LYMPHSABS, MONOABS, EOSABS, BASOSABS  CMP  No results found for: NA, K, CL, CO2, GLUCOSE, BUN, CREATININE, CALCIUM, PROT, ALBUMIN, AST, ALT, ALKPHOS, BILITOT, GFRNONAA, GFRAA  Lipid Panel  No results found for: CHOL, TRIG, HDL, CHOLHDL, VLDL, LDLCALC, LDLDIRECT   Imaging MRI examination of the brain (images reviewed in PACS): Shows an acute nonhemorrhagic left posterior inferior cerebellar infarct.  MRA shows an occluded left vertebral artery and PICA corresponding to the infarct.  She does have diffuse medium and small vessel disease without other significant proximal stenosis.  She has a remote infarct involving the left insular cortex, lentiform nucleus, and operculum  Assessment: 77 year old female with an acute nonhemorrhagic left PICA territory infarct.   1. She has new onset A. fib which we suspect is the culprit for her infarct (cardioembolic). 2. Other stroke risk factors: HTN, HLD and CAD.   Recommendations: -Stroke work-up to include carotid Dopplers, transthoracic echo, HgbA1c, fasting lipid panel  --NIH scale every 4 hours --PT/OT/Speech --BP management --Continue ASA, Plavix for now. Stroke team to determine in AM which anticoagulant to switch her to, and if so, to d/c Plavix. As she has CAD, will need to stay on ASA as well.  --Start atorvastatin 40 mg po qd. Obtain baseline CK level.   I have seen and examined the patient. I have amended the  assessment and plan above.  Electronically signed: Dr. Caryl PinaEric Mckinzie Saksa

## 2017-10-13 NOTE — H&P (Addendum)
History and Physical    DOA: 10/13/2017  PCP: No primary care provider on file.  Patient coming from: Home  Chief Complaint: Dizziness with nausea  HPI: Diamond [name] is a 77 y.o. 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 Edilia Bo in 2004.  presented to [facility] with complaints of dizziness.  Patient states about a month back she had episode of ataxia where she noted she was swaying to her right side which resolved within 30 minutes.  2 weeks back she had a episode of lightheadedness which she relates to heat stroke as she was out in the sun and felt better when she came into the house and had her ceiling fan on.  She states her symptoms for this episode started last night after she watched the weather report on television around 10 PM and went to bed.  She felt dizzy whenever she moved associated with nausea/vomiting.  She called her family who brought her into the hospital.  She states when the EMS arrived she was still vomiting and her blood pressure was elevated at systolic 190.  Sublingual Zofran relieved her symptoms but recurred when they moved her onto the stretcher.  Patient apparently also had a fall last night where she landed on her buttocks and complains of sacral pain 6/10 and requesting pain medications.  [facility] ED, she reportedly had horizontal nystagmus, sluggish movement in arms and ataxia on the right.  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 She is on aspirin/Plavix at baseline. 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  Dr. Renold Don at [facility] and Dr. Wilford Corner with neurology here felt no interventions would be helpful other than anticoagulation. They do not have a neurologist; Dr. Wilford Corner agreed to  consult once the patient arrives.   Review of Systems: As per HPI otherwise 10 point review of systems negative.    Past Medical History:  Diagnosis Date  . CAD (coronary artery disease)   . Hyperlipidemia   . Hypertension       H/o smoking x 20 yrs x 1/2PPD , No alcohol or drug abuse. Lives alone with her dog. Daughter lives close by, [place]   Allergies  Allergen Reactions  . Codeine Other (See Comments)    Family History  Problem Relation Age of Onset  . Heart disease Father      Prior to Admission medications   Not on File    Physical Exam: Constitutional: NAD, calm, comfortable There were no vitals filed for this visit. Eyes: PERRL, lids and conjunctivae normal.  No nystagmus noted currently ENMT: Mucous membranes are moist. Posterior pharynx clear of any exudate or lesions.Normal dentition.  Neck: normal, supple, no masses, no thyromegaly Respiratory: clear to auscultation bilaterally, no wheezing, no crackles. Normal respiratory effort. No accessory muscle use.  Cardiovascular: Regular rate and rhythm, no murmurs / rubs / gallops. No extremity edema. 2+ pedal pulses. No carotid bruits.  Abdomen: no tenderness, no masses palpated. No hepatosplenomegaly. Bowel sounds positive.  Musculoskeletal: no clubbing / cyanosis. No joint deformity upper and lower extremities. Good ROM, no contractures. Normal muscle tone.  Neurologic: CN 2-12 grossly intact. Sensation intact, DTR normal. Strength 5/5 in all 4.  Right hemiparesis at baseline. Did not check gait.  Finger-to-nose within normal limits Psychiatric: Normal judgment and insight. Alert and oriented x 3. Normal mood.  SKIN/catheters: no rashes, lesions, ulcers. No induration  [facility]  CBC shows WBC 9.6 hemoglobin 12.4 hematocrit 36.7 platelets 312  PT 10.2 INR 1 PTT 22.2 BMP shows sodium 132 potassium 3.5 bicarb 28 glucose 143 BUN 14 creatinine 0.60.  proBNP 3150, troponin 0 0.03  chest x-ray showed bibasilar atelectasis MRI/MRA head showed  1.acute nonhemorrhagic left posterior inferior cerebellar infarct 2.  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  Diffuse medium and small vessel disease without other significant proximal stenosis, aneurysm or branch vessel occlusion 4.  Remote infarcts involving left insular cortex lentiform nucleus and operculum 5.  Atrophy and diffuse white matter disease advanced for age 89.  Mild degenerative changes of the cervical spine at C3-C4    Stat EKG obtained showing atrial fibrillation, no acute ST-T changes noted.  Assessment     Plan:   1. Vertigo/ ataxia: Secondary to posterior circulation compromise.  She does have prior history of CVA with residual right-sided weakness.  On asa ,Plavix and high-dose statins at home.  Resumed.  Neurology recommends anticoagulation. They will follow while here.  Can likely hold Plavix if patient is going to be on anticoagulation.  PT/OT evaluation once vertigo improves.  Neurochecks per protocol.  Lipid panel in a.m.  2.  New onset atrial fibrillation: Currently rate controlled.  Can consider beta-blockers if heart rate goes up.  Anticoagulation to be started when okay per neurology.  Pharmacy consulted for heparin.  Obtain echo  3. HTN: On Norvasc at home.  Usually controlled at SBP 130 to 140   , was elevated when EMS arrived at systolic 190 per patient.  Given posterior circulation compromise, permissive hypertension with systolic goal around 150.  4. CAD: S/P CABG in 2005. No stents recently. Can hold plavix   5. Hyperlipidemia : resume Lipitor 80 mg  6. Hyponatremia : Patient took salt tablets at one point, now just uses extra salt in diet. Labs ordered  7.  Fall/sacral pain: X-ray ordered  DVT prophylaxis: will be started on anticoagulation  Code Status: Full code    Consults called: Neurology Admission status: Patient admitted as inpatient as  anticipated LOS greater than 2 midnights    Alessandra BevelsNeelima Abree Romick MD [facility] Pager [phone]  If 7PM-7AM, please contact night-coverage [email or web address] Password [email or web address]  10/13/2017, 3:18 PM

## 2017-10-14 ENCOUNTER — Inpatient Hospital Stay (HOSPITAL_COMMUNITY): Payer: Medicare Other

## 2017-10-14 DIAGNOSIS — I2510 Atherosclerotic heart disease of native coronary artery without angina pectoris: Secondary | ICD-10-CM

## 2017-10-14 DIAGNOSIS — G458 Other transient cerebral ischemic attacks and related syndromes: Secondary | ICD-10-CM

## 2017-10-14 DIAGNOSIS — Z8673 Personal history of transient ischemic attack (TIA), and cerebral infarction without residual deficits: Secondary | ICD-10-CM

## 2017-10-14 DIAGNOSIS — I779 Disorder of arteries and arterioles, unspecified: Secondary | ICD-10-CM

## 2017-10-14 DIAGNOSIS — I63442 Cerebral infarction due to embolism of left cerebellar artery: Secondary | ICD-10-CM

## 2017-10-14 DIAGNOSIS — Z951 Presence of aortocoronary bypass graft: Secondary | ICD-10-CM

## 2017-10-14 DIAGNOSIS — I639 Cerebral infarction, unspecified: Secondary | ICD-10-CM

## 2017-10-14 DIAGNOSIS — I340 Nonrheumatic mitral (valve) insufficiency: Secondary | ICD-10-CM

## 2017-10-14 DIAGNOSIS — I361 Nonrheumatic tricuspid (valve) insufficiency: Secondary | ICD-10-CM

## 2017-10-14 DIAGNOSIS — E785 Hyperlipidemia, unspecified: Secondary | ICD-10-CM

## 2017-10-14 LAB — CBC
HEMATOCRIT: 35.1 % — AB (ref 36.0–46.0)
HEMOGLOBIN: 11.5 g/dL — AB (ref 12.0–15.0)
MCH: 29.7 pg (ref 26.0–34.0)
MCHC: 32.8 g/dL (ref 30.0–36.0)
MCV: 90.7 fL (ref 78.0–100.0)
Platelets: 289 10*3/uL (ref 150–400)
RBC: 3.87 MIL/uL (ref 3.87–5.11)
RDW: 14.2 % (ref 11.5–15.5)
WBC: 10.6 10*3/uL — AB (ref 4.0–10.5)

## 2017-10-14 LAB — HEPARIN LEVEL (UNFRACTIONATED)
Heparin Unfractionated: 0.35 IU/mL (ref 0.30–0.70)
Heparin Unfractionated: 0.49 IU/mL (ref 0.30–0.70)

## 2017-10-14 LAB — ECHOCARDIOGRAM COMPLETE
HEIGHTINCHES: 61 in
WEIGHTICAEL: 2560 [oz_av]

## 2017-10-14 LAB — LIPID PANEL
CHOLESTEROL: 120 mg/dL (ref 0–200)
HDL: 67 mg/dL (ref >40)
LDL Cholesterol: 45 mg/dL (ref 0–99)
Total CHOL/HDL Ratio: 1.8 RATIO
Triglycerides: 39 mg/dL (ref <150)
VLDL: 8 mg/dL (ref 0–40)

## 2017-10-14 LAB — HEMOGLOBIN A1C
Hgb A1c MFr Bld: 6.2 % — ABNORMAL HIGH (ref 4.8–5.6)
MEAN PLASMA GLUCOSE: 131.24 mg/dL

## 2017-10-14 LAB — POTASSIUM: Potassium: 3.7 mmol/L (ref 3.5–5.1)

## 2017-10-14 MED ORDER — ALPRAZOLAM 0.5 MG PO TABS: 0.5000 mg | ORAL_TABLET | Freq: Four times a day (QID) | ORAL | Status: DC | PRN

## 2017-10-14 MED ORDER — CARVEDILOL 6.25 MG PO TABS
6.2500 mg | ORAL_TABLET | Freq: Two times a day (BID) | ORAL | Status: DC
  Administered 2017-10-14: 6.25 mg via ORAL
  Filled 2017-10-14: qty 1

## 2017-10-14 MED ORDER — APIXABAN 5 MG PO TABS
5.0000 mg | ORAL_TABLET | Freq: Two times a day (BID) | ORAL | Status: DC
  Administered 2017-10-14 – 2017-10-15 (×3): 5 mg via ORAL
  Filled 2017-10-14 (×3): qty 1

## 2017-10-14 MED ORDER — CARVEDILOL 3.125 MG PO TABS
3.1250 mg | ORAL_TABLET | Freq: Two times a day (BID) | ORAL | Status: DC
  Administered 2017-10-14 – 2017-10-18 (×8): 3.125 mg via ORAL
  Filled 2017-10-14 (×8): qty 1

## 2017-10-14 MED ORDER — FAMOTIDINE 20 MG PO TABS
20.0000 mg | ORAL_TABLET | Freq: Every day | ORAL | Status: DC
  Administered 2017-10-15 – 2017-10-18 (×4): 20 mg via ORAL
  Filled 2017-10-14 (×4): qty 1

## 2017-10-14 MED ORDER — IOPAMIDOL (ISOVUE-370) INJECTION 76%
INTRAVENOUS | Status: AC
  Filled 2017-10-14: qty 50

## 2017-10-14 NOTE — Evaluation (Signed)
Physical Therapy Evaluation Patient Details Name: Diamond [name] MRN: [id number] DOB: May 04, 1940 Today's Date: 10/14/2017   History of Present Illness  Patient is a 77 y/o female presenting with dizziness and nausea, also with fall to buttocks within the home. 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    Clinical Impression  Diamond [name] is a pleasant 77 y/o female admitted with the above listed diagnosis. Patient reports that prior to admission, she lived alone and was independent with mobility. Patient today requiring Min A +2 for transfers and mobility for patient safety and stability. PT currently recommending follow-up therapy at discharge to continue to progress safe functional mobility for safe return home. PT to continue to follow acutely.    Follow Up Recommendations CIR    Equipment Recommendations  Other (comment)(TBD)    Recommendations for Other Services OT consult;Rehab consult     Precautions / Restrictions Precautions Precautions: Fall Restrictions Weight Bearing Restrictions: No      Mobility  Bed Mobility Overal bed mobility: Needs Assistance Bed Mobility: Supine to Sit     Supine to sit: Min assist     General bed mobility comments: Min A for trunk control   Transfers Overall transfer level: Needs assistance Equipment used: 2 person hand held assist Transfers: Sit to/from [employer] Sit to Stand: Min assist;+2 physical assistance Stand pivot transfers: Min assist;+2 physical assistance       General transfer comment: Min A +2 to power up from bedside; apteint fearful of falling. States she "feels drunk" when standing upright  Ambulation/Gait             General Gait Details: deferred per patient  preference  Stairs            Wheelchair Mobility    Modified Rankin (Stroke Patients Only) Modified Rankin (Stroke Patients Only) Pre-Morbid Rankin Score: No significant disability Modified Rankin: Moderately severe disability     Balance Overall balance assessment: Needs assistance Sitting-balance support: Bilateral upper extremity supported;Feet supported Sitting balance-Leahy Scale: Fair     Standing balance support: Bilateral upper extremity supported;During functional activity Standing balance-Leahy Scale: Poor Standing balance comment: reliant on external support                             Pertinent Vitals/Pain Pain Assessment: Faces Faces Pain Scale: Hurts little more Pain Location: buttocks Pain Descriptors / Indicators: Sore Pain Intervention(s): Limited activity within patient's tolerance;Monitored during session;Repositioned    Home Living Family/patient expects to be discharged to:: Private residence Living Arrangements: Alone   Type of Home: House Home Access: Level entry     Home Layout: One level Home Equipment: None Additional Comments: "I need grab bars"    Prior Function Level of Independence: Independent         Comments: has a dog that she cares for     Hand Dominance   Dominant Hand: Right    Extremity/Trunk Assessment   Upper Extremity Assessment Upper Extremity Assessment: Defer to OT evaluation    Lower Extremity Assessment Lower Extremity Assessment: Generalized weakness       Communication   Communication: No difficulties  Cognition Arousal/Alertness: Awake/alert Behavior During Therapy: WFL for tasks assessed/performed Overall Cognitive Status: Within Functional Limits for tasks assessed  General Comments      Exercises     Assessment/Plan    PT Assessment Patient needs continued PT services  PT Problem List Decreased  strength;Decreased activity tolerance;Decreased balance;Decreased mobility;Decreased coordination;Decreased knowledge of use of DME;Decreased safety awareness       PT Treatment Interventions DME instruction;Gait training;Stair training;Functional mobility training;Therapeutic activities;Therapeutic exercise;Balance training;Neuromuscular re-education;Patient/family education    PT Goals (Current goals can be found in the Care Plan section)  Acute Rehab PT Goals Patient Stated Goal: regain independence PT Goal Formulation: With patient Time For Goal Achievement: 10/28/17 Potential to Achieve Goals: Good    Frequency Min 4X/week   Barriers to discharge        Co-evaluation               AM-PAC PT "6 Clicks" Daily Activity  Outcome Measure Difficulty turning over in bed (including adjusting bedclothes, sheets and blankets)?: A Little Difficulty moving from lying on back to sitting on the side of the bed? : Unable Difficulty sitting down on and standing up from a chair with arms (e.g., wheelchair, bedside commode, etc,.)?: Unable Help needed moving to and from a bed to chair (including a wheelchair)?: A Little Help needed walking in hospital room?: A Little Help needed climbing 3-5 steps with a railing? : A Lot 6 Click Score: 13    End of Session Equipment Utilized During Treatment: Gait belt Activity Tolerance: Patient tolerated treatment well Patient left: in chair;with call bell/phone within reach;with chair alarm set Nurse Communication: Mobility status PT Visit Diagnosis: Unsteadiness on feet (R26.81);Other abnormalities of gait and mobility (R26.89);Muscle weakness (generalized) (M62.81)    Time: [id number] PT Time Calculation (min) (ACUTE ONLY): 34 min   Charges:   PT Evaluation $PT Eval Moderate Complexity: 1 Mod PT Treatments $Therapeutic Activity: 8-22 mins   PT G Codes:        Kipp LaurenceStephanie R [name], PT, DPT 10/14/17 2:18 PM Pager: [phone]

## 2017-10-14 NOTE — Progress Notes (Signed)
  Echocardiogram 2D Echocardiogram has been performed.  Diamond [name] 10/14/2017, 10:04 AM

## 2017-10-14 NOTE — Evaluation (Signed)
Speech Language Pathology Evaluation Patient Details Name: Diamond [name] MRN: [id number] DOB: 24-Nov-1940 Today's Date: 10/14/2017 Time: 1045-1100 SLP Time Calculation (min) (ACUTE ONLY): 15 min  Problem List:  Patient Active Problem List   Diagnosis Date Noted  . Acute ischemic stroke (HCC) 10/14/2017  . Vertebral artery disease (HCC) 10/13/2017  . Hyperlipidemia 01/03/2017  . Coronary artery disease involving native coronary artery of native heart without angina pectoris 10/30/2014  . Dyslipidemia 10/30/2014  . Essential hypertension 10/30/2014  . History of stroke 10/30/2014  . Hx of CABG 10/30/2014   Past Medical History:  Past Medical History:  Diagnosis Date  . Arthritis    "arms, hands, neck, shoulders; some in my knees" (10/13/2017)  . CAD (coronary artery disease)   . CVA (cerebral vascular accident) (HCC) 1995, 2001   /notes 08/11/2010; "weakness in right arm as a result" (10/13/2017)  . History of blood transfusion    "related to OHS"  . History of kidney stones   . Hyperlipidemia   . Hypertension   . New onset a-fib (HCC) 10/13/2017  . Pneumonia    "when I was little"  . Subclavian steal syndrome 2004   LUE/notes 10/13/2017  . TIA (transient ischemic attack) 2003   [name] 08/11/2010  . Vertigo 10/13/2017   ataxia/notes 10/13/2017   Past Surgical History:  Past Surgical History:  Procedure Laterality Date  . ABDOMINAL HYSTERECTOMY    . CARDIAC CATHETERIZATION  04/2003   [name] 08/11/2010  . CAROTID ARTERY - SUBCLAVIAN ARTERY BYPASS GRAFT Left 10/2002   carotid subclavian bypass with a 6 mm Dacron graft/notes 08/11/2010   . CARPAL TUNNEL RELEASE Right   . CHOLECYSTECTOMY OPEN    . CORONARY ANGIOPLASTY  04/2002   [name] 08/11/2010  . CORONARY ARTERY BYPASS GRAFT  05/2003   CABG X4/notes 08/11/2010  . CYSTOSCOPY W/ STONE MANIPULATION    . DILATION AND CURETTAGE OF UTERUS    . HERNIA REPAIR    . MEDIASTINAL EXPLORATION  05/2003   [name] 08/11/2010  . UMBILICAL  HERNIA REPAIR     HPI:  Diamond [name] is a 77 y.o. female past medical history of hyperlipidemia, hypertension and seizure.  Upon this consultation she currently is in atrial fibrillation.  Per patient she went to sleep at approximately 2200 hrs on Wednesday. Upon waking up in the middle of the night she suddenly felt severe vertigo and the feeling that she was going to throw up.  She tried to get out of her bed but did not make it to the bathroom and had emesis on the floor.  She made it back to her bed and continued to have vertigo and diaphoresis.  She continued to throw up throughout the night.  Patient called her family member who called EMS who then brought her to [facility].  MRI was obtained and showed a left inferior posterior cerebellar infarct in the PICA territory.  Patient was transferred to [facility] for further evaluation. She is significantly tired at this point in time. Patient has history of 3 strokes in the past.  She has residual weakness on the right side.   Assessment / Plan / Recommendation Clinical Impression  Evaluation was limited by pt's dizziness and resultant nausea. Currently pt's cognitive abilities appear functional. She is able to communicate wants and needs, oriented x4, follows directions (with eyes closed d/t dizziness), speech is intelligible, and is able to comprehend information related to POC. Currently ST is not indicated however as pt's dizziness resolves,  ST can be reconsulted if any underlying deficits appear. ST to sign off at this time.     SLP Assessment  SLP Recommendation/Assessment: Patient does not need any further Speech Lanaguage Pathology Services SLP Visit Diagnosis: Cognitive communication deficit (R41.841)    Follow Up Recommendations  None    Frequency and Duration           SLP Evaluation Cognition  Overall Cognitive Status: Within Functional Limits for tasks assessed Arousal/Alertness: Awake/alert Orientation Level: Oriented  X4 Attention: (impaired by dizziness)       Comprehension  Auditory Comprehension Overall Auditory Comprehension: Appears within functional limits for tasks assessed Visual Recognition/Discrimination Discrimination: Not tested Reading Comprehension Reading Status: Not tested    Expression Expression Primary Mode of Expression: Verbal Verbal Expression Overall Verbal Expression: Appears within functional limits for tasks assessed Written Expression Dominant Hand: Right Written Expression: Not tested   Oral / Motor  Oral Motor/Sensory Function Overall Oral Motor/Sensory Function: Within functional limits Motor Speech Overall Motor Speech: Appears within functional limits for tasks assessed   GO                    Brittinee Risk 10/14/2017, 1:25 PM

## 2017-10-14 NOTE — Progress Notes (Addendum)
Preliminary notes--Bilateral carotid duplex exam completed. Severely abnormal, dampened, preocclusive flow with a possible short segment of occlusive disease at the right distal CCA with retrograde flow in Right ECA.   Velocities in the left ICA are consistent with a 80-99% stenosis. A patent left subclavian artery to common carotid artery.    Left vertebral artery not be able to visualized.  Hongying Breckyn Troyer (RDMS RVT)   Tommi EmeryRita S (RDMS RVT) 10/14/17 1:30 PM

## 2017-10-14 NOTE — Progress Notes (Addendum)
PROGRESS NOTE    Diamond TALLY  [name]:[id number] DOB: [birth year] DOA: 10/13/2017 PCP: System, Pcp Not In   Brief Narrative: Patient is a 77 year old female with past medical history of hyperlipidemia, hypertension, seizures, strokes, coronary artery disease, status post CABG who was sent from [facility] after she presented there with complaints of dizziness.  It was associated with nausea and vomiting.  She was noted to be hypertensive on presentation.  MRI/MRI brain obtained at [facility] showed occlusion of the left  vertebral artery and acute left posterior inferior cerebellar infarct.  Also noted to be in new onset A. fib with RVR. She has been transferred here. Neurology has been consulted.  Patient has been started on anticoagulation with heparin drip for A. fib with RVR.  Assessment & Plan:   Principal Problem:   Acute ischemic stroke [facility]) Active Problems:   Coronary artery disease involving native coronary artery of native heart without angina pectoris   Dyslipidemia   Essential hypertension   History of stroke   Hx of CABG   Hyperlipidemia   Vertebral artery disease (HCC)  Acute ischemic stroke: MRA/MRI brain obtained at [facility] showed occlusion of the left  vertebral artery and acute left posterior inferior cerebellar infarct.  Neurology is following.  Undergoing echocardiogram and carotid Dopplers today.  She was on Plavix and aspirin at home.  Continue statin.  Patient has history of 3 strokes in the past.  She has residual weakness on the right side.  Currently she has been started on heparin drip.  Currently only on aspirin.  Awaiting PT/OT consultation, speech evaluation. Hemoglobin A1c of 6.2.  Vertigo/ataxia: Secondary to stroke in the posterior circulation.The symptoms have significantly improved today.  Continue meclizine as needed.  New onset A. fib: Currently rate is controlled. She is on carvedilol at home which will continue .Her heart rate is  fluctuating around 90-100.  She is undergoing echocardiogram.  She has been started on anti-correlation with heparin drip.  Anti-coagulation might be changed to Eliquis or Xarelto after echocardiogram report.  History of coronary artery disease: Status post CABG in 2005.  Was on Plavix and aspirin at home.  Currently Plavix on hold.  She follows with cardiology Dr. Tomie China.  Continue statin.  HTN: Currently she is normotensive.She is on carvedilol and amlodipine at home.  Hyperlipidemia:Resume Lipitor 80 mg daily.  Fall/sacral pain: X-ray did not show any fracture dislocation.  Hypokalemia: We will follow-up potassium level.  Headache: We will continue Tylenol as needed   DVT prophylaxis: Heparin drip Code Status: Full Family Communication: None present at the bedside Disposition Plan: Depends upon PT evaluation   Consultants: Neurology  Procedures: None  Antimicrobials: None  Subjective:  Patient seen and examined the bedside this morning.  Remains comfortable.  Denies any dizziness.  Complains of headache Objective: Vitals:   10/13/17 2355 10/14/17 0000 10/14/17 0200 10/14/17 0355  BP: 124/67   120/62  Pulse: 96 62 90 92  Resp: 16 16 14 16   Temp: 98.2 F (36.8 C)   98.1 F (36.7 C)  TempSrc: Oral   Oral  SpO2: 99% 97% 95% 99%  Weight:      Height:        Intake/Output Summary (Last 24 hours) at 10/14/2017 0902 Last data filed at 10/14/2017 0645 Gross per 24 hour  Intake 927.8 ml  Output 1450 ml  Net -522.2 ml   Filed Weights   10/13/17 1727  Weight: 72.6 kg (160 lb)  Examination:  General exam: Appears calm and comfortable ,Not in distress,average built HEENT:PERRL,Oral mucosa moist, Ear/Nose normal on gross exam Respiratory system: Bilateral equal air entry, normal vesicular breath sounds, no wheezes or crackles  Cardiovascular system: Atrial fibrillation. No JVD, murmurs, rubs, gallops or clicks. No pedal edema. Gastrointestinal system: Abdomen is  nondistended, soft and nontender. No organomegaly or masses felt. Normal bowel sounds heard. Central nervous system: Alert and oriented.  Right-sided residual weakness Extremities: No edema, no clubbing ,no cyanosis, distal peripheral pulses palpable. Skin: No rashes, lesions or ulcers,no icterus ,no pallor Psychiatry: Judgement and insight appear normal. Mood & affect appropriate.     Data Reviewed: I have personally reviewed following labs and imaging studies  CBC: Recent Labs  Lab 10/14/17 0230  WBC 10.6*  HGB 11.5*  HCT 35.1*  MCV 90.7  PLT 289   Basic Metabolic Panel: Recent Labs  Lab 10/13/17 1629  NA 135  K 3.4*  CL 97*  CO2 27  GLUCOSE 95  BUN 9  CREATININE 0.63  CALCIUM 8.8*   GFR: Estimated Creatinine Clearance: 53.6 mL/min (by C-G formula based on SCr of 0.63 mg/dL). Liver Function Tests: Recent Labs  Lab 10/13/17 1629  AST 26  ALT 18  ALKPHOS 64  BILITOT 0.9  PROT 6.7  ALBUMIN 3.6   No results for input(s): LIPASE, AMYLASE in the last 168 hours. No results for input(s): AMMONIA in the last 168 hours. Coagulation Profile: Recent Labs  Lab 10/13/17 1629  INR 1.02   Cardiac Enzymes: No results for input(s): CKTOTAL, CKMB, CKMBINDEX, TROPONINI in the last 168 hours. BNP (last 3 results) No results for input(s): PROBNP in the last 8760 hours. HbA1C: Recent Labs    10/14/17 0230  HGBA1C 6.2*   CBG: No results for input(s): GLUCAP in the last 168 hours. Lipid Profile: Recent Labs    10/14/17 0230  CHOL 120  HDL 67  LDLCALC 45  TRIG 39  CHOLHDL 1.8   Thyroid Function Tests: No results for input(s): TSH, T4TOTAL, FREET4, T3FREE, THYROIDAB in the last 72 hours. Anemia Panel: No results for input(s): VITAMINB12, FOLATE, FERRITIN, TIBC, IRON, RETICCTPCT in the last 72 hours. Sepsis Labs: No results for input(s): PROCALCITON, LATICACIDVEN in the last 168 hours.  No results found for this or any previous visit (from the past 240  hour(s)).       Radiology Studies: Dg Sacrum/coccyx  Result Date: 10/13/2017 CLINICAL DATA:  Fall onto tailbone EXAM: SACRUM AND COCCYX - 2+ VIEW COMPARISON:  None. FINDINGS: There is no evidence of fracture or other focal bone lesions. IMPRESSION: Negative. Electronically Signed   By: Deatra Robinson M.D.   On: 10/13/2017 22:55   Ct Head Wo Contrast  Result Date: 10/14/2017 CLINICAL DATA:  77 y/o  F; follow-up of stroke. EXAM: CT HEAD WITHOUT CONTRAST TECHNIQUE: Contiguous axial images were obtained from the base of the skull through the vertex without intravenous contrast. COMPARISON:  10/13/2017 MRI of the head. FINDINGS: Brain: Stable chronic left perisylvian infarct. Stable distribution of left inferior cerebellum acute infarction without associated mass effect or hemorrhage. No new stroke, hemorrhage, extra-axial collection, hydrocephalus, or herniation. Stable chronic microvascular ischemic changes and volume loss of the brain. Vascular: Calcific atherosclerosis of the carotid siphons and vertebral arteries. Skull: Normal. Negative for fracture or focal lesion. Sinuses/Orbits: No acute finding. Other: None. IMPRESSION: 1. Stable distribution of left inferior cerebellum acute infarction. No associated mass effect or hemorrhage. 2. No new acute intracranial abnormality identified. 3. Stable  background of chronic microvascular ischemic changes, volume loss, and chronic infarcts of the brain. Electronically Signed   By: Mitzi HansenLance  Furusawa-Stratton M.D.   On: 10/14/2017 01:14        Scheduled Meds: . aspirin EC  81 mg Oral Daily  . atorvastatin  80 mg Oral q1800  . carvedilol  6.25 mg Oral BID   Continuous Infusions: . heparin 850 Units/hr (10/14/17 0600)  . lactated ringers 75 mL/hr at 10/14/17 0600     LOS: 1 day    Time spent: 35  mins.More than 50% of that time was spent in counseling and/or coordination of care.      Burnadette PopAmrit Renne Cornick, MD [facility] Pager  [phone]  If 7PM-7AM, please contact night-coverage [email or web address] Password [email or web address] 10/14/2017, 9:02 AM

## 2017-10-14 NOTE — Progress Notes (Signed)
Rehab Admissions Coordinator Note:  Patient was screened by Clois DupesBoyette, Kalicia Dufresne Godwin for appropriateness for an Inpatient Acute Rehab Consult per PT and OT recommendation.  At this time, we are recommending Inpatient Rehab consult if pt would like to be considered for admit. I will contact Attending MD for order.  Clois DupesBoyette, Leny Morozov Godwin 10/14/2017, 3:27 PM  I can be reached at [phone].

## 2017-10-14 NOTE — Progress Notes (Signed)
Patient at CT for CTA, now stating that she has a contrast allergy. Unable to perform CTA.   An MRA head obtained earlier revealed an occluded left vertebral artery, with MRI showing corresponding PICA territory ischemic infarction on the left.   Of note, carotid ultrasound showed RIght internal carotid artery is patent with abnormal waveforms noted throughout suggestive of proximal occlusion. There is retrograde flow in the external carotid artery and abnormal right subclavian monophasic flow. Velocities in the left ICA are consistent with a 80-99% stenosis. A patent left subclavian artery to common carotid arterybypass graft is noted.   The patient may be a candidate for CEA. Will defer to Stroke Team for final decision.   Electronically signed: Dr. Caryl PinaEric Penney Domanski

## 2017-10-14 NOTE — Evaluation (Signed)
Occupational Therapy Evaluation Patient Details Name: Diamond [name] MRN: [id number] DOB: [birth year] Today's Date: 10/14/2017    History of Present Illness Patient is a 77 y/o female presenting with dizziness and nausea, also with fall to buttocks within the home. 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   Clinical Impression   Limited OT eval secondary to pt's dizziness, nausea, and fatigue.  Currently, feel she needs at least mod assist for basic selfcare tasks and functional transfers sit to stand.  Completed stand pivot transfers at this level back to bed from bedside chair.  She exhibited constant horizontal nystagmus in sitting in the bedside recliner, which increased her nausea level.  She did report a slight improvement in her nausea when she closed her eyes however.  Feel she will benefit from acute care OT to work on progression of ADL function and education of habituation exercises to help decrease over dizziness.  Will benefit from follow-up CIR level therapy to reach supervision level for discharge home if family can piece together.  Pt lived alone and was completely independent prior to this admission.     Follow Up Recommendations  CIR;Supervision - Intermittent    Equipment Recommendations  3 in 1 bedside commode;Tub/shower bench    Recommendations for Other Services Rehab consult     Precautions / Restrictions Precautions Precautions: Fall Precaution Comments: constant nystagmus Restrictions Weight Bearing Restrictions: No      Mobility Bed Mobility Overal bed mobility: Needs Assistance Bed Mobility: Supine to Sit     Supine to sit: Min assist     General bed mobility comments: Min A for trunk control   Transfers Overall transfer  level: Needs assistance Equipment used: 2 person hand held assist Transfers: Stand Pivot Transfers Sit to Stand: Mod assist Stand pivot transfers: Mod assist       General transfer comment: Min A +2 to power up from bedside; apteint fearful of falling. States she "feels drunk" when standing upright    Balance Overall balance assessment: Needs assistance Sitting-balance support: Bilateral upper extremity supported;Feet supported Sitting balance-Leahy Scale: Fair     Standing balance support: Bilateral upper extremity supported;During functional activity Standing balance-Leahy Scale: Poor Standing balance comment: Pt needing therapist support to maintain standing balance with stand pivot transfers.                            ADL either performed or assessed with clinical judgement   ADL Overall ADL's : Needs assistance/impaired Eating/Feeding: Set up   Grooming: Set up Grooming Details (indicate cue type and reason): simulated                 Toilet Transfer: Moderate assistance;Stand-pivot   Toileting- Clothing Manipulation and Hygiene: Moderate assistance;Sit to/from stand         General ADL Comments: Pt needed mod assist for stand pivot transfer to the bed.  Limited evaluation secondary to pt's dizziness, nausea, and fatigue.  Noted horizontal nystagmus at rest when sitting on the EOC with BUE support.  This did not subside as pt sat and she reported feeling more nauseas.  She states that closing her eyes helped some with this.  Did not attempt ADLs as pt was too sick/fatigued to participate.  Discussed with pt's daughter and  son the need for follow-up theapy and likely some PRN supervision at discharge.  Recommend CIR consult.       Vision Baseline Vision/History: Wears glasses Wears Glasses: At all times Vision Assessment?: Yes Eye Alignment: Impaired (comment)(head tilt to the right) Tracking/Visual Pursuits: Other (comment)(limited assessment  secondary to dizziness and pt's ability to maintain attention secondary to nausea and dizziness.  Pt with resting horizontal nystagmus bilaterally that did not improve after one minute.  ) Additional Comments: Pt was able to identify fingers therapist was holding up in all visual fields.  She did demonstrate jerky tracking secondary to nystagmus but was limited with maintaining visual fixation on moving target secondary to nausea and dizziness.  Will continue to further eval in treatment.             Pertinent Vitals/Pain Pain Assessment: No/denies pain Faces Pain Scale: Hurts little more Pain Location: buttocks Pain Descriptors / Indicators: Sore Pain Intervention(s): Limited activity within patient's tolerance;Monitored during session;Repositioned     Hand Dominance Right   Extremity/Trunk Assessment Upper Extremity Assessment Upper Extremity Assessment: Generalized weakness;RUE deficits/detail;LUE deficits/detail RUE Deficits / Details: Pt with history of residual weakness in the LUE from previous CVA.  Overall shoulder strength 3-/5 with elbow flexion/extension 3/5, and grip 3/5 RUE Sensation: WNL(Pt reports light touch grossly intact but not formally assessed.) RUE Coordination: decreased fine motor LUE Deficits / Details: Genralized weakness with AROM shoulder flexion 0-100 degrees.  Strength overall 3-/5 at the shoulder with 3/5 at elbow and for grip.  Pt distracted by nausea and dizziness so may do better once some of this is resolved.   LUE Sensation: WNL   Lower Extremity Assessment Lower Extremity Assessment: Defer to PT evaluation   Cervical / Trunk Assessment Cervical / Trunk Assessment: Kyphotic   Communication Communication Communication: No difficulties   Cognition Arousal/Alertness: Awake/alert Behavior During Therapy: Anxious Overall Cognitive Status: Within Functional Limits for tasks assessed                                 General Comments: Pt  anxious secondary to dizziness and feeling sick, ready to get back in the bed.  Needed mod re-direction at times to relax and listen to therapist.  Will continue to further assess cognition during function.              Home Living Family/patient expects to be discharged to:: Private residence Living Arrangements: Alone Available Help at Discharge: Available PRN/intermittently(son, daughter, and other family can assist some but not 24 hr) Type of Home: House Home Access: Level entry     Home Layout: One level     Bathroom Shower/Tub: Chief Strategy Officer: Standard     Home Equipment: None   Additional Comments: "I need grab bars"      Prior Functioning/Environment Level of Independence: Independent        Comments: has a dog that she cares for        OT Problem List: Decreased strength;Impaired balance (sitting and/or standing);Impaired vision/perception;Decreased activity tolerance;Decreased knowledge of use of DME or AE      OT Treatment/Interventions: Self-care/ADL training;DME and/or AE instruction;Therapeutic activities;Balance training;Patient/family education;Visual/perceptual remediation/compensation    OT Goals(Current goals can be found in the care plan section) Acute Rehab OT Goals Patient Stated Goal: To do for herself again OT Goal Formulation: With patient/family Time For Goal Achievement: 10/28/17 Potential to Achieve Goals: Good  OT  Frequency: Min 2X/week              AM-PAC PT "6 Clicks" Daily Activity     Outcome Measure Help from another person eating meals?: A Little Help from another person taking care of personal grooming?: A Little Help from another person toileting, which includes using toliet, bedpan, or urinal?: A Lot Help from another person bathing (including washing, rinsing, drying)?: A Lot Help from another person to put on and taking off regular upper body clothing?: A Little Help from another person to put on  and taking off regular lower body clothing?: A Lot 6 Click Score: 15   End of Session Nurse Communication: Mobility status  Activity Tolerance: Other (comment)(treatment limited secondary to fatigue and dizziness) Patient left: in bed;with call bell/phone within reach;with family/visitor present  OT Visit Diagnosis: Unsteadiness on feet (R26.81);Muscle weakness (generalized) (M62.81);Dizziness and giddiness (R42)                Time: [id number] OT Time Calculation (min): 25 min Charges:  OT General Charges $OT Visit: 1 Visit OT Evaluation $OT Eval Moderate Complexity: 1 Mod OT Treatments $Self Care/Home Management : 8-22 mins    Siera Beyersdorf OTR/L 10/14/2017, 3:10 PM

## 2017-10-14 NOTE — Progress Notes (Signed)
ANTICOAGULATION CONSULT NOTE   Pharmacy Consult for Heparin Indication: atrial fibrillation and vertebral stenosis, new stroke  Allergies  Allergen Reactions  . Codeine Other (See Comments)    Patient Measurements: Height: 5\' 1"  (154.9 cm) Weight: 160 lb (72.6 kg) IBW/kg (Calculated) : 47.8 Heparin Dosing Weight: 64 kg  Vital Signs: Temp: 98.1 F (36.7 C) (07/19 0355) Temp Source: Oral (07/19 0355) BP: 98/42 (07/19 1245) Pulse Rate: 75 (07/19 1245)  [facility]  INR 1.0, aPTT 22.2 seconds. Hgb 12.4, platelet count 312.  Creatinine 0.6, K+ 3.5, Na 132, LFTs normal  Recent Labs    10/13/17 1629 10/14/17 0230 10/14/17 1039  HGB  --  11.5*  --   HCT  --  35.1*  --   PLT  --  289  --   LABPROT 13.3  --   --   INR 1.02  --   --   HEPARINUNFRC  --  0.35 0.49  CREATININE 0.63  --   --     Assessment: 77 yr old [facility].  New onset atrial fibrillation and stroke; vertebral stenosis. On Aspirin and Plavix prior to admission.  [facility]  Heparin level is therapeutic at 0.49 on 850 units/hr. No bleeding noted, CBC stable.  Goal of Therapy:  Heparin level 0.3-0.5 units/ml (for stroke) Monitor platelets by anticoagulation protocol: Yes   Plan:  Continue heparin drip at 850 units/hr Daily heparin level and CBC Monitor for s/sx of bleeding   Loura BackJennifer Lilburn, PharmD, BCPS Clinical Pharmacist Clinical phone for 10/14/2017 until 3p is [phone] Please check AMION for all Pharmacist numbers by unit 10/14/2017 1:37 PM

## 2017-10-14 NOTE — Progress Notes (Addendum)
STROKE TEAM PROGRESS NOTE   SUBJECTIVE (INTERVAL HISTORY) Her daughter, son in law (ophthalmologist), granddaughters and sister are at the bedside.  Pt felt much better than yesterday.  No more vertigo, nausea vomiting, but still feels tired and wobbly on walking.  Carotid Doppler showed significant vascular changes (see below), will need CTA head and neck.  Possible cerebral angiogram too.    OBJECTIVE Temp:  [98.1 F (36.7 C)-98.6 F (37 C)] 98.1 F (36.7 C) (07/19 0355) Pulse Rate:  [33-96] 64 (07/19 0922) Cardiac Rhythm: Atrial fibrillation (07/19 0800) Resp:  [14-20] 17 (07/19 0922) BP: (111-153)/(47-103) 111/47 (07/19 0922) SpO2:  [94 %-99 %] 99 % (07/19 0355) Weight:  [160 lb (72.6 kg)] 160 lb (72.6 kg) (07/18 1727)  CBC:  Recent Labs  Lab 10/14/17 0230  WBC 10.6*  HGB 11.5*  HCT 35.1*  MCV 90.7  PLT 289    Basic Metabolic Panel:  Recent Labs  Lab 10/13/17 1629 10/14/17 0906  NA 135  --   K 3.4* 3.7  CL 97*  --   CO2 27  --   GLUCOSE 95  --   BUN 9  --   CREATININE 0.63  --   CALCIUM 8.8*  --     Lipid Panel:     Component Value Date/Time   CHOL 120 10/14/2017 0230   TRIG 39 10/14/2017 0230   HDL 67 10/14/2017 0230   CHOLHDL 1.8 10/14/2017 0230   VLDL 8 10/14/2017 0230   LDLCALC 45 10/14/2017 0230   HgbA1c:  Lab Results  Component Value Date   HGBA1C 6.2 (H) 10/14/2017   Urine Drug Screen:     Component Value Date/Time   LABOPIA NONE DETECTED 10/13/2017 1749   COCAINSCRNUR NONE DETECTED 10/13/2017 1749   LABBENZ NONE DETECTED 10/13/2017 1749   AMPHETMU NONE DETECTED 10/13/2017 1749   THCU NONE DETECTED 10/13/2017 1749   LABBARB (A) 10/13/2017 1749    Result not available. Reagent lot number recalled by manufacturer.    Alcohol Level No results found for: ETH  IMAGING  Ct Head Wo Contrast 10/14/2017 IMPRESSION:  1. Stable distribution of left inferior cerebellum acute infarction. No associated mass effect or hemorrhage. 2. No new  acute intracranial abnormality identified.  3. Stable background of chronic microvascular ischemic changes, volume loss, and chronic infarcts of the brain.   Transthoracic Echocardiogram  10/14/2017 Study Conclusions - Left ventricle: The cavity size was normal. There was moderate   concentric hypertrophy. Systolic function was vigorous. The   estimated ejection fraction was in the range of 65% to 70%. Wall   motion was normal; there were no regional wall motion   abnormalities. - Aortic valve: There was mild regurgitation. Valve area (VTI): 2.6   cm^2. Valve area (Vmax): 2.02 cm^2. Valve area (Vmean): 2.47   cm^2. - Mitral valve: There was mild regurgitation. - Left atrium: The atrium was mildly dilated. - Right atrium: The atrium was mildly dilated. - Tricuspid valve: There was moderate regurgitation. - Pulmonary arteries: Systolic pressure was mildly increased. PA   peak pressure: 37 mm Hg (S). Impressions: - Hyperdynamic LVEF. Thickened aortic valve leaflets and   significant calcifications in the coronary sinuses, no aortic   stenosis, mild regurgitation. Mild mitral and moderate tricuspid   regurgitation. PM in place. Mildly dilated ascending aorta   measuring 41 mm.  Bilateral Carotid Dopplers  Severely abnormal, dampened, preocclusive flow with a possible short segment of occlusive disease at the right distal CCA with retrograde flow  in Right ECA.   Velocities in the left ICA are consistent with a 80-99% stenosis. A patent left subclavian artery to common carotid artery.    Left vertebral artery not be able to visualized.   CTA head and neck pending   PHYSICAL EXAM  Temp:  [98 F (36.7 C)-98.6 F (37 C)] 98 F (36.7 C) (07/19 1621) Pulse Rate:  [62-96] 88 (07/19 1445) Resp:  [13-21] 15 (07/19 1445) BP: (98-136)/(42-75) 136/53 (07/19 1621) SpO2:  [92 %-99 %] 96 % (07/19 1447)  General - Well nourished, well developed, mild lethargy.  Ophthalmologic - fundi not  visualized due to noncooperation.  Cardiovascular - irregularly irregular heart rate and rhythm.  Mental Status -  Level of arousal and orientation to time, place, and person were intact. Language including expression, naming, repetition, comprehension was assessed and found intact.  Cranial Nerves II - XII - II - Visual field intact OU. III, IV, VI - Extraocular movements intact. V - Facial sensation intact bilaterally. VII - Facial movement intact bilaterally. VIII - Hearing & vestibular intact bilaterally, no nystagmus. X - Palate elevates symmetrically. XI - Chin turning & shoulder shrug intact bilaterally. XII - Tongue protrusion intact.  Motor Strength - The patient's strength was normal in all extremities except left lower extremity 4+/5 and left ankle dorsiflexion 4/5 and pronator drift was absent.  Bulk was normal and fasciculations were absent.   Motor Tone - Muscle tone was assessed at the neck and appendages and was normal.  Reflexes - The patient's reflexes were symmetrical in all extremities and she had no pathological reflexes.  Sensory - Light touch, temperature/pinprick were assessed and were symmetrical.    Coordination - The patient had normal movements in the hands and left foot with no ataxia or dysmetria, not able to perform on right foot due to weakness.  Tremor was absent.  Gait and Station - deferred.   ASSESSMENT/PLAN Diamond [name] is a 77 y.o. female with history of a previous stroke, hypertension, hyperlipidemia, coronary artery disease, subclavian steal syndrome, and new onset atrial fibrillation presenting with vertigo, diaphoresis, nausea and vomiting. She did not receive IV t-PA due to late presentation.  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 large vessel disease vs. steal syndrome.  Resultant  Chronic right LE weakness and mild foot drop  CT head - left inferior cerebellum  acute infarction.  MRI head - left PICA territory infarct, chronic left MCA infarct  MRA head - left VA occlusion, left PICA not visualized  Carotid Doppler - Severely abnormal, dampened, preocclusive flow with a possible short segment of occlusive disease at the right distal CCA with retrograde flow in Right ECA.   Velocities in the left ICA are consistent with a 80-99% stenosis. A patent left subclavian artery to common carotid artery.    Left vertebral artery not be able to visualized.  2D Echo - EF 65% to 70%. No cardiac source of emboli identified.  CTA head and neck pending  LDL - 45  HgbA1c - 6.2  VTE prophylaxis - IV heparin   aspirin 81 mg daily and clopidogrel 75 mg daily prior to admission, now on aspirin 81 mg daily and Eliquis (apixaban) daily. Off IV heparin.   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  Pt current left PICA infarct could be due to afib, or left VA occlusion, or due to subclavian steal, pending further CTA head and neck or cerebral angiogram  PAF, new diagnosis  Tele showed afib in ER  Was on heparin drip  Now transition to eliquis  Rate controlled  CAD s/p CABG  2005  Following with  cardiology, no afib found  Until this admission  Was on ASA and plavix  plavix stopped after starting eliquis  Continue ASA   Hypertension  Stable . Permissive hypertension (OK if < 220/120) but gradually normalize in 5-7 days . Long-term BP goal 130-150 due to large vessel stenosis/occlusion  Hyperlipidemia  Lipid lowering medication PTA: Lipitor 80 mg daily  LDL 45, goal < 70  Current lipid lowering medication: Lipitor 80 mg daily  Continue statin at discharge  Other Stroke Risk Factors  Advanced age  Former cigarette smoker - quit  ETOH use, advised to drink no more than 1 alcoholic beverage per day.  Obesity, Body mass index is 30.23 kg/m., recommend weight loss, diet and exercise as appropriate   Other Active Problems  Seizure disorder on [facility] day # 1  Marvel Plan, MD PhD Stroke Neurology 10/14/2017 9:05 PM  I spent  35 minutes in total face-to-face time with the patient, more than 50% of which was spent in counseling and coordination of care, reviewing test results, images and medication, and discussing the diagnosis of multifocal cerebral vascular stenosis/occlusion, left PICA stroke, afib new onset, anticoagulatoin, treatment plan and potential prognosis. This patient's care requiresreview of multiple databases, neurological assessment, discussion with family, other specialists and medical decision making of high complexity.  To contact Stroke Continuity provider, please refer to [email or web address]. After hours, contact General Neurology

## 2017-10-14 NOTE — Plan of Care (Signed)
Patient stable, discussed POC with patient, denies question/concerns at this time.  

## 2017-10-14 NOTE — Progress Notes (Signed)
Bladder scanned patient due to no voiding. Bladder scan showed max 300. Will follow protocol and re-scan in 2 hours. Reported to pm nurse Rosana FretAmanda Sutton RN.

## 2017-10-14 NOTE — Progress Notes (Signed)
ANTICOAGULATION CONSULT NOTE   Pharmacy Consult for Heparin Indication: atrial fibrillation and vertebral stenosis, new stroke  Allergies  Allergen Reactions  . Codeine Other (See Comments)    Patient Measurements: Height: 5\' 1"  (154.9 cm) Weight: 160 lb (72.6 kg) IBW/kg (Calculated) : 47.8 Heparin Dosing Weight: 64 kg  Vital Signs: Temp: 98.2 F (36.8 C) (07/18 2355) Temp Source: Oral (07/18 2355) BP: 124/67 (07/18 2355) Pulse Rate: 96 (07/18 2355)  [facility]:  INR 1.0, aPTT 22.2 seconds. Hgb 12.4, platelet count 312.  Creatinine 0.6, K+ 3.5, Na 132, LFTs normal  Recent Labs    10/13/17 1629 10/14/17 0230  HGB  --  11.5*  HCT  --  35.1*  PLT  --  289  LABPROT 13.3  --   INR 1.02  --   HEPARINUNFRC  --  0.35  CREATININE 0.63  --    Medical History: Past Medical History:  Diagnosis Date  . Arthritis    "arms, hands, neck, shoulders; some in my knees" (10/13/2017)  . CAD (coronary artery disease)   . CVA (cerebral vascular accident) (HCC) 1995, 2001   /notes 08/11/2010; "weakness in right arm as a result" (10/13/2017)  . History of blood transfusion    "related to [facility]"  . History of kidney stones   . Hyperlipidemia   . Hypertension   . New onset a-fib (HCC) 10/13/2017  . Pneumonia    "when I was little"  . Subclavian steal syndrome 2004   LUE/notes 10/13/2017  . TIA (transient ischemic attack) 2003   [name]/notes 08/11/2010  . Vertigo 10/13/2017   ataxia/notes 10/13/2017   Assessment: 77 yr old [facility].  New onset atrial fibrillation and stroke; vertebral stenosis. On Aspirin and Plavix prior to admission.  [facility]  7/19 AM update: initial heparin level is therapeutic   Goal of Therapy:  Heparin level 0.3-0.5 units/ml (low therapeutic goal for stroke) Monitor platelets by anticoagulation protocol: Yes   Plan:   Cont heparin drip at 850  units/hr.  Heparin level ~8 hrs to confirm  Daily heparin level and CBC while on heparin.  Abran DukeJames Makayela Secrest, PharmD, BCPS Clinical Pharmacist Phone: [phone]

## 2017-10-14 NOTE — Progress Notes (Signed)
Dr. Otelia LimesLindzen made aware of CT results showing cerebellar infarct.

## 2017-10-14 NOTE — Progress Notes (Addendum)
Benefit check sent for Eliquis and Xaralto. Will update when resulted.  Patient has medicaid. Both are covered.

## 2017-10-15 ENCOUNTER — Inpatient Hospital Stay (HOSPITAL_COMMUNITY): Payer: Medicare Other

## 2017-10-15 ENCOUNTER — Other Ambulatory Visit: Payer: Self-pay

## 2017-10-15 LAB — BASIC METABOLIC PANEL
ANION GAP: 8 (ref 5–15)
BUN: 8 mg/dL (ref 8–23)
CHLORIDE: 92 mmol/L — AB (ref 98–111)
CO2: 31 mmol/L (ref 22–32)
Calcium: 8.5 mg/dL — ABNORMAL LOW (ref 8.9–10.3)
Creatinine, Ser: 0.63 mg/dL (ref 0.44–1.00)
GFR calc non Af Amer: >60 mL/min (ref >60)
GLUCOSE: 118 mg/dL — AB (ref 70–99)
POTASSIUM: 3.6 mmol/L (ref 3.5–5.1)
Sodium: 131 mmol/L — ABNORMAL LOW (ref 135–145)

## 2017-10-15 LAB — URINALYSIS, ROUTINE W REFLEX MICROSCOPIC
Bilirubin Urine: NEGATIVE
Glucose, UA: NEGATIVE mg/dL
Hgb urine dipstick: NEGATIVE
Ketones, ur: NEGATIVE mg/dL
Leukocytes, UA: NEGATIVE
NITRITE: NEGATIVE
Protein, ur: NEGATIVE mg/dL
SPECIFIC GRAVITY, URINE: 1.014 (ref 1.005–1.030)
pH: 6 (ref 5.0–8.0)

## 2017-10-15 LAB — CBC
HEMATOCRIT: 35.5 % — AB (ref 36.0–46.0)
HEMOGLOBIN: 11.5 g/dL — AB (ref 12.0–15.0)
MCH: 29.5 pg (ref 26.0–34.0)
MCHC: 32.4 g/dL (ref 30.0–36.0)
MCV: 91 fL (ref 78.0–100.0)
Platelets: 265 10*3/uL (ref 150–400)
RBC: 3.9 MIL/uL (ref 3.87–5.11)
RDW: 13.8 % (ref 11.5–15.5)
WBC: 8.6 10*3/uL (ref 4.0–10.5)

## 2017-10-15 LAB — GLUCOSE, CAPILLARY: Glucose-Capillary: 104 mg/dL — ABNORMAL HIGH (ref 70–99)

## 2017-10-15 NOTE — Progress Notes (Addendum)
STROKE TEAM PROGRESS NOTE   SUBJECTIVE (INTERVAL HISTORY) Her family is not at the bedside.   arotid Doppler showed significant vascular changes (see below), will need   cerebral angiogram too.She is willing for prep prior to procedure for her dye allergy    OBJECTIVE Temp:  [98 F (36.7 C)-98.5 F (36.9 C)] 98.1 F (36.7 C) (07/20 0748) Pulse Rate:  [46-165] 91 (07/20 1153) Cardiac Rhythm: Atrial fibrillation (07/20 0800) Resp:  [13-21] 16 (07/20 1153) BP: (98-140)/(42-75) 140/60 (07/20 0748) SpO2:  [85 %-99 %] 93 % (07/20 1154)  CBC:  Recent Labs  Lab 10/14/17 0230 10/15/17 0337  WBC 10.6* 8.6  HGB 11.5* 11.5*  HCT 35.1* 35.5*  MCV 90.7 91.0  PLT 289 265    Basic Metabolic Panel:  Recent Labs  Lab 10/13/17 1629 10/14/17 0906 10/15/17 0337  NA 135  --  131*  K 3.4* 3.7 3.6  CL 97*  --  92*  CO2 27  --  31  GLUCOSE 95  --  118*  BUN 9  --  8  CREATININE 0.63  --  0.63  CALCIUM 8.8*  --  8.5*    Lipid Panel:     Component Value Date/Time   CHOL 120 10/14/2017 0230   TRIG 39 10/14/2017 0230   HDL 67 10/14/2017 0230   CHOLHDL 1.8 10/14/2017 0230   VLDL 8 10/14/2017 0230   LDLCALC 45 10/14/2017 0230   HgbA1c:  Lab Results  Component Value Date   HGBA1C 6.2 (H) 10/14/2017   Urine Drug Screen:     Component Value Date/Time   LABOPIA NONE DETECTED 10/13/2017 1749   COCAINSCRNUR NONE DETECTED 10/13/2017 1749   LABBENZ NONE DETECTED 10/13/2017 1749   AMPHETMU NONE DETECTED 10/13/2017 1749   THCU NONE DETECTED 10/13/2017 1749   LABBARB (A) 10/13/2017 1749    Result not available. Reagent lot number recalled by manufacturer.    Alcohol Level No results found for: ETH  IMAGING  Ct Head Wo Contrast 10/14/2017 IMPRESSION:  1. Stable distribution of left inferior cerebellum acute infarction. No associated mass effect or hemorrhage. 2. No new acute intracranial abnormality identified.  3. Stable background of chronic microvascular ischemic changes,  volume loss, and chronic infarcts of the brain.   Transthoracic Echocardiogram  10/14/2017 Study Conclusions - Left ventricle: The cavity size was normal. There was moderate   concentric hypertrophy. Systolic function was vigorous. The   estimated ejection fraction was in the range of 65% to 70%. Wall   motion was normal; there were no regional wall motion   abnormalities. - Aortic valve: There was mild regurgitation. Valve area (VTI): 2.6   cm^2. Valve area (Vmax): 2.02 cm^2. Valve area (Vmean): 2.47   cm^2. - Mitral valve: There was mild regurgitation. - Left atrium: The atrium was mildly dilated. - Right atrium: The atrium was mildly dilated. - Tricuspid valve: There was moderate regurgitation. - Pulmonary arteries: Systolic pressure was mildly increased. PA   peak pressure: 37 mm Hg (S). Impressions: - Hyperdynamic LVEF. Thickened aortic valve leaflets and   significant calcifications in the coronary sinuses, no aortic   stenosis, mild regurgitation. Mild mitral and moderate tricuspid   regurgitation. PM in place. Mildly dilated ascending aorta   measuring 41 mm.  Bilateral Carotid Dopplers  Severely abnormal, dampened, preocclusive flow with a possible short segment of occlusive disease at the right distal CCA with retrograde flow in Right ECA.   Velocities in the left ICA are consistent with a  80-99% stenosis. A patent left subclavian artery to common carotid artery.    Left vertebral artery not be able to visualized.   Cerebral angio pending   PHYSICAL EXAM  Temp:  [98 F (36.7 C)-98.5 F (36.9 C)] 98.1 F (36.7 C) (07/20 0748) Pulse Rate:  [46-165] 91 (07/20 1153) Resp:  [13-21] 16 (07/20 1153) BP: (98-140)/(42-75) 140/60 (07/20 0748) SpO2:  [85 %-99 %] 93 % (07/20 1154)  General - Well nourished, well developed,elderly caucasain lady.   Ophthalmologic - fundi not visualized due to noncooperation.  Cardiovascular - irregularly irregular heart rate and  rhythm.  Mental Status -  Level of arousal and orientation to time, place, and person were intact. Language including expression, naming, repetition, comprehension was assessed and found intact.  Cranial Nerves II - XII - II - Visual field intact OU. III, IV, VI - Extraocular movements intact. V - Facial sensation intact bilaterally. VII - Facial movement intact bilaterally. VIII - Hearing & vestibular intact bilaterally, no nystagmus. X - Palate elevates symmetrically. XI - Chin turning & shoulder shrug intact bilaterally. XII - Tongue protrusion intact.  Motor Strength - The patient's strength was normal in all extremities except left lower extremity 4+/5 and left ankle dorsiflexion 4/5 and pronator drift was absent.  Bulk was normal and fasciculations were absent.   Motor Tone - Muscle tone was assessed at the neck and appendages and was normal.  Reflexes - The patient's reflexes were symmetrical in all extremities and she had no pathological reflexes.  Sensory - Light touch, temperature/pinprick were assessed and were symmetrical.    Coordination - The patient had normal movements in the hands and left foot with no ataxia or dysmetria, not able to perform on right foot due to weakness.  Tremor was absent.  Gait and Station - deferred.   ASSESSMENT/PLAN Diamond [name] is a 77 y.o. female with history of a previous stroke, hypertension, hyperlipidemia, coronary artery disease, subclavian steal syndrome, and new onset atrial fibrillation presenting with vertigo, diaphoresis, nausea and vomiting. She did not receive IV t-PA due to late presentation.  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 large vessel disease vs. steal syndrome.  Resultant  Chronic right LE weakness and mild foot drop  CT head - left inferior cerebellum acute infarction.  MRI head - left PICA territory infarct, chronic left MCA infarct  MRA  head - left VA occlusion, left PICA not visualized  Carotid Doppler - Severely abnormal, dampened, preocclusive flow with a possible short segment of occlusive disease at the right distal CCA with retrograde flow in Right ECA.   Velocities in the left ICA are consistent with a 80-99% stenosis. A patent left subclavian artery to common carotid artery.    Left vertebral artery not be able to visualized.  2D Echo - EF 65% to 70%. No cardiac source of emboli identified.  CTA head and neck pending  LDL - 45  HgbA1c - 6.2  VTE prophylaxis - IV heparin   aspirin 81 mg daily and clopidogrel 75 mg daily prior to admission, now on aspirin 81 mg daily and Eliquis (apixaban) daily. Off IV heparin.   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  Pt current left PICA infarct could be due to afib, or left VA occlusion, or due to subclavian steal, pending further CTA head and neck or cerebral angiogram  PAF, new diagnosis  Tele showed afib in ER  Was on heparin drip  Now transition to eliquis  Rate controlled  CAD s/p CABG  2005  Following with cardiology, no afib found  Until this admission  Was on ASA and plavix  plavix stopped after  starting eliquis  Continue ASA   Hypertension  Stable . Permissive hypertension (OK if < 220/120) but gradually normalize in 5-7 days . Long-term BP goal 130-150 due to large vessel stenosis/occlusion  Hyperlipidemia  Lipid lowering medication PTA: Lipitor 80 mg daily  LDL 45, goal < 70  Current lipid lowering medication: Lipitor 80 mg daily  Continue statin at discharge  Other Stroke Risk Factors  Advanced age  Former cigarette smoker - quit  ETOH use, advised to drink no more than 1 alcoholic beverage per day.  Obesity, Body mass index is 30.23 kg/m., recommend weight loss, diet and exercise as appropriate   Other Active Problems  Seizure disorder on [facility] day # 2  the patient has presented with left PICA infarct etiology indeterminant whether related to new onset A. Fib or subclavian steal syndrome. Had a long discussion the patient with regards to her IV dye allergy and the need to obtain more definitive information about her extracranial vasculature to decide if she will benefit with endovascular treatment and she is agreeable. I'll discuss the case with Dr. Corliss Skains and he will write for preparation for dialysis he prior to scheduling cerebral catheter angiogram for Monday. We plan to hold eliquis ~angiogram is completed and continue aspirin alone instead D/W  Dr. Renford Dills.Greater than 50% time during this 35 minute visit was spent on counseling and coordination of care about her stroke, discussion about significant occlusive extracranial vascular disease, IV dye allergies and answered questions Diamond Heady, MD Medical Director [facility] Pager: [phone] 10/15/2017 12:05 PM To contact Stroke Continuity provider, please refer to [email or web address]. After hours, contact General Neurology

## 2017-10-15 NOTE — Progress Notes (Signed)
Patient's family states her facial droop is worse than yesterday. After additional assessment I told them it was the same as this morning and patient appears stable. Neurology paged and rounded with family to discuss POC and address concerns. No new orders at this time.

## 2017-10-15 NOTE — Progress Notes (Signed)
1 L of oxygen applied to patient, current oxygen saturations at 93%

## 2017-10-15 NOTE — Progress Notes (Signed)
Patient current bladder scan reveals 370cc. Patient states she is unable to urinate. Patient got two in and out catheters documented under I&O last night. Attending MD paged and asked if a foley or another in and out. In and out advised at this time.

## 2017-10-15 NOTE — Progress Notes (Signed)
PROGRESS NOTE    Diamond [name]  KVQ:[id number] DOB: September 06, 1940 DOA: 10/13/2017 PCP: System, Pcp Not In   Brief Narrative: Patient is a 77 year old female with past medical history of hyperlipidemia, hypertension, seizures, strokes, coronary artery disease, status post CABG who was sent from [facility] after she presented there with complaints of dizziness.  It was associated with nausea and vomiting.  She was noted to be hypertensive on presentation.  MRI/MRI brain obtained at [facility] showed occlusion of the left  vertebral artery and acute left posterior inferior cerebellar infarct.  Also noted to be in new onset A. fib with RVR. She has been transferred here. Neurology has been consulted.  Patient was started on anticoagulation with heparin drip for A. fib with RVR which has been changed to eliquis now.  Assessment & Plan:   Principal Problem:   Acute ischemic stroke [facility]) Active Problems:   Coronary artery disease involving native coronary artery of native heart without angina pectoris   Dyslipidemia   Essential hypertension   History of stroke   Hx of CABG   Hyperlipidemia   Vertebral artery disease (HCC)  Acute ischemic stroke: MRA/MRI brain obtained at [facility] showed occlusion of the left  vertebral artery and acute left posterior inferior cerebellar infarct.  Neurology is following.  Echocardiogram showed EF of  65%-70%,no wall motion abnormalities.   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  Plan for carotid endarterectomy?Neurology following. She was on  Plavix and aspirin at home. Started Eliquis.Continue Aspirin. Continue statin.  Patient has history of 3 strokes in the past.  She has residual weakness on the right side.   PT/OT recommending CIR.   Hemoglobin A1c of 6.2.  Vertigo/ataxia: Secondary to stroke in the posterior circulation.The symptoms have significantly improved.  Continue  meclizine as needed.  New onset A. fib: Currently rate is controlled. She is on carvedilol at home which will continue .Her heart rate is fluctuating around 90-100.  She underwent echocardiogram.  She was started on anti-coagulation with heparin drip.  Anti-coagulation has been changed to Eliquis . Follow up with her own cardiologist on discharge.  History of coronary artery disease: Status post CABG in 2005.  Was on Plavix and aspirin at home. Plavix D/Ced.  She follows with cardiology Dr. Tomie China.  Continue statin.  HTN: Currently she is normotensive.She is on carvedilol and amlodipine at home.  Hyperlipidemia:Resume Lipitor 80 mg daily.  Fall/sacral pain: X-ray did not show any fracture dislocation.  Hypokalemia: Supplemented.  Headache: Complains of headache on the occipital region secondary to fall.  Continue pain medications as needed.   DVT prophylaxis: Eliquis Code Status: Full Family Communication: None present at the bedside Disposition Plan: CIR,awaiting final recommendation from neurology regarding carotid endarterectomy  Consultants: Neurology  Procedures: None  Antimicrobials: None  Subjective:  Patient seen and examined the bedside this morning.  Remains comfortable.  Denies any dizziness.  Complains of headache on the occiput. Objective: Vitals:   10/14/17 1927 10/14/17 2319 10/15/17 0346 10/15/17 0748  BP: 129/61 (!) 134/59 138/60 140/60  Pulse: (!) 165     Resp: 17     Temp: 98.5 F (36.9 C) 98 F (36.7 C) 98.2 F (36.8 C) 98.1 F (36.7 C)  TempSrc: Oral Oral  Oral  SpO2: 98%     Weight:      Height:        Intake/Output Summary (Last 24 hours) at  10/15/2017 0855 Last data filed at 10/15/2017 0200 Gross per 24 hour  Intake 390 ml  Output 900 ml  Net -510 ml   Filed Weights   10/13/17 1727  Weight: 72.6 kg (160 lb)    Examination:  General exam: Appears calm and comfortable ,Not in distress,average built HEENT:PERRL,Oral mucosa moist,  Ear/Nose normal on gross exam Respiratory system: Bilateral equal air entry, normal vesicular breath sounds, no wheezes or crackles  Cardiovascular system: Atrial fibrillation. No JVD, murmurs, rubs, gallops or clicks. No pedal edema. Gastrointestinal system: Abdomen is nondistended, soft and nontender. No organomegaly or masses felt. Normal bowel sounds heard. Central nervous system: Alert and oriented. Mild  right-sided residual weakness Extremities: No edema, no clubbing ,no cyanosis, distal peripheral pulses palpable. Skin: No rashes, lesions or ulcers,no icterus ,no pallor Psychiatry: Judgement and insight appear normal. Mood & affect appropriate.     Data Reviewed: I have personally reviewed following labs and imaging studies  CBC: Recent Labs  Lab 10/14/17 0230 10/15/17 0337  WBC 10.6* 8.6  HGB 11.5* 11.5*  HCT 35.1* 35.5*  MCV 90.7 91.0  PLT 289 265   Basic Metabolic Panel: Recent Labs  Lab 10/13/17 1629 10/14/17 0906 10/15/17 0337  NA 135  --  131*  K 3.4* 3.7 3.6  CL 97*  --  92*  CO2 27  --  31  GLUCOSE 95  --  118*  BUN 9  --  8  CREATININE 0.63  --  0.63  CALCIUM 8.8*  --  8.5*   GFR: Estimated Creatinine Clearance: 53.6 mL/min (by C-G formula based on SCr of 0.63 mg/dL). Liver Function Tests: Recent Labs  Lab 10/13/17 1629  AST 26  ALT 18  ALKPHOS 64  BILITOT 0.9  PROT 6.7  ALBUMIN 3.6   No results for input(s): LIPASE, AMYLASE in the last 168 hours. No results for input(s): AMMONIA in the last 168 hours. Coagulation Profile: Recent Labs  Lab 10/13/17 1629  INR 1.02   Cardiac Enzymes: No results for input(s): CKTOTAL, CKMB, CKMBINDEX, TROPONINI in the last 168 hours. BNP (last 3 results) No results for input(s): PROBNP in the last 8760 hours. HbA1C: Recent Labs    10/14/17 0230  HGBA1C 6.2*   CBG: No results for input(s): GLUCAP in the last 168 hours. Lipid Profile: Recent Labs    10/14/17 0230  CHOL 120  HDL 67  LDLCALC 45    TRIG 39  CHOLHDL 1.8   Thyroid Function Tests: No results for input(s): TSH, T4TOTAL, FREET4, T3FREE, THYROIDAB in the last 72 hours. Anemia Panel: No results for input(s): VITAMINB12, FOLATE, FERRITIN, TIBC, IRON, RETICCTPCT in the last 72 hours. Sepsis Labs: No results for input(s): PROCALCITON, LATICACIDVEN in the last 168 hours.  No results found for this or any previous visit (from the past 240 hour(s)).       Radiology Studies: Dg Sacrum/coccyx  Result Date: 10/13/2017 CLINICAL DATA:  Fall onto tailbone EXAM: SACRUM AND COCCYX - 2+ VIEW COMPARISON:  None. FINDINGS: There is no evidence of fracture or other focal bone lesions. IMPRESSION: Negative. Electronically Signed   By: Deatra Robinson M.D.   On: 10/13/2017 22:55   Ct Head Wo Contrast  Result Date: 10/14/2017 CLINICAL DATA:  77 y/o  F; follow-up of stroke. EXAM: CT HEAD WITHOUT CONTRAST TECHNIQUE: Contiguous axial images were obtained from the base of the skull through the vertex without intravenous contrast. COMPARISON:  10/13/2017 MRI of the head. FINDINGS: Brain: Stable chronic left perisylvian  infarct. Stable distribution of left inferior cerebellum acute infarction without associated mass effect or hemorrhage. No new stroke, hemorrhage, extra-axial collection, hydrocephalus, or herniation. Stable chronic microvascular ischemic changes and volume loss of the brain. Vascular: Calcific atherosclerosis of the carotid siphons and vertebral arteries. Skull: Normal. Negative for fracture or focal lesion. Sinuses/Orbits: No acute finding. Other: None. IMPRESSION: 1. Stable distribution of left inferior cerebellum acute infarction. No associated mass effect or hemorrhage. 2. No new acute intracranial abnormality identified. 3. Stable background of chronic microvascular ischemic changes, volume loss, and chronic infarcts of the brain. Electronically Signed   By: Mitzi HansenLance  Furusawa-Stratton M.D.   On: 10/14/2017 01:14        Scheduled  Meds: . apixaban  5 mg Oral BID  . aspirin EC  81 mg Oral Daily  . atorvastatin  80 mg Oral q1800  . carvedilol  3.125 mg Oral BID  . famotidine  20 mg Oral Daily   Continuous Infusions: . lactated ringers 75 mL/hr at 10/15/17 0759     LOS: 2 days    Time spent: 25  mins.More than 50% of that time was spent in counseling and/or coordination of care.      Burnadette PopAmrit Hayes Czaja, MD [facility] Pager [phone]  If 7PM-7AM, please contact night-coverage [email or web address] Password [email or web address] 10/15/2017, 8:55 AM

## 2017-10-15 NOTE — Progress Notes (Addendum)
Oxygen removed from patient per MD request. Patient currently on room air with oxygen sats at 86-91%. Attending MD paged and made aware.

## 2017-10-15 NOTE — Progress Notes (Signed)
Patient ID: Diamond [name], female   DOB: [birth year], 77 y.o.   MRN: [id number]  Dr Corliss Skainseveshwar and IR aware of procedure scheduled for Mon IR PA to see pt Sunday Will place orders for procedure and pre medication

## 2017-10-15 NOTE — Plan of Care (Signed)
Daughter at bedside is assisting with educational needs.   Problem: Education: Goal: Knowledge of General Education information will improve Description Including pain rating scale, medication(s)/side effects and non-pharmacologic comfort measures Outcome: Progressing   Problem: Clinical Measurements: Goal: Ability to maintain clinical measurements within normal limits will improve Outcome: Progressing   Problem: Nutrition: Goal: Adequate nutrition will be maintained Outcome: Progressing   Problem: Safety: Goal: Ability to remain free from injury will improve Outcome: Progressing   Problem: Skin Integrity: Goal: Risk for impaired skin integrity will decrease Outcome: Progressing

## 2017-10-16 DIAGNOSIS — G463 Brain stem stroke syndrome: Secondary | ICD-10-CM

## 2017-10-16 DIAGNOSIS — I639 Cerebral infarction, unspecified: Secondary | ICD-10-CM

## 2017-10-16 DIAGNOSIS — I10 Essential (primary) hypertension: Secondary | ICD-10-CM

## 2017-10-16 LAB — CBC
HEMATOCRIT: 32.2 % — AB (ref 36.0–46.0)
HEMOGLOBIN: 10.4 g/dL — AB (ref 12.0–15.0)
MCH: 29.2 pg (ref 26.0–34.0)
MCHC: 32.3 g/dL (ref 30.0–36.0)
MCV: 90.4 fL (ref 78.0–100.0)
Platelets: 248 10*3/uL (ref 150–400)
RBC: 3.56 MIL/uL — AB (ref 3.87–5.11)
RDW: 13.4 % (ref 11.5–15.5)
WBC: 9.3 10*3/uL (ref 4.0–10.5)

## 2017-10-16 MED ORDER — FUROSEMIDE 10 MG/ML IJ SOLN
40.0000 mg | Freq: Once | INTRAMUSCULAR | Status: AC
  Administered 2017-10-16: 40 mg via INTRAVENOUS
  Filled 2017-10-16: qty 4

## 2017-10-16 MED ORDER — HEPARIN SODIUM (PORCINE) 5000 UNIT/ML IJ SOLN
5000.0000 [IU] | Freq: Three times a day (TID) | INTRAMUSCULAR | Status: AC
  Administered 2017-10-16 (×2): 5000 [IU] via SUBCUTANEOUS
  Filled 2017-10-16 (×2): qty 1

## 2017-10-16 MED ORDER — DIPHENHYDRAMINE HCL 25 MG PO CAPS
50.0000 mg | ORAL_CAPSULE | Freq: Once | ORAL | Status: AC
  Administered 2017-10-17: 50 mg via ORAL
  Filled 2017-10-16: qty 2

## 2017-10-16 MED ORDER — PREDNISONE 50 MG PO TABS
50.0000 mg | ORAL_TABLET | Freq: Four times a day (QID) | ORAL | Status: AC
  Administered 2017-10-16 – 2017-10-17 (×3): 50 mg via ORAL
  Filled 2017-10-16 (×3): qty 1

## 2017-10-16 NOTE — Consult Note (Signed)
Physical Medicine and Rehabilitation Consult Reason for Consult: Rehabilitation evaluation post stroke Referring Phsyician: Diamond [name] is an 77 y.o. female.   HPI: Elderly female with past history of coronary artery disease, prior CVA, hypertension, hyperlipidemia as well as subclavian steal syndrome status post bypass was admitted to [facility] 10/13/2017 with complaints of dizziness.  Blood pressure was elevated to systolic of 190.  Patient complained nausea and had some vomiting.  Patient was taken to [facility] where an MRI demonstrated an acute left posterior inferior cerebellar infarct.  Patient was in atrial fibrillation with rapid ventricular response.  Patient was transferred to [facility] for acute stroke.  Patient was admitted by the hospitalist service and a neurology consultation was obtained.  A residual right-sided weakness from prior stroke was noted.  Additional stroke work-up was recommended.  Aspirin and Plavix were prescribed, atorvastatin was ordered.  CTA was recommended and vascular interventional radiology evaluated the patient on 10/16/2017  Review of Systems -positive for headache, dizziness, visual change, weakness in the right upper extremity, decreased coordination left upper extremity, denies chest pains shortness of breath positive for nausea and vomiting, negative for abdominal pain, positive for Foley, no pains in the upper or lower limbs, feels tired  Past Medical History:  Diagnosis Date  . Arthritis    "arms, hands, neck, shoulders; some in my knees" (10/13/2017)  . CAD (coronary artery disease)   . CVA (cerebral vascular accident) (HCC) 1995, 2001   /notes 08/11/2010; "weakness in right arm as a result" (10/13/2017)  . History of blood transfusion    "related to OHS"  . History of kidney stones   . Hyperlipidemia   . Hypertension   . New onset a-fib (HCC) 10/13/2017  . Pneumonia    "when I was little"  . Subclavian steal syndrome 2004    LUE/notes 10/13/2017  . TIA (transient ischemic attack) 2003   Diamond [name] 08/11/2010  . Vertigo 10/13/2017   ataxia/notes 10/13/2017   Past Surgical History:  Procedure Laterality Date  . ABDOMINAL HYSTERECTOMY    . CARDIAC CATHETERIZATION  04/2003   Diamond [name] 08/11/2010  . CAROTID ARTERY - SUBCLAVIAN ARTERY BYPASS GRAFT Left 10/2002   carotid subclavian bypass with a 6 mm Dacron graft/notes 08/11/2010   . CARPAL TUNNEL RELEASE Right   . CHOLECYSTECTOMY OPEN    . CORONARY ANGIOPLASTY  04/2002   Diamond [name] 08/11/2010  . CORONARY ARTERY BYPASS GRAFT  05/2003   CABG X4/notes 08/11/2010  . CYSTOSCOPY W/ STONE MANIPULATION    . DILATION AND CURETTAGE OF UTERUS    . HERNIA REPAIR    . MEDIASTINAL EXPLORATION  05/2003   Diamond [name] 08/11/2010  . UMBILICAL HERNIA REPAIR     Family History  Problem Relation Age of Onset  . Heart disease Father    Social History:  reports that she quit smoking about 25 years ago. Her smoking use included cigarettes. She has a 12.50 pack-year smoking history. She has never used smokeless tobacco. She reports that she drinks alcohol. She reports that she does not use drugs. Allergies:  Allergies  Allergen Reactions  . Codeine Other (See Comments)   Medications Prior to Admission  Medication Sig Dispense Refill  . acetaminophen (TYLENOL) 500 MG tablet Take 1,000 mg by mouth as needed for mild pain or headache.    . alendronate (FOSAMAX) 70 MG tablet Take 70 mg by mouth once a week.  4  . ALPRAZolam (XANAX) 0.5 MG tablet Take 0.5 mg by mouth every 6 (  six) hours as needed for anxiety.  0  . amLODipine (NORVASC) 5 MG tablet Take 5 mg by mouth daily.  4  . aspirin EC 81 MG tablet Take 81 mg by mouth daily.    Marland Kitchen atorvastatin (LIPITOR) 80 MG tablet Take 80 mg by mouth daily.  3  . carvedilol (COREG) 6.25 MG tablet Take 6.25 mg by mouth 2 (two) times daily.  4  . clopidogrel (PLAVIX) 75 MG tablet Take 75 mg by mouth daily.  3  . hydroxypropyl methylcellulose / hypromellose (ISOPTO  TEARS / GONIOVISC) 2.5 % ophthalmic solution Place 1 drop into both eyes as needed for dry eyes.    . ranitidine (ZANTAC) 150 MG tablet Take 150 mg by mouth 2 (two) times daily.  3    Home: Home Living Family/patient expects to be discharged to:: Private residence Living Arrangements: Alone Available Help at Discharge: Available PRN/intermittently(son, daughter, and other family can assist some but not 24 hr) Type of Home: House Home Access: Level entry Home Layout: One level Bathroom Shower/Tub: Engineer, manufacturing systems: Standard Home Equipment: None Additional Comments: "I need grab bars"  Functional History: Prior Function Comments: has a dog that she cares for Functional Status:  Mobility:     Ambulation/Gait General Gait Details: deferred per patient preference    ADL: ADL Grooming Details (indicate cue type and reason): simulated  Cognition: Cognition Overall Cognitive Status: Within Functional Limits for tasks assessed Arousal/Alertness: Awake/alert Orientation Level: Oriented X4 Attention: (impaired by dizziness) Cognition Arousal/Alertness: Awake/alert Behavior During Therapy: Anxious Overall Cognitive Status: Within Functional Limits for tasks assessed General Comments: Pt anxious secondary to dizziness and feeling sick, ready to get back in the bed.  Needed mod re-direction at times to relax and listen to therapist.  Will continue to further assess cognition during function.  Blood pressure (!) 144/61, pulse 97, temperature 98.5 F (36.9 C), temperature source Oral, resp. rate 12, height 5\' 1"  (1.549 m), weight 72.6 kg (160 lb), SpO2 93 %. Physical Exam  General: No acute distress Mood and affect are appropriate Heart: Regular rate and rhythm no rubs murmurs or extra sounds Lungs: Clear to auscultation, breathing unlabored, no rales or wheezes Abdomen: Positive bowel sounds, soft nontender to palpation, nondistended Extremities: No clubbing,  cyanosis, or edema Skin: No evidence of breakdown, no evidence of rash Neurologic: Cranial nerves II through XII intact, motor strength is 5/5 in left deltoid, bicep, tricep, grip, hip flexor, knee extensors, ankle dorsiflexor and plantar flexor, 4/5 in the right deltoid bicep tricep grip hip flexor knee extensor ankle dorsiflexor and plantar flexor Sensory exam normal sensation to light touch and proprioception in bilateral upper and lower extremities Cerebellar exam normal finger to nose to finger in the right upper extremity but mild dysmetria in the left upper extremity Visual fields intact to confrontation testing however patient gets dizzy during this testing procedure Musculoskeletal: Full range of motion in all 4 extremities. No joint swelling Results for orders placed or performed during the hospital encounter of 10/13/17 (from the past 24 hour(s))  Glucose, capillary     Status: Abnormal   Collection Time: 10/15/17  4:30 PM  Result Value Ref Range   Glucose-Capillary 104 (H) 70 - 99 mg/dL   Comment 1 Notify RN    Comment 2 Document in Chart   Urinalysis, Routine w reflex microscopic     Status: Abnormal   Collection Time: 10/15/17  6:00 PM  Result Value Ref Range   Color, Urine YELLOW YELLOW  APPearance HAZY (A) CLEAR   Specific Gravity, Urine 1.014 1.005 - 1.030   pH 6.0 5.0 - 8.0   Glucose, UA NEGATIVE NEGATIVE mg/dL   Hgb urine dipstick NEGATIVE NEGATIVE   Bilirubin Urine NEGATIVE NEGATIVE   Ketones, ur NEGATIVE NEGATIVE mg/dL   Protein, ur NEGATIVE NEGATIVE mg/dL   Nitrite NEGATIVE NEGATIVE   Leukocytes, UA NEGATIVE NEGATIVE  CBC     Status: Abnormal   Collection Time: 10/16/17  2:45 AM  Result Value Ref Range   WBC 9.3 4.0 - 10.5 K/uL   RBC 3.56 (L) 3.87 - 5.11 MIL/uL   Hemoglobin 10.4 (L) 12.0 - 15.0 g/dL   HCT 40.932.2 (L) 81.136.0 - 91.446.0 %   MCV 90.4 78.0 - 100.0 fL   MCH 29.2 26.0 - 34.0 pg   MCHC 32.3 30.0 - 36.0 g/dL   RDW 78.213.4 95.611.5 - 21.315.5 %   Platelets 248 150 -  400 K/uL   Dg Chest 2 View  Result Date: 10/15/2017 CLINICAL DATA:  Encounter for oxygen desaturation EXAM: CHEST - 2 VIEW COMPARISON:  10/13/2017 FINDINGS: Mild interstitial edema or infiltrates, more conspicuous than on prior exam, involving bases more than apices. Heart size upper limits normal. Previous median sternotomy and CABG. Aortic Atherosclerosis (ICD10-170.0). Small left pleural effusion. Old right fourth rib fracture deformity. IMPRESSION: 1. Mild interstitial edema/infiltrates with cardiomegaly and small effusion suggesting CHF. Electronically Signed   By: Corlis Leak  Hassell M.D.   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 Does the need for close, 24 hr/day medical supervision in concert with the patient's rehab needs make it unreasonable for this patient to be served in a less intensive setting? Yes 2. Co-Morbidities requiring supervision/potential complications: Coronary artery disease, urinary retention, anemia, hyperlipidemia, peripheral vascular disease 3. Due to bladder management, bowel management, safety, skin/wound care, disease management, medication administration, pain management and patient education, does the patient require 24 hr/day rehab nursing? Yes 4. Does the patient require coordinated care of a physician, rehab nurse, PT (1-2 hrs/day, 5 days/week) and OT (1-2 hrs/day, 5 days/week) to address physical and functional deficits in the context of the above medical diagnosis(es)? Yes Addressing deficits in the following areas: balance, endurance, locomotion, strength, transferring, bowel/bladder control, bathing, dressing, feeding, grooming, toileting, cognition and psychosocial support 5. Can the patient actively participate in an intensive therapy program of at least 3 hrs of therapy per day at least 5 days per week?  Yes 6. The potential for patient to make measurable gains while on inpatient rehab is good 7. Anticipated functional outcomes upon discharge from inpatients are sup PT, supervision OT, not applicable SLP 8. Estimated rehab length of stay to reach the above functional goals is: 12 to 16 days 9. Does the patient have adequate social supports to accommodate these discharge functional goals? Yes 10. Anticipated D/C setting: Home 11. Anticipated post D/C treatments: HH therapy 12. Overall Rehab/Functional Prognosis: good  RECOMMENDATIONS: This patient's condition is appropriate for continued rehabilitative care in the following setting: CIR Patient has agreed to participate in recommended program. Yes Note that insurance prior authorization may be required for reimbursement for recommended care.  Comment: We will need to complete stroke work-up prior to inpatient rehabilitation   Erick Colacendrew E Kirsteins 10/16/2017

## 2017-10-16 NOTE — Progress Notes (Signed)
PROGRESS NOTE    Diamond [name]  ZOX:[id number] DOB: 1940/06/08 DOA: 10/13/2017 PCP: System, Pcp Not In   Brief Narrative: Patient is a 77 year old female with past medical history of hyperlipidemia, hypertension, seizures, strokes, coronary artery disease, status post CABG who was sent from [facility] after she presented there with complaints of dizziness.  It was associated with nausea and vomiting.  She was noted to be hypertensive on presentation.  MRI/MRI brain obtained at [facility] showed occlusion of the left  vertebral artery and acute left posterior inferior cerebellar infarct.  Also noted to be in new onset A. fib with RVR. She has been transferred here. Neurology has been consulted.  Patient was started on anticoagulation with heparin drip for A. fib with RVR which has been changed to eliquis now.Planned for cerebal angiogra  tomorrow.  Assessment & Plan:   Principal Problem:   Acute ischemic stroke [facility](HCC) Active Problems:   Coronary artery disease involving native coronary artery of native heart without angina pectoris   Dyslipidemia   Essential hypertension   History of stroke   Hx of CABG   Hyperlipidemia   Vertebral artery disease (HCC)  Acute ischemic stroke: MRA/MRI brain obtained at [facility] showed occlusion of the left  vertebral artery and acute left posterior inferior cerebellar infarct.  Neurology is following.  Echocardiogram showed EF of  65%-70%,no wall motion abnormalities.   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  Plan for cerebral angiogram tomorrow.Neurology following. She was on  Plavix and aspirin at home. Started Eliquis but has been held for tomorrow's angiogram.Continue Aspirin. Continue statin.  Patient has history of 3 strokes in the past.  She has residual weakness on the right side.   PT/OT recommending CIR.   Hemoglobin A1c of 6.2.  Vertigo/ataxia: Secondary to stroke  in the posterior circulation.The symptoms have significantly improved.  Continue meclizine as needed.  New onset A. fib: Currently rate is controlled. She is on carvedilol at home which will continue .Her heart rate is fluctuating around 90-100.  She underwent echocardiogram.  She was started on anti-coagulation with heparin drip.  Anti-coagulation has been changed to Eliquis . Follow up with her own cardiologist on discharge.  Respiratory insufficiency: Currently she is on supplemental oxygen.  She easily desaturates without oxygen.  Chest x-ray shows interstitial edema suggesting of CHF but echocardiogram shows normal ejection fraction, no diastolic abnormality.  Fluids have been discontinued.  We will give her a dose of Lasix today.  She has bibasilar crackles .  History of coronary artery disease: Status post CABG in 2005.  Was on Plavix and aspirin at home. Plavix D/Ced.  She follows with cardiology Dr. Tomie Chinaevankar.  Continue statin.  HTN: Currently she is normotensive.She is on carvedilol and amlodipine at home.  Hyperlipidemia:Resume Lipitor 80 mg daily.  Fall/sacral pain: X-ray did not show any fracture dislocation.  Hypokalemia: Supplemented.  Headache: Complains of headache on the occipital region secondary to fall.  Continue pain medications as needed.  Urinary Retention: Foley placed.  Will attempt to take out the Foley when appropriate.   DVT prophylaxis:Heparin [place] Code Status: Full Family Communication: None present at the bedside Disposition Plan: CIR, undergoing cerebral angiogram tomorrow  Consultants: Neurology  Procedures: None  Antimicrobials: None  Subjective:  Patient seen and examined the bedside this morning.  Remains comfortable.  Denies any dizziness.  Still Complains of headache on the occiput.  Denies any  shortness of breath. Objective: Vitals:   10/15/17 2324 10/16/17 0349 10/16/17 0419 10/16/17 0721  BP: 102/60 (!) 138/48 124/70 (!) 144/61  Pulse: 63  99 97   Resp: 15 19 12    Temp: 98.7 F (37.1 C)  98.5 F (36.9 C)   TempSrc: Oral  Oral   SpO2: 94% 94% 93%   Weight:      Height:        Intake/Output Summary (Last 24 hours) at 10/16/2017 0839 Last data filed at 10/16/2017 0419 Gross per 24 hour  Intake 825 ml  Output 1600 ml  Net -775 ml   Filed Weights   10/13/17 1727  Weight: 72.6 kg (160 lb)    Examination:  General exam: Appears calm and comfortable ,Not in distress,average built HEENT:PERRL,Oral mucosa moist, Ear/Nose normal on gross exam Respiratory system: Bibasilar fine crackles  cardiovascular system: Atrial fibrillation,rate is controlled. No JVD, murmurs, rubs, gallops or clicks. No pedal edema. Gastrointestinal system: Abdomen is nondistended, soft and nontender. No organomegaly or masses felt. Normal bowel sounds heard. Central nervous system: Alert and oriented. Mild  right-sided residual weakness Extremities: No edema, no clubbing ,no cyanosis, distal peripheral pulses palpable. Skin: No rashes, lesions or ulcers,no icterus ,no pallor Psychiatry: Judgement and insight appear normal. Mood & affect appropriate.     Data Reviewed: I have personally reviewed following labs and imaging studies  CBC: Recent Labs  Lab 10/14/17 0230 10/15/17 0337 10/16/17 0245  WBC 10.6* 8.6 9.3  HGB 11.5* 11.5* 10.4*  HCT 35.1* 35.5* 32.2*  MCV 90.7 91.0 90.4  PLT 289 265 248   Basic Metabolic Panel: Recent Labs  Lab 10/13/17 1629 10/14/17 0906 10/15/17 0337  NA 135  --  131*  K 3.4* 3.7 3.6  CL 97*  --  92*  CO2 27  --  31  GLUCOSE 95  --  118*  BUN 9  --  8  CREATININE 0.63  --  0.63  CALCIUM 8.8*  --  8.5*   GFR: Estimated Creatinine Clearance: 53.6 mL/min (by C-G formula based on SCr of 0.63 mg/dL). Liver Function Tests: Recent Labs  Lab 10/13/17 1629  AST 26  ALT 18  ALKPHOS 64  BILITOT 0.9  PROT 6.7  ALBUMIN 3.6   No results for input(s): LIPASE, AMYLASE in the last 168 hours. No results  for input(s): AMMONIA in the last 168 hours. Coagulation Profile: Recent Labs  Lab 10/13/17 1629  INR 1.02   Cardiac Enzymes: No results for input(s): CKTOTAL, CKMB, CKMBINDEX, TROPONINI in the last 168 hours. BNP (last 3 results) No results for input(s): PROBNP in the last 8760 hours. HbA1C: Recent Labs    10/14/17 0230  HGBA1C 6.2*   CBG: Recent Labs  Lab 10/15/17 1630  GLUCAP 104*   Lipid Profile: Recent Labs    10/14/17 0230  CHOL 120  HDL 67  LDLCALC 45  TRIG 39  CHOLHDL 1.8   Thyroid Function Tests: No results for input(s): TSH, T4TOTAL, FREET4, T3FREE, THYROIDAB in the last 72 hours. Anemia Panel: No results for input(s): VITAMINB12, FOLATE, FERRITIN, TIBC, IRON, RETICCTPCT in the last 72 hours. Sepsis Labs: No results for input(s): PROCALCITON, LATICACIDVEN in the last 168 hours.  No results found for this or any previous visit (from the past 240 hour(s)).       Radiology Studies: Dg Chest 2 View  Result Date: 10/15/2017 CLINICAL DATA:  Encounter for oxygen desaturation EXAM: CHEST - 2 VIEW COMPARISON:  10/13/2017 FINDINGS: Mild interstitial  edema or infiltrates, more conspicuous than on prior exam, involving bases more than apices. Heart size upper limits normal. Previous median sternotomy and CABG. Aortic Atherosclerosis (ICD10-170.0). Small left pleural effusion. Old right fourth rib fracture deformity. IMPRESSION: 1. Mild interstitial edema/infiltrates with cardiomegaly and small effusion suggesting CHF. Electronically Signed   By: Corlis Leak M.D.   On: 10/15/2017 14:40        Scheduled Meds: . aspirin EC  81 mg Oral Daily  . atorvastatin  80 mg Oral q1800  . carvedilol  3.125 mg Oral BID  . famotidine  20 mg Oral Daily  . furosemide  40 mg Intravenous Once   Continuous Infusions:    LOS: 3 days    Time spent: 25  mins.More than 50% of that time was spent in counseling and/or coordination of care.      Burnadette Pop, MD [facility] Pager [phone]  If 7PM-7AM, please contact night-coverage [email or web address] Password [facility] 10/16/2017, 8:39 AM

## 2017-10-16 NOTE — Progress Notes (Signed)
STROKE TEAM PROGRESS NOTE   SUBJECTIVE (INTERVAL HISTORY) Her family is  at the bedside.Diamond [name] has met the radiology PA and   Diamond [name] is willing for prep prior to procedure for her dye allergy    OBJECTIVE Temp:  [98.5 F (36.9 C)-98.9 F (37.2 C)] 98.5 F (36.9 C) (07/21 0419) Pulse Rate:  [63-99] 97 (07/21 0419) Cardiac Rhythm: Atrial fibrillation (07/21 0700) Resp:  [12-19] 12 (07/21 0419) BP: (102-144)/(37-70) 144/61 (07/21 0721) SpO2:  [93 %-94 %] 93 % (07/21 0419)  CBC:  Recent Labs  Lab 10/15/17 0337 10/16/17 0245  WBC 8.6 9.3  HGB 11.5* 10.4*  HCT 35.5* 32.2*  MCV 91.0 90.4  PLT 265 517    Basic Metabolic Panel:  Recent Labs  Lab 10/13/17 1629 10/14/17 0906 10/15/17 0337  NA 135  --  131*  K 3.4* 3.7 3.6  CL 97*  --  92*  CO2 27  --  31  GLUCOSE 95  --  118*  BUN 9  --  8  CREATININE 0.63  --  0.63  CALCIUM 8.8*  --  8.5*    Lipid Panel:     Component Value Date/Time   CHOL 120 10/14/2017 0230   TRIG 39 10/14/2017 0230   HDL 67 10/14/2017 0230   CHOLHDL 1.8 10/14/2017 0230   VLDL 8 10/14/2017 0230   LDLCALC 45 10/14/2017 0230   HgbA1c:  Lab Results  Component Value Date   HGBA1C 6.2 (H) 10/14/2017   Urine Drug Screen:     Component Value Date/Time   LABOPIA NONE DETECTED 10/13/2017 1749   COCAINSCRNUR NONE DETECTED 10/13/2017 1749   LABBENZ NONE DETECTED 10/13/2017 1749   AMPHETMU NONE DETECTED 10/13/2017 1749   THCU NONE DETECTED 10/13/2017 1749   LABBARB (A) 10/13/2017 1749    Result not available. Reagent lot number recalled by manufacturer.    Alcohol Level No results found for: ETH  IMAGING  Ct Head Wo Contrast 10/14/2017 IMPRESSION:  1. Stable distribution of left inferior cerebellum acute infarction. No associated mass effect or hemorrhage. 2. No new acute intracranial abnormality identified.  3. Stable background of chronic microvascular ischemic changes, volume loss, and chronic infarcts of the brain.   Transthoracic  Echocardiogram  10/14/2017 Study Conclusions - Left ventricle: The cavity size was normal. There was moderate   concentric hypertrophy. Systolic function was vigorous. The   estimated ejection fraction was in the range of 65% to 70%. Wall   motion was normal; there were no regional wall motion   abnormalities. - Aortic valve: There was mild regurgitation. Valve area (VTI): 2.6   cm^2. Valve area (Vmax): 2.02 cm^2. Valve area (Vmean): 2.47   cm^2. - Mitral valve: There was mild regurgitation. - Left atrium: The atrium was mildly dilated. - Right atrium: The atrium was mildly dilated. - Tricuspid valve: There was moderate regurgitation. - Pulmonary arteries: Systolic pressure was mildly increased. PA   peak pressure: 37 mm Hg (S). Impressions: - Hyperdynamic LVEF. Thickened aortic valve leaflets and   significant calcifications in the coronary sinuses, no aortic   stenosis, mild regurgitation. Mild mitral and moderate tricuspid   regurgitation. PM in place. Mildly dilated ascending aorta   measuring 41 mm.  Bilateral Carotid Dopplers  Severely abnormal, dampened, preocclusive flow with a possible short segment of occlusive disease at the right distal CCA with retrograde flow in Right ECA.   Velocities in the left ICA are consistent with a 80-99% stenosis. A patent left subclavian artery to  common carotid artery.    Left vertebral artery not be able to visualized.   Cerebral angio pending   PHYSICAL EXAM  Temp:  [98.5 F (36.9 C)-98.9 F (37.2 C)] 98.5 F (36.9 C) (07/21 0419) Pulse Rate:  [63-99] 97 (07/21 0419) Resp:  [12-19] 12 (07/21 0419) BP: (102-144)/(37-70) 144/61 (07/21 0721) SpO2:  [93 %-94 %] 93 % (07/21 0419)  General - Well nourished, well developed,elderly caucasain lady.  Ophthalmologic - fundi not visualized due to noncooperation.  Cardiovascular - irregularly irregular heart rate and rhythm.  Mental Status -  Level of arousal and orientation to time,  place, and person were intact. Language including expression, naming, repetition, comprehension was assessed and found intact.  Cranial Nerves II - XII - II - Visual field intact OU. III, IV, VI - Extraocular movements intact. V - Facial sensation intact bilaterally. VII - Facial movement intact bilaterally. VIII - Hearing & vestibular intact bilaterally, no nystagmus. X - Palate elevates symmetrically. XI - Chin turning & shoulder shrug intact bilaterally. XII - Tongue protrusion intact.  Motor Strength - The patient's strength was normal in all extremities except left lower extremity 4+/5 and left ankle dorsiflexion 4/5 and pronator drift was absent.  Bulk was normal and fasciculations were absent.   Motor Tone - Muscle tone was assessed at the neck and appendages and was normal.  Reflexes - The patient's reflexes were symmetrical in all extremities and Diamond [name] had no pathological reflexes.  Sensory - Light touch, temperature/pinprick were assessed and were symmetrical.    Coordination - The patient had normal movements in the hands and left foot with no ataxia or dysmetria, not able to perform on right foot due to weakness.  Tremor was absent.  Gait and Station - deferred.   ASSESSMENT/PLAN Diamond [name] is a 77 y.o. female with history of a previous stroke, hypertension, hyperlipidemia, coronary artery disease, subclavian steal syndrome, and new onset atrial fibrillation presenting with vertigo, diaphoresis, nausea and vomiting. Diamond [name] did not receive IV t-PA due to late presentation.  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 large vessel disease vs. steal syndrome.  Resultant  Chronic right LE weakness and mild foot drop  CT head - left inferior cerebellum acute infarction.  MRI head - left PICA territory infarct, chronic left MCA infarct  MRA head - left VA occlusion, left PICA not visualized  Carotid Doppler  - Severely abnormal, dampened, preocclusive flow with a possible short segment of occlusive disease at the right distal CCA with retrograde flow in Right ECA.   Velocities in the left ICA are consistent with a 80-99% stenosis. A patent left subclavian artery to common carotid artery.    Left vertebral artery not be able to visualized.  2D Echo - EF 65% to 70%. No cardiac source of emboli identified.  CTA head and neck pending  LDL - 45  HgbA1c - 6.2  VTE prophylaxis - IV heparin   aspirin 81 mg daily and clopidogrel 75 mg daily prior to admission, now on aspirin 81 mg daily and Eliquis (apixaban) daily. Off IV heparin.   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  Pt current left PICA infarct could be due to afib, or left VA occlusion, or due to subclavian steal, pending further CTA head and neck or cerebral angiogram  PAF, new diagnosis  Tele showed afib in ER  Was on heparin drip  Now transition to eliquis  Rate controlled  CAD s/p CABG  2005  Following with cardiology, no afib found  Until this admission  Was on ASA and plavix  plavix stopped after starting eliquis  Continue ASA   Hypertension  Stable . Permissive  hypertension (OK if < 220/120) but gradually normalize in 5-7 days . Long-term BP goal 130-150 due to large vessel stenosis/occlusion  Hyperlipidemia  Lipid lowering medication PTA: Lipitor 80 mg daily  LDL 45, goal < 70  Current lipid lowering medication: Lipitor 80 mg daily  Continue statin at discharge  Other Stroke Risk Factors  Advanced age  Former cigarette smoker - quit  ETOH use, advised to drink no more than 1 alcoholic beverage per day.  Obesity, Body mass index is 30.23 kg/m., recommend weight loss, diet and exercise as appropriate   Other Active Problems  Seizure disorder on [facility] day # 3  the patient has presented with left PICA infarct etiology indeterminant whether related to new onset A. Fib or subclavian steal syndrome. Had a long discussion the patient with regards to her IV dye allergy and the need to obtain more definitive information about her extracranial vasculature to decide if Diamond [name] will benefit with endovascular treatment and Diamond [name] is agreeable.   We plan to hold eliquis till angiogram is completed and continue aspirin alone instead D/W  Dr. Tawanna Solo.and family at [place] than 50% time during this 25 minute visit was spent on counseling and coordination of care about her stroke, discussion about significant occlusive extracranial vascular disease, IV dye allergies and answered questions Antony Contras, MD Medical Director [place] Pager: [phone] 10/16/2017 12:21 PM To contact Stroke Continuity provider, please refer to [email or web address] After hours, contact General Neurology

## 2017-10-16 NOTE — Consult Note (Signed)
Chief Complaint: Patient was seen in consultation today for cerebral arteriogram at the request of Dr Lina Sayre  Supervising Physician: Julieanne Cotton  Patient Status: [facility] [place] - In-pt  History of Present Illness: Diamond [name] is a 77 y.o. female   HTN; HLD; Sz; CVAs; CAD/CABG Complaints of dizziness; vertigo N/V Hypertension New onset Afib  Dr Pearlean Brownie note:  Stroke:  Left PICA territory acute infarction - embolic - secondary to new diagnosed atrial fibrillation vs. large vessel disease vs. steal syndrome.  Resultant  Chronic right LE weakness and mild foot drop  CT head - left inferior cerebellum acute infarction.  MRI head - left PICA territory infarct, chronic left MCA infarct  MRA head - left VA occlusion, left PICA not visualized  Carotid Doppler - Severely abnormal, dampened, preocclusive flow with a possible short segment of occlusive disease at therightdistal CCA with retrograde flow in Right ECA. Velocities in the left ICA are consistent with a 80-99% stenosis. A patent left subclavian artery to common carotid artery. Left vertebral artery not be able to visualized  aspirin 81 mg daily and clopidogrel 75 mg daily prior to admission, now on aspirin 81 mg daily and Eliquis (apixaban) daily. Off IV heparin.  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  Pt current left PICA infarct could be due to  afib, or left VA occlusion, or due to subclavian steal, pending further CTA head and neck or cerebral angiogram PAF, new diagnosis  CT 7/19: IMPRESSION: 1. Stable distribution of left inferior cerebellum acute infarction. No associated mass effect or hemorrhage. 2. No new acute intracranial abnormality identified. 3. Stable background of chronic microvascular ischemic changes, volume loss, and chronic infarcts of the brain.  Request for cerebral arteriogram Imaging reviewed by Dr Corliss Skains and approves procedure Pt is allergic to dye Will pre medicate for 7/22  Past Medical History:  Diagnosis Date  . Arthritis    "arms, hands, neck, shoulders; some in my knees" (10/13/2017)  . CAD (coronary artery disease)   . CVA (cerebral vascular accident) (HCC) 1995, 2001   /notes 08/11/2010; "weakness in right arm as a result" (10/13/2017)  . History of blood transfusion    "related to OHS"  . History of kidney stones   . Hyperlipidemia   . Hypertension   . New onset a-fib (HCC) 10/13/2017  . Pneumonia    "when I was little"  . Subclavian steal syndrome 2004   LUE/notes 10/13/2017  . TIA (transient ischemic attack) 2003   [name] 08/11/2010  . Vertigo 10/13/2017   ataxia/notes 10/13/2017    Past Surgical History:  Procedure Laterality Date  . ABDOMINAL HYSTERECTOMY    . CARDIAC CATHETERIZATION  04/2003   [name] 08/11/2010  . CAROTID ARTERY - SUBCLAVIAN ARTERY BYPASS GRAFT Left 10/2002   carotid subclavian bypass with a 6 mm Dacron graft/notes 08/11/2010   . CARPAL TUNNEL RELEASE Right   . CHOLECYSTECTOMY OPEN    .  CORONARY ANGIOPLASTY  04/2002   [name] 08/11/2010  . CORONARY ARTERY BYPASS GRAFT  05/2003   CABG X4/notes 08/11/2010  . CYSTOSCOPY W/ STONE MANIPULATION    . DILATION AND CURETTAGE OF UTERUS    . HERNIA REPAIR    . MEDIASTINAL EXPLORATION  05/2003   [name] 08/11/2010  . UMBILICAL HERNIA REPAIR      Allergies: Codeine  Medications: Prior to Admission medications     Medication Sig Start Date End Date Taking? Authorizing Provider  acetaminophen (TYLENOL) 500 MG tablet Take 1,000 mg by mouth as needed for mild pain or headache.   Yes [provider]  alendronate (FOSAMAX) 70 MG tablet Take 70 mg by mouth once a week. 08/11/17  Yes [provider]  ALPRAZolam Prudy Feeler) 0.5 MG tablet Take 0.5 mg by mouth every 6 (six) hours as needed for anxiety. 07/20/17  Yes [provider]  amLODipine (NORVASC) 5 MG tablet Take 5 mg by mouth daily. 08/11/17  Yes [provider]  aspirin EC 81 MG tablet Take 81 mg by mouth daily.   Yes [provider]  atorvastatin (LIPITOR) 80 MG tablet Take 80 mg by mouth daily. 09/29/17  Yes [provider]  carvedilol (COREG) 6.25 MG tablet Take 6.25 mg by mouth 2 (two) times daily. 09/15/17  Yes [provider]  clopidogrel (PLAVIX) 75 MG tablet Take 75 mg by mouth daily. 07/15/17  Yes [provider]  hydroxypropyl methylcellulose / hypromellose (ISOPTO TEARS / GONIOVISC) 2.5 % ophthalmic solution Place 1 drop into both eyes as needed for dry eyes.   Yes [provider]  ranitidine (ZANTAC) 150 MG tablet Take 150 mg by mouth 2 (two) times daily. 07/25/17  Yes [provider]     Family History  Problem Relation Age of Onset  . Heart disease Father     Social History   Socioeconomic History  . Marital status: Widowed    Spouse name: Not on file  . Number of children: Not on file  . Years of education: Not on file  . Highest education level: Not on file  Occupational History  . Not on file  Social Needs  . Financial resource strain: Not on file  . Food insecurity:    Worry: Not on file    Inability: Not on file  . Transportation needs:    Medical: Not on file    Non-medical: Not on file  Tobacco Use  . Smoking status: Former Smoker    Packs/day: 0.50    Years: 25.00    Pack years: 12.50    Types: Cigarettes    Last attempt to quit: 1994     Years since quitting: 25.5  . Smokeless tobacco: Never Used  Substance and Sexual Activity  . Alcohol use: Yes    Frequency: Never    Comment: 10/13/2017 "glass of wine at Christmas"  . Drug use: Never  . Sexual activity: Yes  Lifestyle  . Physical activity:    Days per week: Not on file    Minutes per session: Not on file  . Stress: Not on file  Relationships  . Social connections:    Talks on phone: Not on file    Gets together: Not on file    Attends religious service: Not on file    Active member of club or organization: Not on file    Attends meetings of clubs or organizations: Not on file    Relationship status: Not on file  Other  Topics Concern  . Not on file  Social History Narrative  . Not on file    Review of Systems: A 12 point ROS discussed and pertinent positives are indicated in the HPI above.  All other systems are negative.  Review of Systems  Constitutional: Positive for activity change and fatigue. Negative for fever.  Respiratory: Negative for shortness of breath.   Cardiovascular: Negative for chest pain.  Gastrointestinal: Negative for abdominal pain.  Musculoskeletal: Positive for gait problem.  Neurological: Positive for dizziness, seizures, light-headedness and headaches. Negative for tremors, syncope, facial asymmetry, speech difficulty, weakness and numbness.  Psychiatric/Behavioral: Negative for behavioral problems and confusion.    Vital Signs: BP (!) 144/61 (BP Location: Left Arm)   Pulse 97   Temp 98.5 F (36.9 C) (Oral)   Resp 12   Ht 5\' 1"  (1.549 m)   Wt 160 lb (72.6 kg)   SpO2 93%   BMI 30.23 kg/m   Physical Exam  Constitutional: She is oriented to person, place, and time. She appears well-nourished.  HENT:  Head: Atraumatic.  Eyes: EOM are normal.  Neck: Neck supple.  Cardiovascular: Normal rate, regular rhythm and normal heart sounds.  Pulmonary/Chest: Effort normal and breath sounds normal.  Abdominal: Soft. Bowel  sounds are normal.  Musculoskeletal: Normal range of motion.  Neurological: She is alert and oriented to person, place, and time.  Skin: Skin is warm and dry.  Psychiatric: She has a normal mood and affect. Her behavior is normal. Judgment and thought content normal.  Nursing note and vitals reviewed.   Imaging: Dg Chest 2 View  Result Date: 10/15/2017 CLINICAL DATA:  Encounter for oxygen desaturation EXAM: CHEST - 2 VIEW COMPARISON:  10/13/2017 FINDINGS: Mild interstitial edema or infiltrates, more conspicuous than on prior exam, involving bases more than apices. Heart size upper limits normal. Previous median sternotomy and CABG. Aortic Atherosclerosis (ICD10-170.0). Small left pleural effusion. Old right fourth rib fracture deformity. IMPRESSION: 1. Mild interstitial edema/infiltrates with cardiomegaly and small effusion suggesting CHF. Electronically Signed   By: Corlis Leak  Hassell M.D.   On: 10/15/2017 14:40   Dg Sacrum/coccyx  Result Date: 10/13/2017 CLINICAL DATA:  Fall onto tailbone EXAM: SACRUM AND COCCYX - 2+ VIEW COMPARISON:  None. FINDINGS: There is no evidence of fracture or other focal bone lesions. IMPRESSION: Negative. Electronically Signed   By: Deatra RobinsonKevin  Herman M.D.   On: 10/13/2017 22:55   Ct Head Wo Contrast  Result Date: 10/14/2017 CLINICAL DATA:  77 y/o  F; follow-up of stroke. EXAM: CT HEAD WITHOUT CONTRAST TECHNIQUE: Contiguous axial images were obtained from the base of the skull through the vertex without intravenous contrast. COMPARISON:  10/13/2017 MRI of the head. FINDINGS: Brain: Stable chronic left perisylvian infarct. Stable distribution of left inferior cerebellum acute infarction without associated mass effect or hemorrhage. No new stroke, hemorrhage, extra-axial collection, hydrocephalus, or herniation. Stable chronic microvascular ischemic changes and volume loss of the brain. Vascular: Calcific atherosclerosis of the carotid siphons and vertebral arteries. Skull: Normal.  Negative for fracture or focal lesion. Sinuses/Orbits: No acute finding. Other: None. IMPRESSION: 1. Stable distribution of left inferior cerebellum acute infarction. No associated mass effect or hemorrhage. 2. No new acute intracranial abnormality identified. 3. Stable background of chronic microvascular ischemic changes, volume loss, and chronic infarcts of the brain. Electronically Signed   By: Mitzi HansenLance  Furusawa-Stratton M.D.   On: 10/14/2017 01:14    Labs:  CBC: Recent Labs    10/14/17 0230 10/15/17 0337 10/16/17 0245  WBC  10.6* 8.6 9.3  HGB 11.5* 11.5* 10.4*  HCT 35.1* 35.5* 32.2*  PLT 289 265 248    COAGS: Recent Labs    10/13/17 1629  INR 1.02    BMP: Recent Labs    10/13/17 1629 10/14/17 0906 10/15/17 0337  NA 135  --  131*  K 3.4* 3.7 3.6  CL 97*  --  92*  CO2 27  --  31  GLUCOSE 95  --  118*  BUN 9  --  8  CALCIUM 8.8*  --  8.5*  CREATININE 0.63  --  0.63  GFRNONAA >60  --  >60  GFRAA >60  --  >60    LIVER FUNCTION TESTS: Recent Labs    10/13/17 1629  BILITOT 0.9  AST 26  ALT 18  ALKPHOS 64  PROT 6.7  ALBUMIN 3.6    TUMOR MARKERS: No results for input(s): AFPTM, CEA, CA199, CHROMGRNA in the last 8760 hours.  Assessment and Plan:  CVA New Afib; dizziness; vertigo Cerebral artery stenosis Scheduled for cerebral arteriogram 7/22 (dye allergy: pre medicated for 12noon) Risks and benefits of cerebral angiogram with intervention were discussed with the patient including, but not limited to bleeding, infection, vascular injury, contrast induced renal failure, stroke or even death.  This interventional procedure involves the use of X-rays and because of the nature of the planned procedure, it is possible that we will have prolonged use of X-ray fluoroscopy.  Potential radiation risks to you include (but are not limited to) the following: - A slightly elevated risk for cancer  several years later in life. This risk is typically less than 0.5%  percent. This risk is low in comparison to the normal incidence of human cancer, which is 33% for women and 50% for men according to the [employer] Society. - Radiation induced injury can include skin redness, resembling a rash, tissue breakdown / ulcers and hair loss (which can be temporary or permanent).   The likelihood of either of these occurring depends on the difficulty of the procedure and whether you are sensitive to radiation due to previous procedures, disease, or genetic conditions.   IF your procedure requires a prolonged use of radiation, you will be notified and given written instructions for further action.  It is your responsibility to monitor the irradiated area for the 2 weeks following the procedure and to notify your physician if you are concerned that you have suffered a radiation induced injury.    All of the patient's questions were answered, patient is agreeable to proceed.  Consent signed and in chart.  Thank you for this interesting consult.  I greatly enjoyed meeting MIU CHIONG and look forward to participating in their care.  A copy of this report was sent to the requesting provider on this date.  Electronically Signed: Robet Leu, PA-C 10/16/2017, 9:46 AM   I spent a total of 40 Minutes    in face to face in clinical consultation, greater than 50% of which was counseling/coordinating care for cerebral arteriogram

## 2017-10-17 ENCOUNTER — Inpatient Hospital Stay (HOSPITAL_COMMUNITY): Payer: Medicare Other

## 2017-10-17 DIAGNOSIS — I739 Peripheral vascular disease, unspecified: Secondary | ICD-10-CM

## 2017-10-17 HISTORY — PX: IR ANGIO VERTEBRAL SEL SUBCLAVIAN INNOMINATE BILAT MOD SED: IMG5366

## 2017-10-17 HISTORY — PX: IR ANGIO INTRA EXTRACRAN SEL COM CAROTID INNOMINATE UNI L MOD SED: IMG5358

## 2017-10-17 HISTORY — PX: IR ANGIO INTRA EXTRACRAN SEL COM CAROTID INNOMINATE UNI R MOD SED: IMG5359

## 2017-10-17 HISTORY — PX: IR ANGIOGRAM EXTREMITY BILATERAL: IMG653

## 2017-10-17 LAB — BASIC METABOLIC PANEL
Anion gap: 9 (ref 5–15)
BUN: 9 mg/dL (ref 8–23)
CHLORIDE: 85 mmol/L — AB (ref 98–111)
CO2: 33 mmol/L — ABNORMAL HIGH (ref 22–32)
Calcium: 8.4 mg/dL — ABNORMAL LOW (ref 8.9–10.3)
Creatinine, Ser: 0.52 mg/dL (ref 0.44–1.00)
GFR calc Af Amer: >60 mL/min (ref >60)
GFR calc non Af Amer: >60 mL/min (ref >60)
GLUCOSE: 128 mg/dL — AB (ref 70–99)
POTASSIUM: 3.4 mmol/L — AB (ref 3.5–5.1)
Sodium: 127 mmol/L — ABNORMAL LOW (ref 135–145)

## 2017-10-17 LAB — CBC
HEMATOCRIT: 33.5 % — AB (ref 36.0–46.0)
Hemoglobin: 11.4 g/dL — ABNORMAL LOW (ref 12.0–15.0)
MCH: 29.6 pg (ref 26.0–34.0)
MCHC: 34 g/dL (ref 30.0–36.0)
MCV: 87 fL (ref 78.0–100.0)
Platelets: 294 10*3/uL (ref 150–400)
RBC: 3.85 MIL/uL — ABNORMAL LOW (ref 3.87–5.11)
RDW: 13.1 % (ref 11.5–15.5)
WBC: 9.2 10*3/uL (ref 4.0–10.5)

## 2017-10-17 LAB — URINE CULTURE: Culture: >=100000 — AB

## 2017-10-17 LAB — PROTIME-INR
INR: 1.06
Prothrombin Time: 13.7 seconds (ref 11.4–15.2)

## 2017-10-17 MED ORDER — NITROFURANTOIN MACROCRYSTAL 100 MG PO CAPS: 100.0000 mg | ORAL_CAPSULE | Freq: Two times a day (BID) | ORAL | Status: DC

## 2017-10-17 MED ORDER — LIDOCAINE HCL 1 % IJ SOLN
INTRAMUSCULAR | Status: AC
  Filled 2017-10-17: qty 20

## 2017-10-17 MED ORDER — IOHEXOL 300 MG/ML  SOLN
150.0000 mL | Freq: Once | INTRAMUSCULAR | Status: AC | PRN
  Administered 2017-10-17: 60 mL via INTRA_ARTERIAL

## 2017-10-17 MED ORDER — MIDAZOLAM HCL 2 MG/2ML IJ SOLN
INTRAMUSCULAR | Status: AC
  Filled 2017-10-17: qty 2

## 2017-10-17 MED ORDER — LIDOCAINE HCL 1 % IJ SOLN
INTRAMUSCULAR | Status: AC | PRN
  Administered 2017-10-17: 10 mL

## 2017-10-17 MED ORDER — SODIUM CHLORIDE 0.9 % IV SOLN
INTRAVENOUS | Status: AC
  Administered 2017-10-17: 18:00:00 via INTRAVENOUS

## 2017-10-17 MED ORDER — POTASSIUM CHLORIDE CRYS ER 20 MEQ PO TBCR
40.0000 meq | EXTENDED_RELEASE_TABLET | Freq: Once | ORAL | Status: AC
  Administered 2017-10-17: 40 meq via ORAL
  Filled 2017-10-17: qty 2

## 2017-10-17 MED ORDER — POTASSIUM CHLORIDE 10 MEQ/100ML IV SOLN
10.0000 meq | INTRAVENOUS | Status: DC
  Administered 2017-10-17: 10 meq via INTRAVENOUS
  Filled 2017-10-17 (×4): qty 100

## 2017-10-17 MED ORDER — IOPAMIDOL (ISOVUE-300) INJECTION 61%
INTRAVENOUS | Status: AC
  Administered 2017-10-17: 70 mL
  Filled 2017-10-17: qty 50

## 2017-10-17 MED ORDER — NITROFURANTOIN MONOHYD MACRO 100 MG PO CAPS
100.0000 mg | ORAL_CAPSULE | Freq: Two times a day (BID) | ORAL | Status: DC
  Administered 2017-10-17 – 2017-10-18 (×3): 100 mg via ORAL
  Filled 2017-10-17 (×3): qty 1

## 2017-10-17 MED ORDER — FENTANYL CITRATE (PF) 100 MCG/2ML IJ SOLN
INTRAMUSCULAR | Status: AC
  Filled 2017-10-17: qty 2

## 2017-10-17 MED ORDER — IOPAMIDOL (ISOVUE-300) INJECTION 61%
INTRAVENOUS | Status: AC
  Filled 2017-10-17: qty 100

## 2017-10-17 MED ORDER — FENTANYL CITRATE (PF) 100 MCG/2ML IJ SOLN
INTRAMUSCULAR | Status: AC | PRN
  Administered 2017-10-17: 25 ug via INTRAVENOUS

## 2017-10-17 MED ORDER — HEPARIN SODIUM (PORCINE) 1000 UNIT/ML IJ SOLN
INTRAMUSCULAR | Status: AC
  Filled 2017-10-17: qty 1

## 2017-10-17 MED ORDER — HEPARIN SODIUM (PORCINE) 1000 UNIT/ML IJ SOLN
INTRAMUSCULAR | Status: AC | PRN
  Administered 2017-10-17: 1000 [IU] via INTRAVENOUS

## 2017-10-17 NOTE — Progress Notes (Signed)
Patient name: Diamond Clasatty B Dolliver MRN: [id number] DOB: Nov 21, 1940 Sex: female    HPI: Diamond [name] is a 77 y.o. female seen in consultation for super aortic trunk atherosclerosis.  She does have a history of prior strokes.  She was admitted after having an episode of syncope and severe nausea and vomiting and falling.  She is a quite alert and oriented and very clear on her medical history.  She reports that she did not have any focal weakness.  Had focal weakness and strokes in the [place] 2000's.  Imaging revealed left cerebellar stroke.  Carotid duplex suggested potential severe carotid stenosis.  She does have a long history of coronary and peripheral disease.  She underwent a left common carotid to subclavian bypass probably around 2003.  I do not have these records.  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  She did undergo formal cerebral arteriogram today and I have reviewed these and discussed them with the patient as well.  She does not have any lower extremity claudication type  Current Facility-Administered Medications  Medication Dose Route Frequency Provider Last Rate Last Dose  . 0.9 %  sodium chloride infusion   Intravenous Continuous Deveshwar, Sanjeev, MD      . acetaminophen (TYLENOL) tablet 650 mg  650 mg Oral Q4H PRN Alessandra BevelsKamineni, Neelima, MD   650 mg at 10/15/17 2220   Or  . acetaminophen (TYLENOL) solution 650 mg  650 mg Per Tube Q4H PRN Alessandra BevelsKamineni, Neelima, MD       Or  . acetaminophen (TYLENOL) suppository 650 mg  650 mg Rectal Q4H PRN Alessandra BevelsKamineni, Neelima, MD      . ALPRAZolam Prudy Feeler(XANAX) tablet 0.5 mg  0.5 mg Oral Q6H PRN Adhikari, Amrit, MD      . aspirin EC tablet 81 mg  81 mg Oral Daily Alessandra BevelsKamineni, Neelima, MD   81 mg at 10/17/17 1057  . atorvastatin (LIPITOR) tablet 80 mg  80 mg Oral q1800 Alessandra BevelsKamineni, Neelima, MD   80 mg at 10/16/17 1820  . carvedilol (COREG) tablet 3.125 mg  3.125 mg Oral BID Burnadette PopAdhikari, Amrit, MD   3.125 mg at 10/17/17 1057  .  famotidine (PEPCID) tablet 20 mg  20 mg Oral Daily Burnadette PopAdhikari, Amrit, MD   20 mg at 10/17/17 1057  . fentaNYL (SUBLIMAZE) 100 MCG/2ML injection           . fentaNYL (SUBLIMAZE) injection 12.5 mcg  12.5 mcg Intravenous Q6H PRN Alessandra BevelsKamineni, Neelima, MD   12.5 mcg at 10/16/17 0410  . heparin 1000 UNIT/ML injection           . hydrALAZINE (APRESOLINE) tablet 25 mg  25 mg Oral Q8H PRN Kamineni, Neelima, MD      . iopamidol (ISOVUE-300) 61 % injection           . lidocaine (XYLOCAINE) 1 % (with pres) injection           . meclizine (ANTIVERT) tablet 25 mg  25 mg Oral BID PRN Alessandra BevelsKamineni, Neelima, MD   25 mg at 10/16/17 2312  . nitrofurantoin (macrocrystal-monohydrate) (MACROBID) capsule 100 mg  100 mg Oral Q12H Adhikari, Amrit, MD   100 mg at 10/17/17 1056  . ondansetron (ZOFRAN) injection 4 mg  4 mg Intravenous Q6H PRN Alessandra BevelsKamineni, Neelima, MD   4 mg at 10/16/17 2312  . senna-docusate (Senokot-S) tablet 1 tablet  1 tablet Oral QHS PRN Alessandra BevelsKamineni, Neelima, MD      . traMADol (ULTRAM) tablet 50 mg  50 mg Oral  Q6H PRN Alessandra Bevels, MD   50 mg at 10/17/17 1056     PHYSICAL EXAM: Vitals:   10/17/17 1348 10/17/17 1350 10/17/17 1354 10/17/17 1404  BP: 136/87 (!) 149/67 117/85 (!) 143/63  Pulse: 97 98 93 96  Resp: 17 12 18 17   Temp:      TempSrc:      SpO2: 97% 99% 96% 94%  Weight:      Height:        Review of symptoms is reviewed and her history and physical with nothing to add  GENERAL: The patient is a well-nourished female, in no acute distress. The vital signs are documented above. Vascular exam: 2+ left radial pulse and absent right radial pulse.  2+ left dorsalis pedis pulse and absent right pedal pulses. Abdomen soft and nontender Neuro: Grossly intact.  Able to raise her arms without difficulty.  Did not examine her gait. Respirations nonlabored Skin without ulceration  IR imaging: Formal cerebral arteriogram reveals patency of her common carotid artery on the left.  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  She does have a patent left common carotid to subclavian bypass.  Her carotid bifurcation on the left reveals moderate 50% stenosis.  The vertebral is patent and appears to have antegrade flow off the subclavian artery.  There is retrograde flow down the right vertebral artery.  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  Her left iliac system is widely patent  MEDICAL ISSUES: New posterior circulation stroke on the left.  Does have a patent left common carotid to subclavian bypass with no evidence of flow disturbance.  MRI suggested left vertebral occlusion.  I am unable to determine this based on her cerebral imaging.  I do not see any role for surgical treatment.  Discussed this at length with the patient.  Will review further with stroke service.   Larina Earthly, MD FACS [facility] Tel [phone] Pager [phone]

## 2017-10-17 NOTE — Sedation Documentation (Addendum)
Pressure hold complete to R  groin, dsg intact, bedrest 4 hrs

## 2017-10-17 NOTE — Sedation Documentation (Addendum)
IR team holding pressure R  groin, VPAD on, bedrest 4 hrs

## 2017-10-17 NOTE — Procedures (Signed)
S/P Arch aortogram,and Lt common carotid arteriogram. RT CFa approach. Findings. 1.Occluded innominate artery,and Lt subclavian artery. 2.Patent Lr CCA  To Lt subclavian artery graft with a 50 % stenosis. 3.Approx 50 to 60 % stenosis of Lt ICA prox . 4. RT  subclavian steal.

## 2017-10-17 NOTE — Progress Notes (Addendum)
PROGRESS NOTE    Diamond [name]  ZOX:[id number] DOB: 1940/04/19 DOA: 10/13/2017 PCP: System, Pcp Not In   Brief Narrative: Patient is a 77 year old female with past medical history of hyperlipidemia, hypertension, seizures, strokes, coronary artery disease, status post CABG who was sent from [facility] after she presented there with complaints of dizziness.  It was associated with nausea and vomiting.  She was noted to be hypertensive on presentation.  MRI/MRI brain obtained at [facility] showed occlusion of the left  vertebral artery and acute left posterior inferior cerebellar infarct.  Also noted to be in new onset A. fib with RVR. She has been transferred here. Neurology has been consulted.  Patient was started on anticoagulation with heparin drip for A. fib with RVR which has been changed to eliquis now.Planned for cerebal angiogram today.  Assessment & Plan:   Principal Problem:   Acute ischemic stroke [facility]) Active Problems:   Coronary artery disease involving native coronary artery of native heart without angina pectoris   Dyslipidemia   Essential hypertension   History of stroke   Hx of CABG   Hyperlipidemia   Vertebral artery disease (HCC)  Acute ischemic stroke: MRA/MRI brain obtained at [facility] showed occlusion of the left  vertebral artery and acute left posterior inferior cerebellar infarct.  Neurology is following.  Echocardiogram showed EF of  65%-70%,no wall motion abnormalities.   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  Plan for cerebral angiogram today.Neurology following. She was on  Plavix and aspirin at home. Started Eliquis but has been held for tomorrow's angiogram.Continue Aspirin. Continue statin.  Patient has history of 3 strokes in the past.  She has residual weakness on the right side.   PT/OT recommending CIR.   Hemoglobin A1c of 6.2.  Vertigo/ataxia: Secondary to stroke in the  posterior circulation.The symptoms have significantly improved.  Continue meclizine as needed.  New onset A. fib: Currently rate is controlled. She is on carvedilol at home which will continue .  She underwent echocardiogram which showed moderate concentric left ventricular hypertrophy, ejection fraction of 65 to 70%, no regional wall motion abnormalities.  She was started on anti-coagulation with heparin drip.  Anti-coagulation has been changed to Eliquis . Follow up with her own cardiologist on discharge.  Respiratory insufficiency: Currently she is on supplemental oxygen.We will try to wean her off oxygen.  Chest x-ray showed interstitial edema suggesting of CHF but echocardiogram shows normal ejection fraction, no diastolic abnormality.  Fluids have been discontinued.  Given a dose of Lasix earlier for  bibasilar crackles with significant improvement.  History of coronary artery disease: Status post CABG in 2005.  Was on Plavix and aspirin at home. Plavix D/Ced.  She follows with cardiology Dr. Tomie China.  Continue statin.  HTN: Currently she is normotensive.She is on carvedilol and amlodipine at home.  Hyperlipidemia:Resume Lipitor 80 mg daily.  Fall/sacral pain: X-ray did not show any fracture dislocation.  Hypokalemia: Supplemented.  Hyponatremia: Most likely secondary to Lasix.  We will continue to monitor her BMP.  Headache: Complains of headache on the occipital region secondary to fall.  Continue pain medications as needed.  Urinary tract infection: Patient complained of dysuria on presentation.  Urine culture grew E. coli.  Started on nitrofurantoin.  Urinary Retention: Foley was placed.  Will attempt to take out the Foley today.  DVT prophylaxis:Heparin [place] Code Status: Full Family Communication: None present at the bedside Disposition  Plan: CIR after  undergoing cerebral angiogram .  Consultants: Neurology  Procedures: None  Antimicrobials: None  Subjective:  Patient  seen and examined the bedside this morning.  Remains comfortable.  Denies any dizziness.    Denies any shortness of breath.  Waiting for cerebral angiogram.   Objective: Vitals:   10/16/17 2010 10/17/17 0038 10/17/17 0354 10/17/17 0832  BP: (!) 149/61 138/62 (!) 124/54 (!) 146/64  Pulse: 97 (!) 102  95  Resp: 17 17  11   Temp: 98.4 F (36.9 C) 98.3 F (36.8 C) 98.1 F (36.7 C) 98.5 F (36.9 C)  TempSrc: Oral  Oral   SpO2: 96% 97% 97% 98%  Weight:      Height:        Intake/Output Summary (Last 24 hours) at 10/17/2017 1039 Last data filed at 10/17/2017 0700 Gross per 24 hour  Intake 120 ml  Output 300 ml  Net -180 ml   Filed Weights   10/13/17 1727  Weight: 72.6 kg (160 lb)    Examination:  General exam: Appears calm and comfortable ,Not in distress,average built HEENT:PERRL,Oral mucosa moist, Ear/Nose normal on gross exam Respiratory system: Bilateral decreased air entry at the bases  cardiovascular system: S1 & S2 heard, RRR. No JVD, murmurs, rubs, gallops or clicks. Gastrointestinal system: Abdomen is nondistended, soft and nontender. No organomegaly or masses felt. Normal bowel sounds heard. Central nervous system: Alert and oriented. Mild  right-sided  weakness Extremities: No edema, no clubbing ,no cyanosis, distal peripheral pulses palpable. Skin: No rashes, lesions or ulcers,no icterus ,no pallor MSK: Normal muscle bulk,tone ,power Psychiatry: Judgement and insight appear normal. Mood & affect appropriate.     Data Reviewed: I have personally reviewed following labs and imaging studies  CBC: Recent Labs  Lab 10/14/17 0230 10/15/17 0337 10/16/17 0245 10/17/17 0423  WBC 10.6* 8.6 9.3 9.2  HGB 11.5* 11.5* 10.4* 11.4*  HCT 35.1* 35.5* 32.2* 33.5*  MCV 90.7 91.0 90.4 87.0  PLT 289 265 248 294   Basic Metabolic Panel: Recent Labs  Lab 10/13/17 1629 10/14/17 0906 10/15/17 0337 10/17/17 0423  NA 135  --  131* 127*  K 3.4* 3.7 3.6 3.4*  CL 97*  --   92* 85*  CO2 27  --  31 33*  GLUCOSE 95  --  118* 128*  BUN 9  --  8 9  CREATININE 0.63  --  0.63 0.52  CALCIUM 8.8*  --  8.5* 8.4*   GFR: Estimated Creatinine Clearance: 53.6 mL/min (by C-G formula based on SCr of 0.52 mg/dL). Liver Function Tests: Recent Labs  Lab 10/13/17 1629  AST 26  ALT 18  ALKPHOS 64  BILITOT 0.9  PROT 6.7  ALBUMIN 3.6   No results for input(s): LIPASE, AMYLASE in the last 168 hours. No results for input(s): AMMONIA in the last 168 hours. Coagulation Profile: Recent Labs  Lab 10/13/17 1629 10/17/17 0423  INR 1.02 1.06   Cardiac Enzymes: No results for input(s): CKTOTAL, CKMB, CKMBINDEX, TROPONINI in the last 168 hours. BNP (last 3 results) No results for input(s): PROBNP in the last 8760 hours. HbA1C: No results for input(s): HGBA1C in the last 72 hours. CBG: Recent Labs  Lab 10/15/17 1630  GLUCAP 104*   Lipid Profile: No results for input(s): CHOL, HDL, LDLCALC, TRIG, CHOLHDL, LDLDIRECT in the last 72 hours. Thyroid Function Tests: No results for input(s): TSH, T4TOTAL, FREET4, T3FREE, THYROIDAB in the last 72 hours. Anemia Panel: No results for input(s): VITAMINB12, FOLATE,  FERRITIN, TIBC, IRON, RETICCTPCT in the last 72 hours. Sepsis Labs: No results for input(s): PROCALCITON, LATICACIDVEN in the last 168 hours.  Recent Results (from the past 240 hour(s))  Culture, Urine     Status: Abnormal   Collection Time: 10/15/17  6:00 PM  Result Value Ref Range Status   Specimen Description URINE, CATHETERIZED  Final   Special Requests   Final    NONE Performed at [facility], [address] [place], [place] [address]    Culture >=100,000 COLONIES/mL ESCHERICHIA COLI (A)  Final   Report Status 10/17/2017 FINAL  Final   Organism ID, Bacteria ESCHERICHIA COLI (A)  Final      Susceptibility   Escherichia coli - MIC*    AMPICILLIN >=32 RESISTANT Resistant     CEFAZOLIN 8 SENSITIVE Sensitive     CEFTRIAXONE <=1 SENSITIVE Sensitive      CIPROFLOXACIN <=0.25 SENSITIVE Sensitive     GENTAMICIN <=1 SENSITIVE Sensitive     IMIPENEM <=0.25 SENSITIVE Sensitive     NITROFURANTOIN <=16 SENSITIVE Sensitive     TRIMETH/SULFA <=20 SENSITIVE Sensitive     AMPICILLIN/SULBACTAM >=32 RESISTANT Resistant     PIP/TAZO <=4 SENSITIVE Sensitive     Extended ESBL NEGATIVE Sensitive     * >=100,000 COLONIES/mL ESCHERICHIA COLI         Radiology Studies: Dg Chest 2 View  Result Date: 10/15/2017 CLINICAL DATA:  Encounter for oxygen desaturation EXAM: CHEST - 2 VIEW COMPARISON:  10/13/2017 FINDINGS: Mild interstitial edema or infiltrates, more conspicuous than on prior exam, involving bases more than apices. Heart size upper limits normal. Previous median sternotomy and CABG. Aortic Atherosclerosis (ICD10-170.0). Small left pleural effusion. Old right fourth rib fracture deformity. IMPRESSION: 1. Mild interstitial edema/infiltrates with cardiomegaly and small effusion suggesting CHF. Electronically Signed   By: Corlis Leak  Hassell M.D.   On: 10/15/2017 14:40        Scheduled Meds: . aspirin EC  81 mg Oral Daily  . atorvastatin  80 mg Oral q1800  . carvedilol  3.125 mg Oral BID  . diphenhydrAMINE  50 mg Oral Once  . famotidine  20 mg Oral Daily  . nitrofurantoin (macrocrystal-monohydrate)  100 mg Oral Q12H  . predniSONE  50 mg Oral Q6H   Continuous Infusions: . potassium chloride       LOS: 4 days    Time spent: 25  mins.More than 50% of that time was spent in counseling and/or coordination of care.      Burnadette PopAmrit Wendle Kina, MD [facility] Pager [phone]  If 7PM-7AM, please contact night-coverage [email or web address] Password [email or web address] 10/17/2017, 10:39 AM

## 2017-10-17 NOTE — Sedation Documentation (Signed)
Patient is resting comfortably. 

## 2017-10-17 NOTE — Progress Notes (Signed)
OT Cancellation Note  Patient Details Name: Leticia Clasatty B Kann MRN: [id number] DOB: Oct 10, 1940   Cancelled Treatment:    Reason Eval/Treat Not Completed: Patient at procedure or test/ unavailable (angiogram). Will follow up as schedule permits.  Marcy SirenBreanna Baldemar Dady, OT Pager [phone] 10/17/2017   Orlando PennerBreanna L Adelyna Brockman 10/17/2017, 1:42 PM

## 2017-10-17 NOTE — Progress Notes (Signed)
Inpatient Rehabilitation-Admissions Coordinator    Met with patient at the bedside to discuss team's recommendation for inpatient rehabilitation. Shared booklets, expectations while in CIR, expected length of stay, and anticipated functional level at DC. Pt wanting to pursue CIR at this time. Pt and daughter, Juliann Pulse, aware that her dispo needs will be based on her functional level and if she meets criteria once medically ready. Of note, pt was being transported to angiogram today at end of meeting.  Plan to follow for timing of medical readiness, and IP Rehab bed availability. Call if questions.   Jhonnie Garner, OTR/L  Rehab Admissions Coordinator  [phone] 10/17/2017 1:02 PM

## 2017-10-17 NOTE — Progress Notes (Signed)
PT Cancellation Note  Patient Details Name: Diamond [name] MRN: [id number] DOB: 04-12-1940   Cancelled Treatment:    Reason Eval/Treat Not Completed: Patient at procedure or test/unavailable Transport to angiogram. Will follow-up as time allows.  Kipp LaurenceStephanie R Omeed Osuna, PT, DPT 10/17/17 1:11 PM Pager: [phone]

## 2017-10-17 NOTE — Sedation Documentation (Signed)
IR team continues pressure hold  

## 2017-10-17 NOTE — Progress Notes (Signed)
Patient underwent angiogram today showing occluded innominate and left subclavian artery with subclavian steal. I have consulted vascular surgery on call. Patient can be discharged to CIR as soon as bed is available.

## 2017-10-17 NOTE — Progress Notes (Signed)
Patient arrived to the unit from IR Alert and oriented time. She is laying flat. Denies pain. Be in the lowest positions with alarm set. Gauze and transparent bandage to the right groin clean dry and intact. No bleeding  Nurse will continue to monitor

## 2017-10-18 ENCOUNTER — Ambulatory Visit: Payer: Self-pay | Admitting: Cardiology

## 2017-10-18 DIAGNOSIS — T8189XA Other complications of procedures, not elsewhere classified, initial encounter: Secondary | ICD-10-CM | POA: Diagnosis not present

## 2017-10-18 DIAGNOSIS — G458 Other transient cerebral ischemic attacks and related syndromes: Secondary | ICD-10-CM | POA: Diagnosis not present

## 2017-10-18 DIAGNOSIS — W19XXXD Unspecified fall, subsequent encounter: Secondary | ICD-10-CM | POA: Diagnosis not present

## 2017-10-18 DIAGNOSIS — R0689 Other abnormalities of breathing: Secondary | ICD-10-CM | POA: Diagnosis not present

## 2017-10-18 DIAGNOSIS — I6502 Occlusion and stenosis of left vertebral artery: Secondary | ICD-10-CM | POA: Diagnosis not present

## 2017-10-18 DIAGNOSIS — I639 Cerebral infarction, unspecified: Secondary | ICD-10-CM | POA: Diagnosis not present

## 2017-10-18 DIAGNOSIS — R262 Difficulty in walking, not elsewhere classified: Secondary | ICD-10-CM | POA: Diagnosis not present

## 2017-10-18 DIAGNOSIS — Z743 Need for continuous supervision: Secondary | ICD-10-CM | POA: Diagnosis not present

## 2017-10-18 DIAGNOSIS — R279 Unspecified lack of coordination: Secondary | ICD-10-CM | POA: Diagnosis not present

## 2017-10-18 DIAGNOSIS — I10 Essential (primary) hypertension: Secondary | ICD-10-CM | POA: Diagnosis not present

## 2017-10-18 DIAGNOSIS — I63542 Cerebral infarction due to unspecified occlusion or stenosis of left cerebellar artery: Secondary | ICD-10-CM | POA: Diagnosis not present

## 2017-10-18 DIAGNOSIS — Z951 Presence of aortocoronary bypass graft: Secondary | ICD-10-CM | POA: Diagnosis not present

## 2017-10-18 DIAGNOSIS — E876 Hypokalemia: Secondary | ICD-10-CM | POA: Diagnosis not present

## 2017-10-18 DIAGNOSIS — I480 Paroxysmal atrial fibrillation: Secondary | ICD-10-CM | POA: Diagnosis not present

## 2017-10-18 DIAGNOSIS — R42 Dizziness and giddiness: Secondary | ICD-10-CM | POA: Diagnosis not present

## 2017-10-18 DIAGNOSIS — T8189XD Other complications of procedures, not elsewhere classified, subsequent encounter: Secondary | ICD-10-CM | POA: Diagnosis not present

## 2017-10-18 DIAGNOSIS — I2510 Atherosclerotic heart disease of native coronary artery without angina pectoris: Secondary | ICD-10-CM | POA: Diagnosis not present

## 2017-10-18 DIAGNOSIS — R51 Headache: Secondary | ICD-10-CM | POA: Diagnosis not present

## 2017-10-18 DIAGNOSIS — R52 Pain, unspecified: Secondary | ICD-10-CM | POA: Diagnosis not present

## 2017-10-18 DIAGNOSIS — I63532 Cerebral infarction due to unspecified occlusion or stenosis of left posterior cerebral artery: Secondary | ICD-10-CM | POA: Diagnosis not present

## 2017-10-18 DIAGNOSIS — E871 Hypo-osmolality and hyponatremia: Secondary | ICD-10-CM | POA: Diagnosis not present

## 2017-10-18 DIAGNOSIS — E785 Hyperlipidemia, unspecified: Secondary | ICD-10-CM | POA: Diagnosis not present

## 2017-10-18 DIAGNOSIS — N390 Urinary tract infection, site not specified: Secondary | ICD-10-CM | POA: Diagnosis not present

## 2017-10-18 DIAGNOSIS — R338 Other retention of urine: Secondary | ICD-10-CM | POA: Diagnosis not present

## 2017-10-18 DIAGNOSIS — R270 Ataxia, unspecified: Secondary | ICD-10-CM | POA: Diagnosis not present

## 2017-10-18 DIAGNOSIS — I4891 Unspecified atrial fibrillation: Secondary | ICD-10-CM | POA: Diagnosis not present

## 2017-10-18 DIAGNOSIS — R339 Retention of urine, unspecified: Secondary | ICD-10-CM | POA: Diagnosis not present

## 2017-10-18 LAB — BASIC METABOLIC PANEL
ANION GAP: 7 (ref 5–15)
BUN: 18 mg/dL (ref 8–23)
CHLORIDE: 89 mmol/L — AB (ref 98–111)
CO2: 33 mmol/L — AB (ref 22–32)
Calcium: 8.8 mg/dL — ABNORMAL LOW (ref 8.9–10.3)
Creatinine, Ser: 0.63 mg/dL (ref 0.44–1.00)
GFR calc Af Amer: >60 mL/min (ref >60)
GFR calc non Af Amer: >60 mL/min (ref >60)
GLUCOSE: 139 mg/dL — AB (ref 70–99)
POTASSIUM: 4.3 mmol/L (ref 3.5–5.1)
Sodium: 129 mmol/L — ABNORMAL LOW (ref 135–145)

## 2017-10-18 MED ORDER — CARVEDILOL 3.125 MG PO TABS: 3.1250 mg | ORAL_TABLET | Freq: Two times a day (BID) | ORAL | 0 refills | Status: AC

## 2017-10-18 MED ORDER — NITROFURANTOIN MONOHYD MACRO 100 MG PO CAPS: 100.0000 mg | ORAL_CAPSULE | Freq: Two times a day (BID) | ORAL | 0 refills | Status: DC

## 2017-10-18 MED ORDER — APIXABAN 5 MG PO TABS: 5.0000 mg | ORAL_TABLET | Freq: Two times a day (BID) | ORAL | 0 refills | Status: DC

## 2017-10-18 MED ORDER — APIXABAN 5 MG PO TABS
5.0000 mg | ORAL_TABLET | Freq: Two times a day (BID) | ORAL | Status: DC
  Administered 2017-10-18: 5 mg via ORAL
  Filled 2017-10-18: qty 1

## 2017-10-18 MED ORDER — ALPRAZOLAM 0.5 MG PO TABS: 0.5000 mg | ORAL_TABLET | Freq: Four times a day (QID) | ORAL | 0 refills | Status: DC | PRN

## 2017-10-18 MED ORDER — SODIUM CHLORIDE 1 G PO TABS
1.0000 g | ORAL_TABLET | Freq: Two times a day (BID) | ORAL | Status: DC
  Administered 2017-10-18: 1 g via ORAL
  Filled 2017-10-18 (×2): qty 1

## 2017-10-18 MED ORDER — SODIUM CHLORIDE 1 G PO TABS: 1.0000 g | ORAL_TABLET | Freq: Three times a day (TID) | ORAL | Status: DC

## 2017-10-18 NOTE — Progress Notes (Signed)
STROKE TEAM PROGRESS NOTE   SUBJECTIVE (INTERVAL HISTORY) Her family is  at the bedside.she has met Dr early from [facility] and no intervention is planned  OBJECTIVE Temp:  [98.6 F (37 C)-98.8 F (37.1 C)] 98.6 F (37 C) (07/23 0813) Pulse Rate:  [54-105] 90 (07/23 0423) Cardiac Rhythm: Atrial fibrillation (07/23 0800) Resp:  [11-20] 12 (07/23 0423) BP: (94-155)/(5-140) 114/65 (07/23 0423) SpO2:  [94 %-99 %] 97 % (07/23 0423)  CBC:  Recent Labs  Lab 10/16/17 0245 10/17/17 0423  WBC 9.3 9.2  HGB 10.4* 11.4*  HCT 32.2* 33.5*  MCV 90.4 87.0  PLT 248 673    Basic Metabolic Panel:  Recent Labs  Lab 10/17/17 0423 10/18/17 0341  NA 127* 129*  K 3.4* 4.3  CL 85* 89*  CO2 33* 33*  GLUCOSE 128* 139*  BUN 9 18  CREATININE 0.52 0.63  CALCIUM 8.4* 8.8*    Lipid Panel:     Component Value Date/Time   CHOL 120 10/14/2017 0230   TRIG 39 10/14/2017 0230   HDL 67 10/14/2017 0230   CHOLHDL 1.8 10/14/2017 0230   VLDL 8 10/14/2017 0230   LDLCALC 45 10/14/2017 0230   HgbA1c:  Lab Results  Component Value Date   HGBA1C 6.2 (H) 10/14/2017   Urine Drug Screen:     Component Value Date/Time   LABOPIA NONE DETECTED 10/13/2017 1749   COCAINSCRNUR NONE DETECTED 10/13/2017 1749   LABBENZ NONE DETECTED 10/13/2017 1749   AMPHETMU NONE DETECTED 10/13/2017 1749   THCU NONE DETECTED 10/13/2017 1749   LABBARB (A) 10/13/2017 1749    Result not available. Reagent lot number recalled by manufacturer.    Alcohol Level No results found for: ETH  IMAGING  Ct Head Wo Contrast 10/14/2017 IMPRESSION:  1. Stable distribution of left inferior cerebellum acute infarction. No associated mass effect or hemorrhage. 2. No new acute intracranial abnormality identified.  3. Stable background of chronic microvascular ischemic changes, volume loss, and chronic infarcts of the brain.   Transthoracic Echocardiogram  10/14/2017 Study Conclusions - Left ventricle: The cavity size was normal. There  was moderate   concentric hypertrophy. Systolic function was vigorous. The   estimated ejection fraction was in the range of 65% to 70%. Wall   motion was normal; there were no regional wall motion   abnormalities. - Aortic valve: There was mild regurgitation. Valve area (VTI): 2.6   cm^2. Valve area (Vmax): 2.02 cm^2. Valve area (Vmean): 2.47   cm^2. - Mitral valve: There was mild regurgitation. - Left atrium: The atrium was mildly dilated. - Right atrium: The atrium was mildly dilated. - Tricuspid valve: There was moderate regurgitation. - Pulmonary arteries: Systolic pressure was mildly increased. PA   peak pressure: 37 mm Hg (S). Impressions: - Hyperdynamic LVEF. Thickened aortic valve leaflets and   significant calcifications in the coronary sinuses, no aortic   stenosis, mild regurgitation. Mild mitral and moderate tricuspid   regurgitation. PM in place. Mildly dilated ascending aorta   measuring 41 mm.  Bilateral Carotid Dopplers  Severely abnormal, dampened, preocclusive flow with a possible short segment of occlusive disease at the right distal CCA with retrograde flow in Right ECA.   Velocities in the left ICA are consistent with a 80-99% stenosis. A patent left subclavian artery to common carotid artery.    Left vertebral artery not be able to visualized.   Cerebral angio 1.Occluded innominate artery,and Lt subclavian artery. 2.Patent Lr CCA  To Lt subclavian artery graft with a  50 % stenosis. 3.Approx 50 to 60 % stenosis of Lt ICA prox . 4. RT  subclavian steal.    PHYSICAL EXAM  Temp:  [98.6 F (37 C)-98.8 F (37.1 C)] 98.6 F (37 C) (07/23 0813) Pulse Rate:  [54-105] 90 (07/23 0423) Resp:  [11-20] 12 (07/23 0423) BP: (94-155)/(5-140) 114/65 (07/23 0423) SpO2:  [94 %-99 %] 97 % (07/23 0423)  General - Well nourished, well developed,elderly caucasain lady.  Ophthalmologic - fundi not visualized due to noncooperation.  Cardiovascular - irregularly irregular  heart rate and rhythm.  Mental Status -  Level of arousal and orientation to time, place, and person were intact. Language including expression, naming, repetition, comprehension was assessed and found intact.  Cranial Nerves II - XII - II - Visual field intact OU. III, IV, VI - Extraocular movements intact. V - Facial sensation intact bilaterally. VII - Facial movement intact bilaterally. VIII - Hearing & vestibular intact bilaterally, no nystagmus. X - Palate elevates symmetrically. XI - Chin turning & shoulder shrug intact bilaterally. XII - Tongue protrusion intact.  Motor Strength - The patient's strength was normal in all extremities except left lower extremity 4+/5 and left ankle dorsiflexion 4/5 and pronator drift was absent.  Bulk was normal and fasciculations were absent.   Motor Tone - Muscle tone was assessed at the neck and appendages and was normal.  Reflexes - The patient's reflexes were symmetrical in all extremities and she had no pathological reflexes.  Sensory - Light touch, temperature/pinprick were assessed and were symmetrical.    Coordination - The patient had normal movements in the hands and left foot with no ataxia or dysmetria, not able to perform on right foot due to weakness.  Tremor was absent.  Gait and Station - deferred.   ASSESSMENT/PLAN [name] is a 76 y.o. female with history of a previous stroke, hypertension, hyperlipidemia, coronary artery disease, subclavian steal syndrome, and new onset atrial fibrillation presenting with vertigo, diaphoresis, nausea and vomiting. She did not receive IV t-PA due to late presentation.  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 large vessel disease vs. steal syndrome.  Resultant  Chronic right LE weakness and mild foot drop  CT head - left inferior cerebellum acute infarction.  MRI head - left PICA territory infarct, chronic left MCA  infarct  MRA head - left VA occlusion, left PICA not visualized  Carotid Doppler - Severely abnormal, dampened, preocclusive flow with a possible short segment of occlusive disease at the right distal CCA with retrograde flow in Right ECA.   Velocities in the left ICA are consistent with a 80-99% stenosis. A patent left subclavian artery to common carotid artery.    Left vertebral artery not be able to visualized.  2D Echo - EF 65% to 70%. No cardiac source of emboli identified.  LDL - 45  HgbA1c - 6.2  VTE prophylaxis - IV heparin   aspirin 81 mg daily and clopidogrel 75 mg daily prior to admission, now on aspirin 81 mg daily and Eliquis (apixaban) daily. Off IV heparin.   Patient counseled to be compliant with her antithrombotic medications  Ongoing aggressive stroke risk factor management  Therapy recommendations:  SNF Disposition:  SNF Cerebral vascular stenosis/occlusion with hx of stroke  09/1993 massive stroke with seizure  2001 left MCA stroke found to have left CCA occlusion, made subclavian to left CCA bypass  CUS showed right distal CCA high grade  stenosis or occlusion with retrograde flow in Right ECA to supply right ICA  left ICA are consistent with a 80-99% stenosis   Patent left subclavian artery to common carotid artery  MRA and CUS showed left vertebral artery occluded  CTA head and neck pending to further elaborate anatomy  May consider cerebral angiogram for further evaluation if CTA head and neck still not conclusive.  Pt current left PICA infarct could be due to afib, or left VA occlusion, or due to subclavian steal, pending further CTA head and neck or cerebral angiogram  PAF, new diagnosis  Tele showed afib in ER  Was on heparin drip  Now transition to eliquis  Rate controlled  CAD s/p CABG  2005  Following with cardiology, no afib found  Until this admission  Was on ASA and plavix  plavix stopped after starting eliquis  Continue ASA    Hypertension  Stable . Permissive hypertension (OK if < 220/120) but gradually normalize in 5-7 days . Long-term BP goal 130-150 due to large vessel stenosis/occlusion  Hyperlipidemia  Lipid lowering medication PTA: Lipitor 80 mg daily  LDL 45, goal < 70  Current lipid lowering medication: Lipitor 80 mg daily  Continue statin at discharge  Other Stroke Risk Factors  Advanced age  Former cigarette smoker - quit  ETOH use, advised to drink no more than 1 alcoholic beverage per day.  Obesity, Body mass index is 30.23 kg/m., recommend weight loss, diet and exercise as appropriate   Other Active Problems  Seizure disorder on [facility] day # 5  the patient had cerebral angiogram which does not show any lesion amenable to endovascular treatment at the present time. She will need close 6 monthly follow-up carotid ultrasound studies and follow up with vascular surgery. Stroke team will sign off.D/W  Dr. Tawanna Solo.and family at [place] than 50% time during this 25 minute visit was spent on counseling and coordination of care about her stroke, discussion about significant occlusive extracranial vascular disease, IV dye allergies and answered questions Antony Contras, MD Medical Director [place] Pager: [phone] 10/18/2017 12:34 PM To contact Stroke Continuity provider, please refer to [email or web address] After hours, contact General Neurology

## 2017-10-18 NOTE — Clinical Social Work Note (Signed)
Clinical Social Work Assessment  Patient Details  Name: Diamond [name] MRN: [id number] Date of Birth: 1941/01/17  Date of referral:  10/18/17               Reason for consult:  Facility Placement                Permission sought to share information with:  Facility Medical sales representativeContact Representative, Family Supports Permission granted to share information::  Yes, Verbal Permission Granted  Name::     Development worker, international aidKathy  Agency::  [name]  Relationship::  Daughter  Contact Information:     Housing/Transportation Living arrangements for the past 2 months:  Single Family Home Source of Information:  Patient, Medical Team Patient Interpreter Needed:  None Criminal Activity/Legal Involvement Pertinent to Current Situation/Hospitalization:  No - Comment as needed Significant Relationships:  Adult Children, Siblings Lives with:  Self Do you feel safe going back to the place where you live?  Yes Need for family participation in patient care:  No (Coment)  Care giving concerns:  Patient from home alone and requiring rehabilitation at this time prior to returning home.   Social Worker assessment / plan:  CSW alerted by rehab admissions coordinator that patient is inappropriate for CIR admission, and is agreeable to SNF with preference for [facility] in [place]. CSW completed referral and discussed with admissions at [facility] [place]. CSW alerted patient and daughter that patient can transition to [facility] in [place] this afternoon. CSW requested that MD contact patient's daughter as she had multiple questions about medical workup. CSW set up patient transport.  Employment status:  Retired Health and safety inspectornsurance information:  Medicare PT Recommendations:  Inpatient Rehab Consult Information / Referral to community resources:  Skilled Nursing Facility  Patient/Family's Response to care:  Patient and daughter agreeable to SNF placement.  Patient/Family's Understanding of and Emotional Response to Diagnosis, Current Treatment,  and Prognosis:  Patient and daughter both acknowledged that patient is unsafe to return home at this time, and there is no one that can be available to assist the patient 24 hours a day upon her return home. Patient and daughter are hopeful that the patient can rehab and return home alone, but daughter had questions about what will need to happen if the patient ends up needing to transition to a long term care placement after SNF. Patient's daughter also had multiple questions about the patient's medical workup and would like to speak with the MD.  Emotional Assessment Appearance:  Appears stated age Attitude/Demeanor/Rapport:  Engaged Affect (typically observed):  Pleasant Orientation:  Oriented to Self, Oriented to Place, Oriented to  Time, Oriented to Situation Alcohol / Substance use:  Not Applicable Psych involvement (Current and /or in the community):  No (Comment)  Discharge Needs  Concerns to be addressed:  Care Coordination Readmission within the last 30 days:  No Current discharge risk:  Dependent with Mobility, Lives alone Barriers to Discharge:  No Barriers Identified   Diamond Lenislizabeth M Selicia Windom, LCSW 10/18/2017, 12:45 PM

## 2017-10-18 NOTE — Discharge Summary (Addendum)
Physician Discharge Summary  Diamond [name] UJW:[id number] DOB: 06-14-1940 DOA: 10/13/2017  PCP: System, Pcp Not In  Admit date: 10/13/2017 Discharge date: 10/18/2017  Admitted From: Home Disposition:  Home  Discharge Condition:Stable CODE STATUS:FULL Diet recommendation: Heart Healthy   Brief/Interim Summary: Patient is a 77 year old female with past medical history of hyperlipidemia, hypertension, seizures, strokes, coronary artery disease, status post CABG who was sent from [facility] after she presented there with complaints of dizziness.  It was associated with nausea and vomiting.  She was noted to be hypertensive on presentation.  MRI/MRI brain obtained at [facility] showed occlusion of the left  vertebral artery and acute left posterior inferior cerebellar infarct.  Also noted to be in new onset A. fib with RVR. She was transferred here. Neurology was following.  Patient was started on anticoagulation with heparin drip for A. 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 Patient was evaluated by physical therapy and recommended skilled nursing facility on discharge.  Following problems were addressed during her hospitalization:  Acute ischemic stroke: MRA/MRI brain obtained at [facility] showed occlusion of the left  vertebral artery and acute left posterior inferior cerebellar infarct.  Neurology is following.  Echocardiogram showed EF of  65%-70%,no wall motion abnormalities.   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  Underwent cerebral angiogram . She was on  Plavix and aspirin at home. Started on Eliquis and plavix discontinued.Continue Aspirin. Continue statin.  Patient has history of 3 strokes in the  past.  She has residual weakness on the right side.  Hemoglobin A1c of 6.2. PT/OT recommending CIR  but she was not a candidate for CIR and was discharged to skilled nursing facility.  Vertigo/ataxia: Secondary to stroke in the posterior circulation.The symptoms have significantly improved.    New onset A. fib: Currently rate is controlled. She is on carvedilol at home which will continue .  She underwent echocardiogram which showed moderate concentric left ventricular hypertrophy, ejection fraction of 65 to 70%, no regional wall motion abnormalities.  She was started on anti-coagulation with heparin drip.  Anti-coagulation has been changed to Eliquis . Follow up with her own cardiologist on discharge.  Respiratory insufficiency: Currently she is on supplemental oxygen.We will try to wean her off oxygen.  Chest x-ray showed interstitial edema suggesting of CHF but echocardiogram shows normal ejection fraction, no diastolic abnormality.  Fluids have been discontinued.  Given a dose of Lasix earlier for  bibasilar crackles with significant improvement.  History of coronary artery disease: Status post CABG in 2005.  Was on Plavix and aspirin at home. Plavix D/Ced.  She follows with cardiology Dr. Tomie China.  Continue statin.  HTN: Currently she is normotensive.She was on carvedilol and amlodipine at home.  Dose of carvedilol has been decreased.  Amlodipine has been stopped.  Hyperlipidemia:Resume Lipitor 80 mg daily.  Fall/sacral pain: X-ray did not show any fracture dislocation.  Hypokalemia: Supplemented.  Hyponatremia: This is a chronic issue with her.  Check BMP in a week.  Headache: Complained of headache on the occipital region secondary to fall.Much improved now.  Urinary tract infection: Patient complained of dysuria on presentation.  Urine culture grew E. coli.  Started on nitrofurantoin.  Urinary Retention: Foley has been placed.  She needs to follow-up with  urology as an  outpatient in a week.  Name and number of the provider group has been attached.   Discharge Diagnoses:  Principal Problem:   Acute ischemic stroke [facility]) Active Problems:   Coronary artery disease involving native coronary artery of native heart without angina pectoris   Dyslipidemia   Essential hypertension   History of stroke   Hx of CABG   Hyperlipidemia   Vertebral artery disease [facility])    Discharge Instructions  Discharge Instructions    Ambulatory referral to Neurology   Complete by:  As directed    An appointment is requested in approximately: 4 weeks.   Diet - low sodium heart healthy   Complete by:  As directed    Discharge instructions   Complete by:  As directed    1) Take prescribed medications as instructed. 2) Do a CBC and BMP test in a week. 3) Follow up with neurology as an outpatient in 4 weeks.  Name and number of the provider has been attached. 4) Follow up with your cardiologist in 4 weeks. 5) Follow up with urology as an outpatient in a week .   Increase activity slowly   Complete by:  As directed      Allergies as of 10/18/2017      Reactions   Contrast Media [iodinated Diagnostic Agents]    Codeine Other (See Comments)      Medication List    STOP taking these medications   amLODipine 5 MG tablet Commonly known as:  NORVASC   clopidogrel 75 MG tablet Commonly known as:  PLAVIX     TAKE these medications   acetaminophen 500 MG tablet Commonly known as:  TYLENOL Take 1,000 mg by mouth as needed for mild pain or headache.   alendronate 70 MG tablet Commonly known as:  FOSAMAX Take 70 mg by mouth once a week.   ALPRAZolam 0.5 MG tablet Commonly known as:  XANAX Take 1 tablet (0.5 mg total) by mouth every 6 (six) hours as needed for anxiety.   apixaban 5 MG Tabs tablet Commonly known as:  ELIQUIS Take 1 tablet (5 mg total) by mouth 2 (two) times daily.   aspirin EC 81 MG tablet Take 81 mg by mouth daily.   atorvastatin 80 MG  tablet Commonly known as:  LIPITOR Take 80 mg by mouth daily.   carvedilol 3.125 MG tablet Commonly known as:  COREG Take 1 tablet (3.125 mg total) by mouth 2 (two) times daily. What changed:    medication strength  how much to take   hydroxypropyl methylcellulose / hypromellose 2.5 % ophthalmic solution Commonly known as:  ISOPTO TEARS / GONIOVISC Place 1 drop into both eyes as needed for dry eyes.   nitrofurantoin (macrocrystal-monohydrate) 100 MG capsule Commonly known as:  MACROBID Take 1 capsule (100 mg total) by mouth every 12 (twelve) hours. Notes to patient:  Last given at 1100am   ranitidine 150 MG tablet Commonly known as:  ZANTAC Take 150 mg by mouth 2 (two) times daily.      Follow-up Information    Julieanne Cotton, MD Follow up in 2 week(s).   Specialties:  Interventional Radiology, Radiology Why:  Please follow-up with Dr. Corliss Skains in clinic 2-3 weeks after discharge. Contact information: [address] [place] [address] [phone]        Micki Riley, MD. Schedule an appointment as soon as possible for a visit in 4 week(s).   Specialties:  Neurology, Radiology Contact information:  [address] [place] [address] [phone]        [facility]. Schedule an appointment as soon as possible for a visit in 1 week(s).   Contact information: [address] [place] [id number] [phone]         Allergies  Allergen Reactions  . Contrast Media [Iodinated Diagnostic Agents]   . Codeine Other (See Comments)    Consultations:  Neurology   Procedures/Studies: Dg Chest 2 View  Result Date: 10/15/2017 CLINICAL DATA:  Encounter for oxygen desaturation EXAM: CHEST - 2 VIEW COMPARISON:  10/13/2017 FINDINGS: Mild interstitial edema or infiltrates, more conspicuous than on prior exam, involving bases more than apices. Heart size upper limits normal. Previous median sternotomy and  CABG. Aortic Atherosclerosis (ICD10-170.0). Small left pleural effusion. Old right fourth rib fracture deformity. IMPRESSION: 1. Mild interstitial edema/infiltrates with cardiomegaly and small effusion suggesting CHF. Electronically Signed   By: Corlis Leak  Hassell M.D.   On: 10/15/2017 14:40   Dg Sacrum/coccyx  Result Date: 10/13/2017 CLINICAL DATA:  Fall onto tailbone EXAM: SACRUM AND COCCYX - 2+ VIEW COMPARISON:  None. FINDINGS: There is no evidence of fracture or other focal bone lesions. IMPRESSION: Negative. Electronically Signed   By: Deatra RobinsonKevin  Herman M.D.   On: 10/13/2017 22:55   Ct Head Wo Contrast  Result Date: 10/14/2017 CLINICAL DATA:  77 y/o  F; follow-up of stroke. EXAM: CT HEAD WITHOUT CONTRAST TECHNIQUE: Contiguous axial images were obtained from the base of the skull through the vertex without intravenous contrast. COMPARISON:  10/13/2017 MRI of the head. FINDINGS: Brain: Stable chronic left perisylvian infarct. Stable distribution of left inferior cerebellum acute infarction without associated mass effect or hemorrhage. No new stroke, hemorrhage, extra-axial collection, hydrocephalus, or herniation. Stable chronic microvascular ischemic changes and volume loss of the brain. Vascular: Calcific atherosclerosis of the carotid siphons and vertebral arteries. Skull: Normal. Negative for fracture or focal lesion. Sinuses/Orbits: No acute finding. Other: None. IMPRESSION: 1. Stable distribution of left inferior cerebellum acute infarction. No associated mass effect or hemorrhage. 2. No new acute intracranial abnormality identified. 3. Stable background of chronic microvascular ischemic changes, volume loss, and chronic infarcts of the brain. Electronically Signed   By: Mitzi HansenLance  Furusawa-Stratton M.D.   On: 10/14/2017 01:14      Subjective: Patient seen and examined the bedside this morning.  Remains comfortable.  No new issues/events.  Stable for discharge to skilled nursing facility today.  Discharge  Exam: Vitals:   10/18/17 0423 10/18/17 0813  BP: 114/65   Pulse: 90   Resp: 12   Temp:  98.6 F (37 C)  SpO2: 97%    Vitals:   10/17/17 2100 10/18/17 0023 10/18/17 0423 10/18/17 0813  BP: 134/71 (!) 94/54 114/65   Pulse:  (!) 54 90   Resp:  11 12   Temp: 98.6 F (37 C)   98.6 F (37 C)  TempSrc: Oral     SpO2:  95% 97%   Weight:      Height:        General: Pt is alert, awake, not in acute distress Cardiovascular: RRR, S1/S2 +, no rubs, no gallops Respiratory: CTA bilaterally, no wheezing, no rhonchi Abdominal: Soft, NT, ND, bowel sounds + Extremities: no edema, no cyanosis    The results of significant diagnostics from this hospitalization (including imaging, microbiology, ancillary and laboratory) are listed below for reference.     Microbiology: Recent Results (from the past 240 hour(s))  Culture,  Urine     Status: Abnormal   Collection Time: 10/15/17  6:00 PM  Result Value Ref Range Status   Specimen Description URINE, CATHETERIZED  Final   Special Requests   Final    NONE Performed at [facility], [address], [place], [place] [address]    Culture >=100,000 COLONIES/mL ESCHERICHIA COLI (A)  Final   Report Status 10/17/2017 FINAL  Final   Organism ID, Bacteria ESCHERICHIA COLI (A)  Final      Susceptibility   Escherichia coli - MIC*    AMPICILLIN >=32 RESISTANT Resistant     CEFAZOLIN 8 SENSITIVE Sensitive     CEFTRIAXONE <=1 SENSITIVE Sensitive     CIPROFLOXACIN <=0.25 SENSITIVE Sensitive     GENTAMICIN <=1 SENSITIVE Sensitive     IMIPENEM <=0.25 SENSITIVE Sensitive     NITROFURANTOIN <=16 SENSITIVE Sensitive     TRIMETH/SULFA <=20 SENSITIVE Sensitive     AMPICILLIN/SULBACTAM >=32 RESISTANT Resistant     PIP/TAZO <=4 SENSITIVE Sensitive     Extended ESBL NEGATIVE Sensitive     * >=100,000 COLONIES/mL ESCHERICHIA COLI     Labs: BNP (last 3 results) No results for input(s): BNP in the last 8760 hours. Basic Metabolic Panel: Recent Labs   Lab 10/13/17 1629 10/14/17 0906 10/15/17 0337 10/17/17 0423 10/18/17 0341  NA 135  --  131* 127* 129*  K 3.4* 3.7 3.6 3.4* 4.3  CL 97*  --  92* 85* 89*  CO2 27  --  31 33* 33*  GLUCOSE 95  --  118* 128* 139*  BUN 9  --  8 9 18   CREATININE 0.63  --  0.63 0.52 0.63  CALCIUM 8.8*  --  8.5* 8.4* 8.8*   Liver Function Tests: Recent Labs  Lab 10/13/17 1629  AST 26  ALT 18  ALKPHOS 64  BILITOT 0.9  PROT 6.7  ALBUMIN 3.6   No results for input(s): LIPASE, AMYLASE in the last 168 hours. No results for input(s): AMMONIA in the last 168 hours. CBC: Recent Labs  Lab 10/14/17 0230 10/15/17 0337 10/16/17 0245 10/17/17 0423  WBC 10.6* 8.6 9.3 9.2  HGB 11.5* 11.5* 10.4* 11.4*  HCT 35.1* 35.5* 32.2* 33.5*  MCV 90.7 91.0 90.4 87.0  PLT 289 265 248 294   Cardiac Enzymes: No results for input(s): CKTOTAL, CKMB, CKMBINDEX, TROPONINI in the last 168 hours. BNP: Invalid input(s): POCBNP CBG: Recent Labs  Lab 10/15/17 1630  GLUCAP 104*   D-Dimer No results for input(s): DDIMER in the last 72 hours. Hgb A1c No results for input(s): HGBA1C in the last 72 hours. Lipid Profile No results for input(s): CHOL, HDL, LDLCALC, TRIG, CHOLHDL, LDLDIRECT in the last 72 hours. Thyroid function studies No results for input(s): TSH, T4TOTAL, T3FREE, THYROIDAB in the last 72 hours.  Invalid input(s): FREET3 Anemia work up No results for input(s): VITAMINB12, FOLATE, FERRITIN, TIBC, IRON, RETICCTPCT in the last 72 hours. Urinalysis    Component Value Date/Time   COLORURINE YELLOW 10/15/2017 1800   APPEARANCEUR HAZY (A) 10/15/2017 1800   LABSPEC 1.014 10/15/2017 1800   PHURINE 6.0 10/15/2017 1800   GLUCOSEU NEGATIVE 10/15/2017 1800   HGBUR NEGATIVE 10/15/2017 1800   BILIRUBINUR NEGATIVE 10/15/2017 1800   KETONESUR NEGATIVE 10/15/2017 1800   PROTEINUR NEGATIVE 10/15/2017 1800   NITRITE NEGATIVE 10/15/2017 1800   LEUKOCYTESUR NEGATIVE 10/15/2017 1800   Sepsis Labs Invalid  input(s): PROCALCITONIN,  WBC,  LACTICIDVEN Microbiology Recent Results (from the past 240 hour(s))  Culture, Urine  Status: Abnormal   Collection Time: 10/15/17  6:00 PM  Result Value Ref Range Status   Specimen Description URINE, CATHETERIZED  Final   Special Requests   Final    NONE Performed at [facility], [address], [place], [place] [address]    Culture >=100,000 COLONIES/mL ESCHERICHIA COLI (A)  Final   Report Status 10/17/2017 FINAL  Final   Organism ID, Bacteria ESCHERICHIA COLI (A)  Final      Susceptibility   Escherichia coli - MIC*    AMPICILLIN >=32 RESISTANT Resistant     CEFAZOLIN 8 SENSITIVE Sensitive     CEFTRIAXONE <=1 SENSITIVE Sensitive     CIPROFLOXACIN <=0.25 SENSITIVE Sensitive     GENTAMICIN <=1 SENSITIVE Sensitive     IMIPENEM <=0.25 SENSITIVE Sensitive     NITROFURANTOIN <=16 SENSITIVE Sensitive     TRIMETH/SULFA <=20 SENSITIVE Sensitive     AMPICILLIN/SULBACTAM >=32 RESISTANT Resistant     PIP/TAZO <=4 SENSITIVE Sensitive     Extended ESBL NEGATIVE Sensitive     * >=100,000 COLONIES/mL ESCHERICHIA COLI    Please note: You were cared for by a hospitalist during your hospital stay. Once you are discharged, your primary care physician will handle any further medical issues. Please note that NO REFILLS for any discharge medications will be authorized once you are discharged, as it is imperative that you return to your primary care physician (or establish a relationship with a primary care physician if you do not have one) for your post hospital discharge needs so that they can reassess your need for medications and monitor your lab values.    Time coordinating discharge: 40 minutes  SIGNED:   Burnadette Pop, MD  [facility] 10/18/2017, 12:22 PM Pager [phone]  If 7PM-7AM, please contact night-coverage [email or web address] Password [email or web address]

## 2017-10-18 NOTE — Progress Notes (Signed)
Nurse called report and spoke with Raynelle FanningJulie

## 2017-10-18 NOTE — Care Management Important Message (Signed)
Important Message  Patient Details  Name: Diamond [name] MRN: [id number] Date of Birth: 29-Jul-1940   Medicare Important Message Given:  Yes    [name] 10/18/2017, 9:16 AM

## 2017-10-18 NOTE — Progress Notes (Signed)
Inpatient Rehabilitation-Admissions Coordinator   After clarifying with pt and her daughter this morning, pt does not have the recommended 24/7 Supervision available at home after a CIR stay. Based on lack of assistance available to her, as she lives alone, pt is not a candidate for CIR. AC would recommends SNF placement for rehab. AC has communicated with SW/CM on floor regarding new dispo needs. Please call if questions.   Nanine MeansKelly Kenzel Ruesch, OTR/L  Rehab Admissions Coordinator  [phone] 10/18/2017 11:19 AM

## 2017-10-18 NOTE — Progress Notes (Signed)
Patient discharging [facility] [place] [address] [place]. All belonging sent with patient/[facility]. Nurse will call report.

## 2017-10-18 NOTE — [place] FL2 (Signed)
[place] MEDICAID FL2 LEVEL OF CARE SCREENING TOOL     IDENTIFICATION  Patient Name: Diamond [name] Birthdate: 11-21-1940 Sex: female Admission Date (Current Location): 10/13/2017  [facility] [place]  [employer]   Facility and Address:  [facility], [address], [place], [place] [address]      Provider Number: [id number]  Attending Physician Name and Address:  Burnadette PopAdhikari, Amrit, MD  Relative Name and Phone Number:       Current Level of Care: Hospital Recommended Level of Care: Skilled Nursing Facility Prior Approval Number:    Date Approved/Denied:   PASRR Number: [id number]  Discharge Plan: SNF    Current Diagnoses: Patient Active Problem List   Diagnosis Date Noted  . Acute ischemic stroke (HCC) 10/14/2017  . Vertebral artery disease (HCC) 10/13/2017  . Hyperlipidemia 01/03/2017  . Coronary artery disease involving native coronary artery of native heart without angina pectoris 10/30/2014  . Dyslipidemia 10/30/2014  . Essential hypertension 10/30/2014  . History of stroke 10/30/2014  . Hx of CABG 10/30/2014    Orientation RESPIRATION BLADDER Height & Weight     Self, Time, Situation, Place  Normal Continent Weight: 160 lb (72.6 kg) Height:  5\' 1"  (154.9 cm)  BEHAVIORAL SYMPTOMS/MOOD NEUROLOGICAL BOWEL NUTRITION STATUS      Continent Diet(regular)  AMBULATORY STATUS COMMUNICATION OF NEEDS Skin   Limited Assist Verbally Skin abrasions                       Personal Care Assistance Level of Assistance  Bathing, Feeding, Dressing Bathing Assistance: Limited assistance Feeding assistance: Independent Dressing Assistance: Limited assistance     Functional Limitations Info  Sight, Speech, Hearing Sight Info: Impaired Hearing Info: Adequate Speech Info: Adequate    SPECIAL CARE FACTORS FREQUENCY  PT (By licensed PT), OT (By licensed OT)     PT Frequency: 5x/wk OT Frequency: 5x/wk            Contractures  Contractures Info: Not present    Additional Factors Info  Code Status, Allergies Code Status Info: Full Allergies Info: Contrast Media Iodinated Diagnostic Agents, Codeine           Current Medications (10/18/2017):  This is the current hospital active medication list Current Facility-Administered Medications  Medication Dose Route Frequency Provider Last Rate Last Dose  . acetaminophen (TYLENOL) tablet 650 mg  650 mg Oral Q4H PRN Alessandra BevelsKamineni, Neelima, MD   650 mg at 10/15/17 2220   Or  . acetaminophen (TYLENOL) solution 650 mg  650 mg Per Tube Q4H PRN Alessandra BevelsKamineni, Neelima, MD       Or  . acetaminophen (TYLENOL) suppository 650 mg  650 mg Rectal Q4H PRN Alessandra BevelsKamineni, Neelima, MD      . ALPRAZolam Prudy Feeler(XANAX) tablet 0.5 mg  0.5 mg Oral Q6H PRN Burnadette PopAdhikari, Amrit, MD      . aspirin EC tablet 81 mg  81 mg Oral Daily Alessandra BevelsKamineni, Neelima, MD   81 mg at 10/17/17 1057  . atorvastatin (LIPITOR) tablet 80 mg  80 mg Oral q1800 Alessandra BevelsKamineni, Neelima, MD   80 mg at 10/17/17 1811  . carvedilol (COREG) tablet 3.125 mg  3.125 mg Oral BID Burnadette PopAdhikari, Amrit, MD   3.125 mg at 10/17/17 2148  . famotidine (PEPCID) tablet 20 mg  20 mg Oral Daily Burnadette PopAdhikari, Amrit, MD   20 mg at 10/17/17 1057  . fentaNYL (SUBLIMAZE) injection 12.5 mcg  12.5 mcg Intravenous Q6H PRN Alessandra BevelsKamineni, Neelima, MD  12.5 mcg at 10/16/17 0410  . hydrALAZINE (APRESOLINE) tablet 25 mg  25 mg Oral Q8H PRN Alessandra Bevels, MD      . meclizine (ANTIVERT) tablet 25 mg  25 mg Oral BID PRN Alessandra Bevels, MD   25 mg at 10/16/17 2312  . nitrofurantoin (macrocrystal-monohydrate) (MACROBID) capsule 100 mg  100 mg Oral Q12H Burnadette Pop, MD   100 mg at 10/17/17 2148  . ondansetron (ZOFRAN) injection 4 mg  4 mg Intravenous Q6H PRN Alessandra Bevels, MD   4 mg at 10/16/17 2312  . senna-docusate (Senokot-S) tablet 1 tablet  1 tablet Oral QHS PRN Kamineni, Neelima, MD      . sodium chloride tablet 1 g  1 g Oral BID WC Adhikari, Amrit, MD      . traMADol (ULTRAM) tablet  50 mg  50 mg Oral Q6H PRN Alessandra Bevels, MD   50 mg at 10/17/17 1056     Discharge Medications: Please see discharge summary for a list of discharge medications.  Relevant Imaging Results:  Relevant Lab Results:   Additional Information SS#: [id number]  Baldemar Lenis, LCSW

## 2017-10-18 NOTE — Progress Notes (Signed)
Physical Therapy Treatment Patient Details Name: Diamond [name] B Wakeley MRN: [id number] DOB: [birth year] Today's Date: 10/18/2017    History of Present Illness Patient is a 77 y/o female presenting with dizziness and nausea, also with fall to buttocks within the home. 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    PT Comments    Ms. Adriana SimasCook doing well today. Able to progress mobility for short distances in room with dizziness limiting patient. Reports dizziness with positional changes but with no nystagmus noted. Requires light Min A to min guard for safety with mobility, but does request assist from nursing due to fear of falling. Updated PT recommendations. Making good progress towards goals.     Follow Up Recommendations  SNF     Equipment Recommendations  (TBD at next venue of care)    Recommendations for Other Services       Precautions / Restrictions Precautions Precautions: Fall Precaution Comments:  nystagmus Restrictions Weight Bearing Restrictions: No    Mobility  Bed Mobility Overal bed mobility: Needs Assistance Bed Mobility: Supine to Sit     Supine to sit: Min assist;Min guard     General bed mobility comments: Min A for LE management and trunk control  Transfers Overall transfer level: Needs assistance Equipment used: Rolling walker (2 wheeled) Transfers: Sit to/from [employer]Stand;Stand Pivot Transfers Sit to Stand: Min guard Stand pivot transfers: Min guard       General transfer comment: very light min A - essentially Min guard; patient reporting dizziness with all positional changes  Ambulation/Gait Ambulation/Gait assistance: Min assist;Min guard;+2 safety/equipment Gait Distance (Feet): 20 Feet Assistive device: Rolling walker (2  wheeled) Gait Pattern/deviations: Step-to pattern;Step-through pattern;Decreased stride length;Trunk flexed Gait velocity: decreased   General Gait Details: very cautious with gait; requesting asssit from nursing, but did not require; verbal cueing for safety and obstacle navigation   Stairs             Wheelchair Mobility    Modified Rankin (Stroke Patients Only)       Balance Overall balance assessment: Needs assistance Sitting-balance support: Bilateral upper extremity supported;Feet supported Sitting balance-Leahy Scale: Fair     Standing balance support: Bilateral upper extremity supported;During functional activity Standing balance-Leahy Scale: Poor Standing balance comment: does rely on RW for support due to dizziness; but with min guard to very light Min A for balance with mobility                            Cognition Arousal/Alertness: Awake/alert Behavior During Therapy: Flat affect;Anxious Overall Cognitive Status: Within Functional Limits for tasks assessed                                 General Comments: very talkative; requires verbal cueing to stay on task      Exercises      General Comments        Pertinent Vitals/Pain Pain Assessment: Faces Pain Score: 6  Faces Pain Scale: Hurts little more Pain Location: buttocks Pain Descriptors / Indicators: Discomfort Pain Intervention(s): Limited activity within patient's tolerance;Monitored during session;Repositioned    Home Living Family/patient expects to be discharged to:: Private residence Living Arrangements: Alone Available Help at Discharge: Available PRN/intermittently  Prior Function            PT Goals (current goals can now be found in the care plan section) Acute Rehab PT Goals Patient Stated Goal: To do for herself again PT Goal Formulation: With patient Time For Goal Achievement: 10/28/17 Potential to Achieve Goals:  Good Progress towards PT goals: Progressing toward goals    Frequency    Min 4X/week      PT Plan Current plan remains appropriate    Co-evaluation              AM-PAC PT "6 Clicks" Daily Activity  Outcome Measure  Difficulty turning over in bed (including adjusting bedclothes, sheets and blankets)?: A Little Difficulty moving from lying on back to sitting on the side of the bed? : Unable Difficulty sitting down on and standing up from a chair with arms (e.g., wheelchair, bedside commode, etc,.)?: Unable Help needed moving to and from a bed to chair (including a wheelchair)?: A Little Help needed walking in hospital room?: A Little Help needed climbing 3-5 steps with a railing? : A Lot 6 Click Score: 13    End of Session Equipment Utilized During Treatment: Gait belt Activity Tolerance: Patient tolerated treatment well Patient left: in chair;with call bell/phone within reach;with chair alarm set Nurse Communication: Mobility status PT Visit Diagnosis: Unsteadiness on feet (R26.81);Other abnormalities of gait and mobility (R26.89);Muscle weakness (generalized) (M62.81)     Time: 0981-1914 PT Time Calculation (min) (ACUTE ONLY): 19 min  Charges:  $Gait Training: 8-22 mins                    G Codes:       Kipp Laurence, PT, DPT 10/18/17 2:06 PM Pager: [phone]

## 2017-10-18 NOTE — Progress Notes (Signed)
Occupational Therapy Treatment Patient Details Name: MARGARITE VESSEL MRN: [id number] DOB: October 15, 1940 Today's Date: 10/18/2017    History of present illness Patient is a 77 y/o female presenting with dizziness and nausea, also with fall to buttocks within the home. 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   OT comments  Pt limited by pain in buttocks, dizziness, N/V this session. Pt required multimodal cues for safety during ADL mobility and redirecting multiple times. Pt unable to go to CIR after acute stay and will d/c to [facility] in [place] doe further rehab. OT will continue to follow acutely  Follow Up Recommendations  SNF    Equipment Recommendations  Other (comment)(TBD at SNF)    Recommendations for Other Services      Precautions / Restrictions Precautions Precautions: Fall Precaution Comments:  nystagmus Restrictions Weight Bearing Restrictions: No       Mobility Bed Mobility               General bed mobility comments: pt up in recliner upon arrival  Transfers Overall transfer level: Needs assistance Equipment used: Rolling walker (2 wheeled);1 person hand held assist Transfers: Sit to/from [facility] Sit to Stand: Min assist Stand pivot transfers: Min assist       General transfer comment: pt reports dizziness and nausea    Balance Overall balance assessment: Needs assistance Sitting-balance support: Bilateral upper extremity supported;Feet supported Sitting balance-Leahy Scale: Fair     Standing balance support: Bilateral upper extremity supported;During functional activity Standing balance-Leahy Scale: Poor                             ADL either performed or assessed with clinical  judgement   ADL Overall ADL's : Needs assistance/impaired     Grooming: Set up;Wash/dry hands;Wash/dry face;Sitting                   Toilet Transfer: Stand-pivot;Minimal assistance;Ambulation;RW   Toileting- Clothing Manipulation and Hygiene: Sit to/from stand;Minimal assistance         General ADL Comments: ptc/o nauseau while sitting on BSC, began to to vomit and pt returned to recliner with once nausea subsided     Vision Baseline Vision/History: Wears glasses Wears Glasses: At all times Patient Visual Report: No change from baseline     Perception     Praxis      Cognition Arousal/Alertness: Awake/alert Behavior During Therapy: Anxious Overall Cognitive Status: Within Functional Limits for tasks assessed                                 General Comments: required cues for safety and to redirect        Exercises     Shoulder Instructions       General Comments      Pertinent Vitals/ Pain       Pain Assessment: 0-10 Pain Score: 6  Pain Location: buttocks Pain Descriptors / Indicators: Sore;Discomfort Pain Intervention(s): Limited activity within patient's tolerance;Monitored during session;Repositioned  Home Living Family/patient expects to be discharged to:: Private residence Living Arrangements: Alone Available Help at Discharge: Available PRN/intermittently  Prior Functioning/Environment              Frequency  Min 2X/week        Progress Toward Goals  OT Goals(current goals can now be found in the care plan section)  Progress towards OT goals: OT to reassess next treatment     Plan Discharge plan needs to be updated    Co-evaluation                 AM-PAC PT "6 Clicks" Daily Activity     Outcome Measure   Help from another person eating meals?: None Help from another person taking care of personal grooming?: A Little Help from another person  toileting, which includes using toliet, bedpan, or urinal?: A Lot Help from another person bathing (including washing, rinsing, drying)?: A Lot Help from another person to put on and taking off regular upper body clothing?: A Little Help from another person to put on and taking off regular lower body clothing?: A Lot 6 Click Score: 16    End of Session    OT Visit Diagnosis: Unsteadiness on feet (R26.81);Muscle weakness (generalized) (M62.81);Dizziness and giddiness (R42)   Activity Tolerance Other (comment)(limited due to dozziness, N/V)   Patient Left with call bell/phone within reach;in chair   Nurse Communication      Functional Assessment Tool Used: AM-PAC 6 Clicks Daily Activity   Time: 8333-8329 OT Time Calculation (min): 25 min  Charges: OT G-codes **NOT FOR INPATIENT CLASS** Functional Assessment Tool Used: AM-PAC 6 Clicks Daily Activity OT General Charges $OT Visit: 1 Visit OT Treatments $Self Care/Home Management : 8-22 mins $Therapeutic Activity: 8-22 mins     Britt Bottom 10/18/2017, 1:34 PM

## 2017-10-18 NOTE — Clinical Social Work Placement (Signed)
Nurse to call report to [phone], Room 607     CLINICAL SOCIAL WORK PLACEMENT  NOTE  Date:  10/18/2017  Patient Details  Name: Diamond [name] MRN: [id number] Date of Birth: January 13, 1941  Clinical Social Work is seeking post-discharge placement for this patient at the Skilled  Nursing Facility level of care (*CSW will initial, date and re-position this form in  chart as items are completed):  Yes   Patient/family provided with [facility] Clinical Social Work Department's list of facilities offering this level of care within the geographic area requested by the patient (or if unable, by the patient's family).  Yes   Patient/family informed of their freedom to choose among providers that offer the needed level of care, that participate in Medicare, Medicaid or managed care program needed by the patient, have an available bed and are willing to accept the patient.  Yes   Patient/family informed of [facility] ownership interest in [facility] and [facility], as well as of the fact that they are under no obligation to receive care at these facilities.  PASRR submitted to EDS on 10/18/17     PASRR number received on 10/18/17     Existing PASRR number confirmed on       FL2 transmitted to all facilities in geographic area requested by pt/family on 10/18/17     FL2 transmitted to all facilities within larger geographic area on       Patient informed that his/her managed care company has contracts with or will negotiate with certain facilities, including the following:        Yes   Patient/family informed of bed offers received.  [facility]     Physician recommends and patient chooses bed at      Patient to be transferred to [facility], [place] on 10/18/17.  Patient to be transferred to facility by [facility]     Patient family notified on 10/18/17 of transfer.  Name of family member notified:  Olegario MessierKathy     PHYSICIAN       Additional Comment:     _______________________________________________ Baldemar LenisElizabeth M Merikay Lesniewski, LCSW 10/18/2017, 12:31 PM

## 2017-10-18 NOTE — Consult Note (Signed)
[facility] CM Primary Care Navigator  10/18/2017  Diamond [name] [birth year] [id number]   Met withpatientat the bedside to identify possible discharge needs. RN is getting ready for patient to discharge to skilled nursing facility (foley catheter reinserted r/t urinary retention) and transport staff in the room.   Per chart review, [facility]was [facility] after she presented there with complaints of dizziness and was noted to be hypertensive on presentation. MRI of brain obtained at [facility] showed occlusion of the left vertebral artery and acute left posterior inferior cerebellar infarct. Patient was [facility] for further evaluation and treatment. (acute ischemic stroke, new onset atrial fibrillation, UTI)  Patient endorses Dr. Cher Nakai with [facility] ast[facility]primary care provider.   [place].  Patientreports managing herown medications at home straight out of the containers.  Patient reports that she has been driving prior to admission and bringing self to her doctors'appointments.  Patient states that she lives alone, has been physically active and independent with self care at home prior to admission.   Anticipated discharge plan is skilled nursing facility (SNF) for rehabilitation per therapy recommendation.   Patientvoiced understanding to call primary care provider's officewhenshewill returnback home,for a post discharge follow-upvisitwithin1- 2weeksor sooner if needed.Patient letter (with PCP's contact number) was provided as a reminder and was given to [facility] transport staff to bring along.  Explained to patient regarding [facility] CM services available for healthmanagementand resourcesat homebut she denies any current needs or concerns at this point. Patient verbalizedunderstandingof needto seekreferral  from primary care provider to [facility] care management ifdeemed necessary and appropriatefor anyservicesin the nearfuture- once dischargehome.   [facility] care management information provided for future needs that patient may have.   Primary care provider's office is listed as providing transition of care (TOC) follow-up.   For additional questions please contact:  Edwena Felty A. Bodi Palmeri, BSN, RN-BC [facility] PRIMARY CARE Navigator Cell: [phone]

## 2017-10-18 NOTE — Care Management Note (Signed)
Case Management Note  Patient Details  Name: Diamond [name] B Flaum MRN: [id number] Date of Birth: 11-15-1940  Subjective/Objective:                    Action/Plan: Pt discharging to [facility] [place]. CM signing off.   Expected Discharge Date:  10/18/17               Expected Discharge Plan:  Skilled Nursing Facility  In-House Referral:  Clinical Social Work  Discharge planning Services     Post Acute Care Choice:    Choice offered to:     DME Arranged:    DME Agency:     HH Arranged:    HH Agency:     Status of Service:  Completed, signed off  If discussed at [employer] Meetings, dates discussed:    Additional Comments:  Kermit BaloKelli F Brodin Gelpi, RN 10/18/2017, 12:58 PM

## 2017-10-19 DIAGNOSIS — I4891 Unspecified atrial fibrillation: Secondary | ICD-10-CM | POA: Diagnosis not present

## 2017-10-19 DIAGNOSIS — E785 Hyperlipidemia, unspecified: Secondary | ICD-10-CM | POA: Diagnosis not present

## 2017-10-19 DIAGNOSIS — I639 Cerebral infarction, unspecified: Secondary | ICD-10-CM | POA: Diagnosis not present

## 2017-10-19 DIAGNOSIS — R262 Difficulty in walking, not elsewhere classified: Secondary | ICD-10-CM | POA: Diagnosis not present

## 2017-10-20 ENCOUNTER — Other Ambulatory Visit (HOSPITAL_COMMUNITY): Payer: Self-pay | Admitting: Interventional Radiology

## 2017-10-20 DIAGNOSIS — G458 Other transient cerebral ischemic attacks and related syndromes: Secondary | ICD-10-CM

## 2017-10-24 ENCOUNTER — Encounter (HOSPITAL_COMMUNITY): Payer: Self-pay | Admitting: Interventional Radiology

## 2017-10-25 DIAGNOSIS — T8189XD Other complications of procedures, not elsewhere classified, subsequent encounter: Secondary | ICD-10-CM | POA: Diagnosis not present

## 2017-10-25 DIAGNOSIS — T8189XA Other complications of procedures, not elsewhere classified, initial encounter: Secondary | ICD-10-CM | POA: Diagnosis not present

## 2017-10-26 DIAGNOSIS — R338 Other retention of urine: Secondary | ICD-10-CM | POA: Diagnosis not present

## 2017-11-01 DIAGNOSIS — T8189XD Other complications of procedures, not elsewhere classified, subsequent encounter: Secondary | ICD-10-CM | POA: Diagnosis not present

## 2017-11-01 DIAGNOSIS — T8189XA Other complications of procedures, not elsewhere classified, initial encounter: Secondary | ICD-10-CM | POA: Diagnosis not present

## 2017-11-08 DIAGNOSIS — T8189XD Other complications of procedures, not elsewhere classified, subsequent encounter: Secondary | ICD-10-CM | POA: Diagnosis not present

## 2017-11-08 DIAGNOSIS — T8189XA Other complications of procedures, not elsewhere classified, initial encounter: Secondary | ICD-10-CM | POA: Diagnosis not present

## 2017-11-15 DIAGNOSIS — T8189XA Other complications of procedures, not elsewhere classified, initial encounter: Secondary | ICD-10-CM | POA: Diagnosis not present

## 2017-11-15 DIAGNOSIS — T8189XD Other complications of procedures, not elsewhere classified, subsequent encounter: Secondary | ICD-10-CM | POA: Diagnosis not present

## 2017-11-17 ENCOUNTER — Ambulatory Visit (INDEPENDENT_AMBULATORY_CARE_PROVIDER_SITE_OTHER): Payer: Medicare Other | Admitting: Adult Health

## 2017-11-17 ENCOUNTER — Encounter: Payer: Self-pay | Admitting: Adult Health

## 2017-11-17 VITALS — BP 122/76 | HR 68 | Ht 61.0 in | Wt 159.0 lb

## 2017-11-17 DIAGNOSIS — E785 Hyperlipidemia, unspecified: Secondary | ICD-10-CM

## 2017-11-17 DIAGNOSIS — I10 Essential (primary) hypertension: Secondary | ICD-10-CM | POA: Diagnosis not present

## 2017-11-17 DIAGNOSIS — I63532 Cerebral infarction due to unspecified occlusion or stenosis of left posterior cerebral artery: Secondary | ICD-10-CM | POA: Diagnosis not present

## 2017-11-17 DIAGNOSIS — I480 Paroxysmal atrial fibrillation: Secondary | ICD-10-CM

## 2017-11-17 DIAGNOSIS — G458 Other transient cerebral ischemic attacks and related syndromes: Secondary | ICD-10-CM | POA: Diagnosis not present

## 2017-11-17 NOTE — Patient Instructions (Addendum)
Continue aspirin 81 mg daily and Eliquis (apixaban) daily  and lipitor  for secondary stroke prevention  Continue to follow up with PCP regarding cholesterol and blood pressure management   Appointment with Dr. Corliss Skainseveshwar on 11/29/17  Appointment with cardiology on 11/22/17  Continue to monitor blood pressure at home  Once you go home, ensure that you are having therapies come to your house for continued therapy  Consider life alert button while you will be at home by yourself - especially being on eliquis, prevent falls due to increased risk of brain bleed  Maintain strict control of hypertension with blood pressure goal below 130/90, diabetes with hemoglobin A1c goal below 6.5% and cholesterol with LDL cholesterol (bad cholesterol) goal below 70 mg/dL. I also advised the patient to eat a healthy diet with plenty of whole grains, cereals, fruits and vegetables, exercise regularly and maintain ideal body weight.  Followup in the future with me in 3 months or call earlier if needed       Thank you for coming [facility] at [facility]. I hope we have been able to provide you high quality care today.  You may receive a patient satisfaction survey over the next few weeks. We would appreciate your feedback and comments so that we may continue to improve ourselves and the health of our patients.   It is important to avoid accidents which may result in broken bones.  Here are a few ideas on how to make your home safer so you will be less likely to trip or fall.  1. Use nonskid mats or non slip strips in your shower or tub, on your bathroom floor and around sinks.  If you know that you have spilled water, wipe it up! 2. In the bathroom, it is important to have properly installed grab bars on the walls or on the edge of the tub.  Towel racks are NOT strong enough for you to hold onto or to pull on for support. 3. Stairs and hallways should have enough light.  Add lamps or night  lights if you need ore light. 4. It is good to have handrails on both sides of the stairs if possible.  Always fix broken handrails right away. 5. It is important to see the edges of steps.  Paint the edges of outdoor steps white so you can see them better.  Put colored tape on the edge of inside steps. 6. Throw-rugs are dangerous because they can slide.  Removing the rugs is the best idea, but if they must stay, add adhesive carpet tape to prevent slipping. 7. Do not keep things on stairs or in the halls.  Remove small furniture that blocks the halls as it may cause you to trip.  Keep telephone and electrical cords out of the way where you walk. 8. Always were sturdy, rubber-soled shoes for good support.  Never wear just socks, especially on the stairs.  Socks may cause you to slip or fall.  Do not wear full-length housecoats as you can easily trip on the bottom.  9. Place the things you use the most on the shelves that are the easiest to reach.  If you use a stepstool, make sure it is in good condition.  If you feel unsteady, DO NOT climb, ask for help. 10. If a health professional advises you to use a cane or walker, do not be ashamed.  These items can keep you from falling and breaking your bones.

## 2017-11-17 NOTE — Progress Notes (Signed)
[facility] [address] [place]. [place] [address] [phone]       OFFICE FOLLOW UP NOTE  Ms. NANI INGRAM Date of Birth:  01/29/1941 Medical Record Number:  [id number]   Reason for Referral:  hospital stroke follow up  CHIEF COMPLAINT:  Chief Complaint  Patient presents with  . Follow-up    Hospital Stroke follow up pt seen by Dr. Pearlean Brownie and Dr Roda Shutters room 9 patient with sister Elana Alm  pt is at [employer] pt going home tomorow     HPI: DAYANA DALPORTO is being seen today for initial visit in the office for left PICA territory acute infarct secondary to newly diagnosed A. fib versus large vessel disease versus steal syndrome on 10/14/2017. History obtained from patient, sister and chart review. Reviewed all radiology images and labs personally.  Ms. TILLY PERNICE is a 77 y.o. female with history of a previous stroke, hypertension, hyperlipidemia, coronary artery disease, subclavian steal syndrome, and new onset atrial fibrillation  who presented with vertigo, diaphoresis, nausea and vomiting and presented to [facility]. She did not receive IV t-PA due to late presentation.  CT head at [facility] showed left inferior cerebellum acute infarct.  MRI head at [facility] showed left PICA territory territory infarct with chronic left MCA infarct.  MRA head at [facility] showed left VA occlusion and left PICA not visualized.  Patient was found to be in atrial fibrillation with RVR and was transferred to [facility] for further evaluation.  Carotid Dopplers were severely abnormal, dampened, preocclusive flow with a possible short segment of occlusive disease at the right distal CCA with retrograde flow in the right ECA along with velocities in the left ICA consistent with 80 to 99% stenosis may be a left subclavian artery to common carotid artery and left vertebral artery were not able to be visualized.  2D echo showed an EF of 65 to 70% without cardiac source of  embolus.  LDL 45 and recommended continuation of Lipitor 80 mg daily.  HTN stable during admission and recommended long-term BP goal of 1 30-1 50 due to large vessel stenosis/occlusion.  History of CAD s/p CABG and was on aspirin and Plavix.  Recommended starting Eliquis for atrial fibrillation and secondary stroke prevention along with continuation of asa for CAD.  Patient did undergo cerebral angiogram which occluded innominate and left subclavian artery with subclavian steal and after consult by vascular surgery no intervention recommended recommended close 6 monthly follow-up carotid ultrasound with vascular surgery.  Patient was discharged to SNF for continued therapies.  Patient is being seen today for hospital follow-up and is accompanied by her sister.  She denies any recent headaches and states she occasionally has episodes of dizziness but they have been improving.  She is currently still residing at [facility] where she is participating in PT/OT and is planning on being discharged tomorrow home as she was to independent to be transferred to SNF.  She is currently residing in a wheelchair but is able to ambulate with use of rolling walker and cane.  Social worker at current facility will be calling her daughter today to update and talk about the plan when she returns home.  She continues to take both aspirin and Eliquis with bruising but no bleeding.  Continues to take Lipitor without side effects myalgias.  Blood pressure today satisfactory at 122/76.  Patient does have follow-up appointment with Dr. Corliss Skains on 11/29/2017.  Has an appointment with cardiology on 11/22/2017.  Sister has concerns about patient returning home as they do not have full-time assistance.  Patient is able to complete all ADLs but does still have some difficulties with IADLs such as cooking, making the bed and doing laundry.  Recommended family assistance as needed for these particular activities and also recommended home PT/OT for  continued improvement.  Also recommended patient obtain life alert button as she continues to have intermittent dizziness episodes and being on Eliquis.  Also recommended for patient to obtain Rollator walker where she is able to sit if she starts to have a dizziness episode.  Denies new or worsening stroke/TIA symptoms at this time.   ROS:   14 system review of systems performed and negative with exception of easy bruising, dizziness and spinning sensation  PMH:  Past Medical History:  Diagnosis Date  . Arthritis    "arms, hands, neck, shoulders; some in my knees" (10/13/2017)  . CAD (coronary artery disease)   . CVA (cerebral vascular accident) (HCC) 1995, 2001   /notes 08/11/2010; "weakness in right arm as a result" (10/13/2017)  . History of blood transfusion    "related to OHS"  . History of kidney stones   . Hyperlipidemia   . Hypertension   . New onset a-fib (HCC) 10/13/2017  . Pneumonia    "when I was little"  . Subclavian steal syndrome 2004   LUE/notes 10/13/2017  . TIA (transient ischemic attack) 2003   [name] 08/11/2010  . Vertigo 10/13/2017   ataxia/notes 10/13/2017    PSH:  Past Surgical History:  Procedure Laterality Date  . ABDOMINAL HYSTERECTOMY    . CARDIAC CATHETERIZATION  04/2003   [name] 08/11/2010  . CAROTID ARTERY - SUBCLAVIAN ARTERY BYPASS GRAFT Left 10/2002   carotid subclavian bypass with a 6 mm Dacron graft/notes 08/11/2010   . CARPAL TUNNEL RELEASE Right   . CHOLECYSTECTOMY OPEN    . CORONARY ANGIOPLASTY  04/2002   [name] 08/11/2010  . CORONARY ARTERY BYPASS GRAFT  05/2003   CABG X4/notes 08/11/2010  . CYSTOSCOPY W/ STONE MANIPULATION    . DILATION AND CURETTAGE OF UTERUS    . HERNIA REPAIR    . IR ANGIO INTRA EXTRACRAN SEL COM CAROTID INNOMINATE UNI L MOD SED  10/17/2017  . IR ANGIO INTRA EXTRACRAN SEL COM CAROTID INNOMINATE UNI R MOD SED  10/17/2017  . IR ANGIO VERTEBRAL SEL SUBCLAVIAN INNOMINATE BILAT MOD SED  10/17/2017  . IR ANGIOGRAM EXTREMITY  BILATERAL  10/17/2017  . MEDIASTINAL EXPLORATION  05/2003   [name] 08/11/2010  . UMBILICAL HERNIA REPAIR      Social History:  Social History   Socioeconomic History  . Marital status: Widowed    Spouse name: Not on file  . Number of children: Not on file  . Years of education: Not on file  . Highest education level: Not on file  Occupational History  . Not on file  Social Needs  . Financial resource strain: Not on file  . Food insecurity:    Worry: Not on file    Inability: Not on file  . Transportation needs:    Medical: Not on file    Non-medical: Not on file  Tobacco Use  . Smoking status: Former Smoker    Packs/day: 0.50    Years: 25.00    Pack years: 12.50    Types: Cigarettes    Last attempt to quit: 1994    Years since quitting: 25.6  . Smokeless tobacco: Never Used  Substance and Sexual Activity  .  Alcohol use: Yes    Frequency: Never    Comment: 10/13/2017 "glass of wine at Christmas"  . Drug use: Never  . Sexual activity: Yes  Lifestyle  . Physical activity:    Days per week: Not on file    Minutes per session: Not on file  . Stress: Not on file  Relationships  . Social connections:    Talks on phone: Not on file    Gets together: Not on file    Attends religious service: Not on file    Active member of club or organization: Not on file    Attends meetings of clubs or organizations: Not on file    Relationship status: Not on file  . Intimate partner violence:    Fear of current or ex partner: Not on file    Emotionally abused: Not on file    Physically abused: Not on file    Forced sexual activity: Not on file  Other Topics Concern  . Not on file  Social History Narrative  . Not on file    Family History:  Family History  Problem Relation Age of Onset  . Heart disease Father   . Stroke Mother     Medications:   Current Outpatient Medications on File Prior to Visit  Medication Sig Dispense Refill  . acetaminophen (TYLENOL) 500 MG tablet  Take 1,000 mg by mouth as needed for mild pain or headache.    . alendronate (FOSAMAX) 70 MG tablet Take 70 mg by mouth once a week.  4  . apixaban (ELIQUIS) 5 MG TABS tablet Take 1 tablet (5 mg total) by mouth 2 (two) times daily. 60 tablet 0  . aspirin EC 81 MG tablet Take 81 mg by mouth daily.    Marland Kitchen. atorvastatin (LIPITOR) 80 MG tablet Take 80 mg by mouth daily.  3  . bethanechol (URECHOLINE) 25 MG tablet Take 25 mg by mouth as needed.    . carvedilol (COREG) 3.125 MG tablet Take 1 tablet (3.125 mg total) by mouth 2 (two) times daily. 60 tablet 0  . hydroxypropyl methylcellulose / hypromellose (ISOPTO TEARS / GONIOVISC) 2.5 % ophthalmic solution Place 1 drop into both eyes as needed for dry eyes.    . meclizine (ANTIVERT) 25 MG tablet Take 25 mg by mouth 3 (three) times daily as needed for dizziness.    . ondansetron (ZOFRAN) 4 MG tablet Take 4 mg by mouth every 8 (eight) hours as needed for nausea or vomiting.    . ranitidine (ZANTAC) 150 MG tablet Take 150 mg by mouth 2 (two) times daily.  3   No current facility-administered medications on file prior to visit.     Allergies:   Allergies  Allergen Reactions  . Contrast Media [Iodinated Diagnostic Agents]   . Codeine Other (See Comments)     Physical Exam  Vitals:   11/17/17 1241  BP: 122/76  Pulse: 68  Weight: 159 lb (72.1 kg)  Height: 5\' 1"  (1.549 m)   Body mass index is 30.04 kg/m. No exam data present  General: well developed, well nourished, pleasant elderly Caucasian female, seated, in no evident distress Head: head normocephalic and atraumatic.   Neck: supple with no carotid or supraclavicular bruits Cardiovascular: regular rate and rhythm, no murmurs Musculoskeletal: no deformity Skin:  no rash/petichiae Vascular:  Normal pulses all extremities  Neurologic Exam Mental Status: Awake and fully alert. Oriented to place and time. Recent and remote memory intact. Attention span, concentration and fund of  knowledge  appropriate. Mood and affect appropriate.  Cranial Nerves: Fundoscopic exam reveals sharp disc margins. Pupils equal, briskly reactive to light. Extraocular movements full without nystagmus. Visual fields full to confrontation. Hearing intact. Facial sensation intact. Face, tongue, palate moves normally and symmetrically.  Motor: Normal bulk and tone. 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  Coordination: Rapid alternating movements normal in all extremities. Finger-to-nose and heel-to-shin performed accurately bilaterally. Gait and Station: Patient currently sitting in wheelchair and as she did not bring cane or walker to appointment, gait is deferred Reflexes: 1+ and symmetric. Toes downgoing.    NIHSS  0 Modified Rankin  2 HAS-BLED 2 CHA2DS2-VASc 6   Diagnostic Data (Labs, Imaging, Testing)  CT HEAD WO CONTRAST 10/14/2017 IMPRESSION: 1. Stable distribution of left inferior cerebellum acute infarction. No associated mass effect or hemorrhage. 2. No new acute intracranial abnormality identified. 3. Stable background of chronic microvascular ischemic changes, volume loss, and chronic infarcts of the brain.  MR BRAIN WO CONTRAST [facility]) MRA head/neck [facility]) 10/13/17 IMPRESSION: 1. Acute nonhemorrhagic left posterior inferior cerebellar infarct. 2. Occluded left vertebral artery and PICA corresponding with the infarct. 3. Diffuse medium and small vessel disease without other significant proximal stenosis, aneurysm, or branch vessel occlusion. 4. Remote infarcts involving the left insular cortex, lentiform nucleus, and operculum. 5. Atrophy and diffuse white matter disease is advanced for age. 6. Mild degenerative changes of the cervical spine at C3-4.  ECHOCARDIOGRAM 10/14/17 Impressions: -  Hyperdynamic LVEF. Thickened aortic valve leaflets and   significant calcifications in the coronary sinuses, no aortic   stenosis, mild regurgitation. Mild mitral and moderate tricuspid   regurgitation. PM in place. Mildly dilated ascending aorta   measuring 41 mm.  CEREBRAL ANGIOGRAM 10/17/17 IMPRESSION: Approximately 50-60% stenosis of the left internal carotid artery at the bulb. Angiographically occluded left subclavian artery, with patent left common carotid artery to left subclavian graft with patency of approximately 50%. Subsequent antegrade flow into the left vertebral artery into the posterior fossa is noted. Right subclavian steal from the left vertebral artery with subsequent opacification of the right common carotid artery retrogradely and then antegradely as described above. Angiographically occluded innominate artery. Approximately 50% stenosis of the origin of the left common carotid artery. Severe stenosis of the right common iliac artery at its origin, and distal to this to the mid right common iliac artery.     ASSESSMENT: Diamond [name] is a 77 y.o. year old female here with left PICA territory infarct on 10/13/17 secondary to newly diagnosed PAF versus large vessel disease versus dual syndrome. Vascular risk factors include previous stroke, HTN, HLD, CAD, subclavian steal syndrome and new onset PAF.  Patient is being seen today for hospital stroke follow-up and overall continues to do well except for intermittent dizzy episodes.    PLAN: -Continue Eliquis (apixaban) daily  and lipitor  for secondary stroke prevention -F/u with PCP regarding your HLD and HTN management -f/u with Dr. Corliss Skains on 11/29/17 -f/u with cardiology as scheduled on 11/22/17 for PAF on [facility] management along with other cardiac conditions -continue to monitor BP at home -Fall prevention -information provided in AVS  -Recommend home PT/OT once returns home -advised to continue to stay active  and maintain a healthy diet -Recommend life alert button once patient returns home -Maintain strict control of hypertension with blood pressure  goal below 130/90, diabetes with hemoglobin A1c goal below 6.5% and cholesterol with LDL cholesterol (bad cholesterol) goal below 70 mg/dL. I also advised the patient to eat a healthy diet with plenty of whole grains, cereals, fruits and vegetables, exercise regularly and maintain ideal body weight.  Follow up in 3 months or call earlier if needed   Greater than 50% of time during this 25 minute visit was spent on counseling,explanation of diagnosis of left PICA territory infarct, reviewing risk factor management of PAF, HTN, HLD, CAD and subclavian steal syndrome, planning of further management, discussion with patient and family and coordination of care    George Hugh, AGNP-BC  [facility] [address] [place], [place] [address]  Phone [phone] Fax [phone] Note: This document was prepared with digital dictation and possible smart phrase technology. Any transcriptional errors that result from this process are unintentional.

## 2017-11-19 DIAGNOSIS — E871 Hypo-osmolality and hyponatremia: Secondary | ICD-10-CM | POA: Diagnosis not present

## 2017-11-19 DIAGNOSIS — I2510 Atherosclerotic heart disease of native coronary artery without angina pectoris: Secondary | ICD-10-CM | POA: Diagnosis not present

## 2017-11-19 DIAGNOSIS — I739 Peripheral vascular disease, unspecified: Secondary | ICD-10-CM | POA: Diagnosis not present

## 2017-11-19 DIAGNOSIS — I69393 Ataxia following cerebral infarction: Secondary | ICD-10-CM | POA: Diagnosis not present

## 2017-11-19 DIAGNOSIS — Z7901 Long term (current) use of anticoagulants: Secondary | ICD-10-CM | POA: Diagnosis not present

## 2017-11-19 DIAGNOSIS — I10 Essential (primary) hypertension: Secondary | ICD-10-CM | POA: Diagnosis not present

## 2017-11-19 DIAGNOSIS — G40909 Epilepsy, unspecified, not intractable, without status epilepticus: Secondary | ICD-10-CM | POA: Diagnosis not present

## 2017-11-19 DIAGNOSIS — M810 Age-related osteoporosis without current pathological fracture: Secondary | ICD-10-CM | POA: Diagnosis not present

## 2017-11-19 DIAGNOSIS — Z951 Presence of aortocoronary bypass graft: Secondary | ICD-10-CM | POA: Diagnosis not present

## 2017-11-19 DIAGNOSIS — Z9181 History of falling: Secondary | ICD-10-CM | POA: Diagnosis not present

## 2017-11-19 DIAGNOSIS — Z90710 Acquired absence of both cervix and uterus: Secondary | ICD-10-CM | POA: Diagnosis not present

## 2017-11-19 DIAGNOSIS — Z87440 Personal history of urinary (tract) infections: Secondary | ICD-10-CM | POA: Diagnosis not present

## 2017-11-19 DIAGNOSIS — I69351 Hemiplegia and hemiparesis following cerebral infarction affecting right dominant side: Secondary | ICD-10-CM | POA: Diagnosis not present

## 2017-11-19 DIAGNOSIS — Z7982 Long term (current) use of aspirin: Secondary | ICD-10-CM | POA: Diagnosis not present

## 2017-11-19 DIAGNOSIS — R339 Retention of urine, unspecified: Secondary | ICD-10-CM | POA: Diagnosis not present

## 2017-11-19 DIAGNOSIS — I4891 Unspecified atrial fibrillation: Secondary | ICD-10-CM | POA: Diagnosis not present

## 2017-11-19 DIAGNOSIS — Z9049 Acquired absence of other specified parts of digestive tract: Secondary | ICD-10-CM | POA: Diagnosis not present

## 2017-11-19 DIAGNOSIS — Z952 Presence of prosthetic heart valve: Secondary | ICD-10-CM | POA: Diagnosis not present

## 2017-11-21 ENCOUNTER — Other Ambulatory Visit: Payer: Self-pay

## 2017-11-21 DIAGNOSIS — I69393 Ataxia following cerebral infarction: Secondary | ICD-10-CM | POA: Diagnosis not present

## 2017-11-21 DIAGNOSIS — I4891 Unspecified atrial fibrillation: Secondary | ICD-10-CM | POA: Diagnosis not present

## 2017-11-21 DIAGNOSIS — I69351 Hemiplegia and hemiparesis following cerebral infarction affecting right dominant side: Secondary | ICD-10-CM | POA: Diagnosis not present

## 2017-11-21 DIAGNOSIS — I10 Essential (primary) hypertension: Secondary | ICD-10-CM | POA: Diagnosis not present

## 2017-11-21 DIAGNOSIS — I2510 Atherosclerotic heart disease of native coronary artery without angina pectoris: Secondary | ICD-10-CM | POA: Diagnosis not present

## 2017-11-21 DIAGNOSIS — I739 Peripheral vascular disease, unspecified: Secondary | ICD-10-CM | POA: Diagnosis not present

## 2017-11-21 NOTE — Patient Outreach (Signed)
[facility]) Care Management  11/21/2017  Leticia Clasatty B Sweigert [birth year] [id number]   EMMI- General Discharge RED ON EMMI ALERT Day # 1 Date: 11/19/17 Red Alert Reason:   Unfilled prescriptions? Yes  Other questions/problems? Yes    Outreach attempt: spoke with patient. she is able to verify HIPAA.  She states she is doing good since being home.  Addressed red flags with patient. She states she has all her medications and is taking medication as prescribed.  Patient has appointments with her physicians scheduled and will be setting up transportation.  [facility] and adds that PT is to come today.    Discussed [facility] services.  Patient declines any further needs at this time.     Plan: RN CM will close case.    Bary Lericheionne J Osmara Drummonds, RN, MSN [facility] Care Management Care Management Coordinator Direct Line [phone] Toll Free: [phone]  Fax: [phone]

## 2017-11-22 ENCOUNTER — Ambulatory Visit: Payer: Self-pay | Admitting: Cardiology

## 2017-11-22 DIAGNOSIS — I10 Essential (primary) hypertension: Secondary | ICD-10-CM | POA: Diagnosis not present

## 2017-11-22 DIAGNOSIS — I739 Peripheral vascular disease, unspecified: Secondary | ICD-10-CM | POA: Diagnosis not present

## 2017-11-22 DIAGNOSIS — I69393 Ataxia following cerebral infarction: Secondary | ICD-10-CM | POA: Diagnosis not present

## 2017-11-22 DIAGNOSIS — I2510 Atherosclerotic heart disease of native coronary artery without angina pectoris: Secondary | ICD-10-CM | POA: Diagnosis not present

## 2017-11-22 DIAGNOSIS — I69351 Hemiplegia and hemiparesis following cerebral infarction affecting right dominant side: Secondary | ICD-10-CM | POA: Diagnosis not present

## 2017-11-22 DIAGNOSIS — I4891 Unspecified atrial fibrillation: Secondary | ICD-10-CM | POA: Diagnosis not present

## 2017-11-24 DIAGNOSIS — I69393 Ataxia following cerebral infarction: Secondary | ICD-10-CM | POA: Diagnosis not present

## 2017-11-24 DIAGNOSIS — I69351 Hemiplegia and hemiparesis following cerebral infarction affecting right dominant side: Secondary | ICD-10-CM | POA: Diagnosis not present

## 2017-11-24 DIAGNOSIS — I2510 Atherosclerotic heart disease of native coronary artery without angina pectoris: Secondary | ICD-10-CM | POA: Diagnosis not present

## 2017-11-24 DIAGNOSIS — I4891 Unspecified atrial fibrillation: Secondary | ICD-10-CM | POA: Diagnosis not present

## 2017-11-24 DIAGNOSIS — I10 Essential (primary) hypertension: Secondary | ICD-10-CM | POA: Diagnosis not present

## 2017-11-24 DIAGNOSIS — I739 Peripheral vascular disease, unspecified: Secondary | ICD-10-CM | POA: Diagnosis not present

## 2017-11-25 NOTE — Progress Notes (Signed)
I agree with the above plan 

## 2017-11-29 ENCOUNTER — Ambulatory Visit (HOSPITAL_COMMUNITY): Admission: RE | Admit: 2017-11-29 | Payer: Medicare Other | Source: Ambulatory Visit

## 2017-11-29 DIAGNOSIS — I4891 Unspecified atrial fibrillation: Secondary | ICD-10-CM | POA: Diagnosis not present

## 2017-11-29 DIAGNOSIS — I739 Peripheral vascular disease, unspecified: Secondary | ICD-10-CM | POA: Diagnosis not present

## 2017-11-29 DIAGNOSIS — I69393 Ataxia following cerebral infarction: Secondary | ICD-10-CM | POA: Diagnosis not present

## 2017-11-29 DIAGNOSIS — I69351 Hemiplegia and hemiparesis following cerebral infarction affecting right dominant side: Secondary | ICD-10-CM | POA: Diagnosis not present

## 2017-11-29 DIAGNOSIS — I10 Essential (primary) hypertension: Secondary | ICD-10-CM | POA: Diagnosis not present

## 2017-11-29 DIAGNOSIS — I2510 Atherosclerotic heart disease of native coronary artery without angina pectoris: Secondary | ICD-10-CM | POA: Diagnosis not present

## 2017-12-01 DIAGNOSIS — I2510 Atherosclerotic heart disease of native coronary artery without angina pectoris: Secondary | ICD-10-CM | POA: Diagnosis not present

## 2017-12-01 DIAGNOSIS — I69393 Ataxia following cerebral infarction: Secondary | ICD-10-CM | POA: Diagnosis not present

## 2017-12-01 DIAGNOSIS — I739 Peripheral vascular disease, unspecified: Secondary | ICD-10-CM | POA: Diagnosis not present

## 2017-12-01 DIAGNOSIS — I4891 Unspecified atrial fibrillation: Secondary | ICD-10-CM | POA: Diagnosis not present

## 2017-12-01 DIAGNOSIS — I10 Essential (primary) hypertension: Secondary | ICD-10-CM | POA: Diagnosis not present

## 2017-12-01 DIAGNOSIS — I69351 Hemiplegia and hemiparesis following cerebral infarction affecting right dominant side: Secondary | ICD-10-CM | POA: Diagnosis not present

## 2017-12-05 DIAGNOSIS — I10 Essential (primary) hypertension: Secondary | ICD-10-CM | POA: Diagnosis not present

## 2017-12-05 DIAGNOSIS — I739 Peripheral vascular disease, unspecified: Secondary | ICD-10-CM | POA: Diagnosis not present

## 2017-12-05 DIAGNOSIS — I4891 Unspecified atrial fibrillation: Secondary | ICD-10-CM | POA: Diagnosis not present

## 2017-12-05 DIAGNOSIS — I69393 Ataxia following cerebral infarction: Secondary | ICD-10-CM | POA: Diagnosis not present

## 2017-12-05 DIAGNOSIS — I69351 Hemiplegia and hemiparesis following cerebral infarction affecting right dominant side: Secondary | ICD-10-CM | POA: Diagnosis not present

## 2017-12-05 DIAGNOSIS — I2510 Atherosclerotic heart disease of native coronary artery without angina pectoris: Secondary | ICD-10-CM | POA: Diagnosis not present

## 2017-12-07 DIAGNOSIS — I10 Essential (primary) hypertension: Secondary | ICD-10-CM | POA: Diagnosis not present

## 2017-12-07 DIAGNOSIS — I2510 Atherosclerotic heart disease of native coronary artery without angina pectoris: Secondary | ICD-10-CM | POA: Diagnosis not present

## 2017-12-07 DIAGNOSIS — I4891 Unspecified atrial fibrillation: Secondary | ICD-10-CM | POA: Diagnosis not present

## 2017-12-07 DIAGNOSIS — I69351 Hemiplegia and hemiparesis following cerebral infarction affecting right dominant side: Secondary | ICD-10-CM | POA: Diagnosis not present

## 2017-12-07 DIAGNOSIS — I739 Peripheral vascular disease, unspecified: Secondary | ICD-10-CM | POA: Diagnosis not present

## 2017-12-07 DIAGNOSIS — I69393 Ataxia following cerebral infarction: Secondary | ICD-10-CM | POA: Diagnosis not present

## 2017-12-08 DIAGNOSIS — I4891 Unspecified atrial fibrillation: Secondary | ICD-10-CM | POA: Diagnosis not present

## 2017-12-08 DIAGNOSIS — I69351 Hemiplegia and hemiparesis following cerebral infarction affecting right dominant side: Secondary | ICD-10-CM | POA: Diagnosis not present

## 2017-12-08 DIAGNOSIS — I739 Peripheral vascular disease, unspecified: Secondary | ICD-10-CM | POA: Diagnosis not present

## 2017-12-08 DIAGNOSIS — I2510 Atherosclerotic heart disease of native coronary artery without angina pectoris: Secondary | ICD-10-CM | POA: Diagnosis not present

## 2017-12-08 DIAGNOSIS — I69393 Ataxia following cerebral infarction: Secondary | ICD-10-CM | POA: Diagnosis not present

## 2017-12-08 DIAGNOSIS — I10 Essential (primary) hypertension: Secondary | ICD-10-CM | POA: Diagnosis not present

## 2017-12-09 DIAGNOSIS — I739 Peripheral vascular disease, unspecified: Secondary | ICD-10-CM | POA: Diagnosis not present

## 2017-12-09 DIAGNOSIS — R110 Nausea: Secondary | ICD-10-CM | POA: Diagnosis not present

## 2017-12-09 DIAGNOSIS — I2510 Atherosclerotic heart disease of native coronary artery without angina pectoris: Secondary | ICD-10-CM | POA: Diagnosis not present

## 2017-12-09 DIAGNOSIS — I639 Cerebral infarction, unspecified: Secondary | ICD-10-CM | POA: Diagnosis not present

## 2017-12-09 DIAGNOSIS — E785 Hyperlipidemia, unspecified: Secondary | ICD-10-CM | POA: Diagnosis not present

## 2017-12-09 DIAGNOSIS — I69351 Hemiplegia and hemiparesis following cerebral infarction affecting right dominant side: Secondary | ICD-10-CM | POA: Diagnosis not present

## 2017-12-09 DIAGNOSIS — I4891 Unspecified atrial fibrillation: Secondary | ICD-10-CM | POA: Diagnosis not present

## 2017-12-09 DIAGNOSIS — I10 Essential (primary) hypertension: Secondary | ICD-10-CM | POA: Diagnosis not present

## 2017-12-09 DIAGNOSIS — I69393 Ataxia following cerebral infarction: Secondary | ICD-10-CM | POA: Diagnosis not present

## 2017-12-09 DIAGNOSIS — R42 Dizziness and giddiness: Secondary | ICD-10-CM | POA: Diagnosis not present

## 2017-12-09 DIAGNOSIS — Z1339 Encounter for screening examination for other mental health and behavioral disorders: Secondary | ICD-10-CM | POA: Diagnosis not present

## 2017-12-14 ENCOUNTER — Ambulatory Visit (INDEPENDENT_AMBULATORY_CARE_PROVIDER_SITE_OTHER): Payer: Medicare Other | Admitting: Cardiology

## 2017-12-14 ENCOUNTER — Encounter: Payer: Self-pay | Admitting: Cardiology

## 2017-12-14 VITALS — BP 120/80 | HR 69 | Ht 61.0 in | Wt 166.4 lb

## 2017-12-14 DIAGNOSIS — E782 Mixed hyperlipidemia: Secondary | ICD-10-CM

## 2017-12-14 DIAGNOSIS — I2510 Atherosclerotic heart disease of native coronary artery without angina pectoris: Secondary | ICD-10-CM

## 2017-12-14 DIAGNOSIS — Z951 Presence of aortocoronary bypass graft: Secondary | ICD-10-CM

## 2017-12-14 DIAGNOSIS — Z8673 Personal history of transient ischemic attack (TIA), and cerebral infarction without residual deficits: Secondary | ICD-10-CM | POA: Diagnosis not present

## 2017-12-14 DIAGNOSIS — G458 Other transient cerebral ischemic attacks and related syndromes: Secondary | ICD-10-CM

## 2017-12-14 DIAGNOSIS — I480 Paroxysmal atrial fibrillation: Secondary | ICD-10-CM | POA: Diagnosis not present

## 2017-12-14 DIAGNOSIS — I10 Essential (primary) hypertension: Secondary | ICD-10-CM

## 2017-12-14 MED ORDER — APIXABAN 5 MG PO TABS: 5.0000 mg | ORAL_TABLET | Freq: Two times a day (BID) | ORAL | 2 refills | Status: DC

## 2017-12-14 NOTE — Progress Notes (Signed)
Cardiology Office Note:    Date:  12/14/2017   ID:  Diamond [name], DOB 07/30/1940, MRN [id number]  PCP:  Simone Curia, MD  Cardiologist:  Garwin Brothers, MD   Referring MD: Simone Curia, MD    ASSESSMENT:    1. Coronary artery disease involving native coronary artery of native heart without angina pectoris   2. Essential hypertension   3. Mixed hyperlipidemia   4. PAF (paroxysmal atrial fibrillation) (HCC)   5. Subclavian steal syndrome   6. History of stroke   7. Hx of CABG    PLAN:    In order of problems listed above:  1. Secondary prevention stressed with the patient.  Importance of compliance with diet and medication stressed and she vocalized understanding.  Her blood pressure is stable. 2. Diet was discussed with dyslipidemia and obesity and weight reduction was stressed 3. I discussed with the patient atrial fibrillation, disease process. Management and therapy including rate and rhythm control, anticoagulation benefits and potential risks were discussed extensively with the patient. Patient had multiple questions which were answered to patient's satisfaction. 4. She will have blood work in the next with her primary care physician which will include lipids. 5. Patient will be seen in follow-up appointment in 6 months or earlier if the patient has any concerns    Medication Adjustments/Labs and Tests Ordered: Current medicines are reviewed at length with the patient today.  Concerns regarding medicines are outlined above.  No orders of the defined types were placed in this encounter.  No orders of the defined types were placed in this encounter.    No chief complaint on file.    History of Present Illness:    Diamond [name] is a 77 y.o. female.  The patient with known coronary artery disease post CABG surgery.  She recently was admitted to the hospital with a stroke.  She has been diagnosed to have approximately fibrillation.  She is recovered well from the stroke and  walks with her gait is stable.  She denies any chest pain orthopnea or PND.  At the time of my evaluation, the patient is alert awake oriented and in no distress.  She is on anticoagulation at this time.  Past Medical History:  Diagnosis Date  . Arthritis    "arms, hands, neck, shoulders; some in my knees" (10/13/2017)  . CAD (coronary artery disease)   . CVA (cerebral vascular accident) (HCC) 1995, 2001   /notes 08/11/2010; "weakness in right arm as a result" (10/13/2017)  . History of blood transfusion    "related to OHS"  . History of kidney stones   . Hyperlipidemia   . Hypertension   . New onset a-fib (HCC) 10/13/2017  . Pneumonia    "when I was little"  . Subclavian steal syndrome 2004   LUE/notes 10/13/2017  . TIA (transient ischemic attack) 2003   [name] 08/11/2010  . Vertigo 10/13/2017   ataxia/notes 10/13/2017    Past Surgical History:  Procedure Laterality Date  . ABDOMINAL HYSTERECTOMY    . CARDIAC CATHETERIZATION  04/2003   [name] 08/11/2010  . CAROTID ARTERY - SUBCLAVIAN ARTERY BYPASS GRAFT Left 10/2002   carotid subclavian bypass with a 6 mm Dacron graft/notes 08/11/2010   . CARPAL TUNNEL RELEASE Right   . CHOLECYSTECTOMY OPEN    . CORONARY ANGIOPLASTY  04/2002   [name] 08/11/2010  . CORONARY ARTERY BYPASS GRAFT  05/2003   CABG X4/notes 08/11/2010  . CYSTOSCOPY W/ STONE MANIPULATION    .  DILATION AND CURETTAGE OF UTERUS    . HERNIA REPAIR    . IR ANGIO INTRA EXTRACRAN SEL COM CAROTID INNOMINATE UNI L MOD SED  10/17/2017  . IR ANGIO INTRA EXTRACRAN SEL COM CAROTID INNOMINATE UNI R MOD SED  10/17/2017  . IR ANGIO VERTEBRAL SEL SUBCLAVIAN INNOMINATE BILAT MOD SED  10/17/2017  . IR ANGIOGRAM EXTREMITY BILATERAL  10/17/2017  . MEDIASTINAL EXPLORATION  05/2003   [name] 08/11/2010  . UMBILICAL HERNIA REPAIR      Current Medications: Current Meds  Medication Sig  . acetaminophen (TYLENOL) 500 MG tablet Take 1,000 mg by mouth as needed for mild pain or headache.  .  alendronate (FOSAMAX) 70 MG tablet Take 70 mg by mouth once a week.  Marland Kitchen apixaban (ELIQUIS) 5 MG TABS tablet Take 1 tablet (5 mg total) by mouth 2 (two) times daily.  Marland Kitchen aspirin EC 81 MG tablet Take 81 mg by mouth daily.  Marland Kitchen atorvastatin (LIPITOR) 80 MG tablet Take 80 mg by mouth daily.  . bethanechol (URECHOLINE) 25 MG tablet Take 25 mg by mouth as needed.  . hydroxypropyl methylcellulose / hypromellose (ISOPTO TEARS / GONIOVISC) 2.5 % ophthalmic solution Place 1 drop into both eyes as needed for dry eyes.  . meclizine (ANTIVERT) 25 MG tablet Take 25 mg by mouth 3 (three) times daily as needed for dizziness.  . ondansetron (ZOFRAN) 4 MG tablet Take 4 mg by mouth every 8 (eight) hours as needed for nausea or vomiting.  . ranitidine (ZANTAC) 150 MG tablet Take 150 mg by mouth 2 (two) times daily.     Allergies:   Contrast media [iodinated diagnostic agents] and Codeine   Social History   Socioeconomic History  . Marital status: Widowed    Spouse name: Not on file  . Number of children: Not on file  . Years of education: Not on file  . Highest education level: Not on file  Occupational History  . Not on file  Social Needs  . Financial resource strain: Not on file  . Food insecurity:    Worry: Not on file    Inability: Not on file  . Transportation needs:    Medical: Not on file    Non-medical: Not on file  Tobacco Use  . Smoking status: Former Smoker    Packs/day: 0.50    Years: 25.00    Pack years: 12.50    Types: Cigarettes    Last attempt to quit: 1994    Years since quitting: 25.7  . Smokeless tobacco: Never Used  Substance and Sexual Activity  . Alcohol use: Yes    Frequency: Never    Comment: 10/13/2017 "glass of wine at Christmas"  . Drug use: Never  . Sexual activity: Yes  Lifestyle  . Physical activity:    Days per week: Not on file    Minutes per session: Not on file  . Stress: Not on file  Relationships  . Social connections:    Talks on phone: Not on file     Gets together: Not on file    Attends religious service: Not on file    Active member of club or organization: Not on file    Attends meetings of clubs or organizations: Not on file    Relationship status: Not on file  Other Topics Concern  . Not on file  Social History Narrative  . Not on file     Family History: The patient's family history includes Heart disease in her father;  Stroke in her mother.  ROS:   Please see the history of present illness.    All other systems reviewed and are negative.  EKGs/Labs/Other Studies Reviewed:    The following studies were reviewed today: I reviewed hospital records extensively and discussed with the patient at length.   Recent Labs: 10/13/2017: ALT 18 10/17/2017: Hemoglobin 11.4; Platelets 294 10/18/2017: BUN 18; Creatinine, Ser 0.63; Potassium 4.3; Sodium 129  Recent Lipid Panel    Component Value Date/Time   CHOL 120 10/14/2017 0230   TRIG 39 10/14/2017 0230   HDL 67 10/14/2017 0230   CHOLHDL 1.8 10/14/2017 0230   VLDL 8 10/14/2017 0230   LDLCALC 45 10/14/2017 0230    Physical Exam:    VS:  BP 120/80 (BP Location: Right Arm, Patient Position: Sitting, Cuff Size: Normal)   Pulse 69   Ht 5\' 1"  (1.549 m)   Wt 166 lb 6.4 oz (75.5 kg)   SpO2 93%   BMI 31.44 kg/m     Wt Readings from Last 3 Encounters:  12/14/17 166 lb 6.4 oz (75.5 kg)  11/17/17 159 lb (72.1 kg)  10/13/17 160 lb (72.6 kg)     GEN: Patient is in no acute distress HEENT: Normal NECK: No JVD; No carotid bruits LYMPHATICS: No lymphadenopathy CARDIAC: Hear sounds regular, 2/6 systolic murmur at the apex. RESPIRATORY:  Clear to auscultation without rales, wheezing or rhonchi  ABDOMEN: Soft, non-tender, non-distended MUSCULOSKELETAL:  No edema; No deformity  SKIN: Warm and dry NEUROLOGIC:  Alert and oriented x 3 PSYCHIATRIC:  Normal affect   Signed, Garwin Brothersajan R Kodie Pick, MD  12/14/2017 10:15 AM    [facility]

## 2017-12-14 NOTE — Addendum Note (Signed)
Addended by: Craige CottaANDERSON, ASHLEY S on: 12/14/2017 10:29 AM   Modules accepted: Orders

## 2017-12-14 NOTE — Patient Instructions (Signed)

## 2017-12-15 ENCOUNTER — Telehealth (HOSPITAL_COMMUNITY): Payer: Self-pay

## 2017-12-15 DIAGNOSIS — I69393 Ataxia following cerebral infarction: Secondary | ICD-10-CM | POA: Diagnosis not present

## 2017-12-15 DIAGNOSIS — I69351 Hemiplegia and hemiparesis following cerebral infarction affecting right dominant side: Secondary | ICD-10-CM | POA: Diagnosis not present

## 2017-12-15 DIAGNOSIS — I4891 Unspecified atrial fibrillation: Secondary | ICD-10-CM | POA: Diagnosis not present

## 2017-12-15 DIAGNOSIS — I10 Essential (primary) hypertension: Secondary | ICD-10-CM | POA: Diagnosis not present

## 2017-12-15 DIAGNOSIS — I739 Peripheral vascular disease, unspecified: Secondary | ICD-10-CM | POA: Diagnosis not present

## 2017-12-15 DIAGNOSIS — I2510 Atherosclerotic heart disease of native coronary artery without angina pectoris: Secondary | ICD-10-CM | POA: Diagnosis not present

## 2017-12-15 NOTE — Telephone Encounter (Signed)
Called to schedule f/u. Pt stated that she lives in [place] and doesn't drive. She doesn't have anyone that can bring her at this time. She states she is doing fine and would like to just f/u with her doctor there in [place] since they are closer to her. AW

## 2017-12-16 DIAGNOSIS — I2510 Atherosclerotic heart disease of native coronary artery without angina pectoris: Secondary | ICD-10-CM | POA: Diagnosis not present

## 2017-12-16 DIAGNOSIS — M810 Age-related osteoporosis without current pathological fracture: Secondary | ICD-10-CM | POA: Diagnosis not present

## 2017-12-16 DIAGNOSIS — I639 Cerebral infarction, unspecified: Secondary | ICD-10-CM | POA: Diagnosis not present

## 2017-12-16 DIAGNOSIS — M25511 Pain in right shoulder: Secondary | ICD-10-CM | POA: Diagnosis not present

## 2017-12-21 DIAGNOSIS — I69351 Hemiplegia and hemiparesis following cerebral infarction affecting right dominant side: Secondary | ICD-10-CM | POA: Diagnosis not present

## 2017-12-21 DIAGNOSIS — I4891 Unspecified atrial fibrillation: Secondary | ICD-10-CM | POA: Diagnosis not present

## 2017-12-21 DIAGNOSIS — I10 Essential (primary) hypertension: Secondary | ICD-10-CM | POA: Diagnosis not present

## 2017-12-21 DIAGNOSIS — I2510 Atherosclerotic heart disease of native coronary artery without angina pectoris: Secondary | ICD-10-CM | POA: Diagnosis not present

## 2017-12-21 DIAGNOSIS — I739 Peripheral vascular disease, unspecified: Secondary | ICD-10-CM | POA: Diagnosis not present

## 2017-12-21 DIAGNOSIS — I69393 Ataxia following cerebral infarction: Secondary | ICD-10-CM | POA: Diagnosis not present

## 2017-12-23 DIAGNOSIS — I2510 Atherosclerotic heart disease of native coronary artery without angina pectoris: Secondary | ICD-10-CM | POA: Diagnosis not present

## 2017-12-23 DIAGNOSIS — I639 Cerebral infarction, unspecified: Secondary | ICD-10-CM | POA: Diagnosis not present

## 2017-12-23 DIAGNOSIS — M810 Age-related osteoporosis without current pathological fracture: Secondary | ICD-10-CM | POA: Diagnosis not present

## 2017-12-23 DIAGNOSIS — E785 Hyperlipidemia, unspecified: Secondary | ICD-10-CM | POA: Diagnosis not present

## 2017-12-23 DIAGNOSIS — M25511 Pain in right shoulder: Secondary | ICD-10-CM | POA: Diagnosis not present

## 2017-12-23 DIAGNOSIS — I10 Essential (primary) hypertension: Secondary | ICD-10-CM | POA: Diagnosis not present

## 2018-01-06 DIAGNOSIS — I2510 Atherosclerotic heart disease of native coronary artery without angina pectoris: Secondary | ICD-10-CM | POA: Diagnosis not present

## 2018-01-06 DIAGNOSIS — K219 Gastro-esophageal reflux disease without esophagitis: Secondary | ICD-10-CM | POA: Diagnosis not present

## 2018-01-06 DIAGNOSIS — M810 Age-related osteoporosis without current pathological fracture: Secondary | ICD-10-CM | POA: Diagnosis not present

## 2018-01-06 DIAGNOSIS — I639 Cerebral infarction, unspecified: Secondary | ICD-10-CM | POA: Diagnosis not present

## 2018-02-16 ENCOUNTER — Ambulatory Visit: Payer: Medicare Other | Admitting: Adult Health

## 2018-02-16 ENCOUNTER — Telehealth: Payer: Self-pay

## 2018-02-16 NOTE — Telephone Encounter (Signed)
Patient no show for appointment today.

## 2018-02-16 NOTE — Progress Notes (Deleted)
[facility] [address] [place]. [place] [address] [phone]       OFFICE FOLLOW UP NOTE  [name] Date of Birth:  Apr 23, 1940 Medical Record Number:  [id number]   Reason for Referral:  stroke follow up  CHIEF COMPLAINT:  No chief complaint on file.   HPI: Diamond [name] is being seen today in the office for left PICA territory acute infarct secondary to newly diagnosed A. fib versus large vessel disease versus steal syndrome on 10/14/2017. History obtained from patient, sister and chart review. Reviewed all radiology images and labs personally.  [name] is a 77 y.o. female with history of a previous stroke, hypertension, hyperlipidemia, coronary artery disease, subclavian steal syndrome, and new onset atrial fibrillation  who presented with vertigo, diaphoresis, nausea and vomiting and presented to [name]. She did not receive IV t-PA due to late presentation.  CT head at [name] showed left inferior cerebellum acute infarct.  MRI head at [name] showed left PICA territory territory infarct with chronic left MCA infarct.  MRA head at [name] showed left VA occlusion and left PICA not visualized.  Patient was found to be in atrial fibrillation with RVR and was transferred to [name] for further evaluation.  Carotid Dopplers were severely abnormal, dampened, preocclusive flow with a possible short segment of occlusive disease at the right distal CCA with retrograde flow in the right ECA along with velocities in the left ICA consistent with 80 to 99% stenosis may be a left subclavian artery to common carotid artery and left vertebral artery were not able to be visualized.  2D echo showed an EF of 65 to 70% without cardiac source of embolus.  LDL 45 and recommended continuation of Lipitor 80 mg daily.  HTN stable during admission and recommended long-term BP goal of 1 30-1 50 due to large vessel stenosis/occlusion.  History of CAD  s/p CABG and was on aspirin and Plavix.  Recommended starting Eliquis for atrial fibrillation and secondary stroke prevention along with continuation of asa for CAD.  Patient did undergo cerebral angiogram which occluded innominate and left subclavian artery with subclavian steal and after consult by vascular surgery no intervention recommended recommended close 6 monthly follow-up carotid ultrasound with vascular surgery.  Patient was discharged to SNF for continued therapies.  11/17/2017 visit: Patient is being seen today for [name] follow-up and is accompanied by her sister.  She denies any recent headaches and states she occasionally has episodes of dizziness but they have been improving.  She is currently still residing at [name] where she is participating in PT/OT and is planning on being discharged tomorrow home as she was to independent to be transferred to SNF.  She is currently residing in a wheelchair but is able to ambulate with use of rolling walker and cane.  Social worker at current facility will be calling her daughter today to update and talk about the plan when she returns home.  She continues to take both aspirin and Eliquis with bruising but no bleeding.  Continues to take Lipitor without side effects myalgias.  Blood pressure today satisfactory at 122/76.  Patient does have follow-up appointment with Dr. Corliss Skains on 11/29/2017.  Has an appointment with cardiology on 11/22/2017.  Sister has concerns about patient returning home as they do not have full-time assistance.  Patient is able to complete all ADLs but does still have some difficulties with IADLs such as cooking, making the bed and doing  laundry.  Recommended family assistance as needed for these particular activities and also recommended home PT/OT for continued improvement.  Also recommended patient obtain life alert button as she continues to have intermittent dizziness episodes and being on Eliquis.  Also recommended for patient to  obtain Rollator walker where she is able to sit if she starts to have a dizziness episode.  Denies new or worsening stroke/TIA symptoms at this time.  Interval history 02/16/2018:   ROS:   14 system review of systems performed and negative with exception of easy bruising, dizziness and spinning sensation  PMH:  Past Medical History:  Diagnosis Date  . Arthritis    "arms, hands, neck, shoulders; some in my knees" (10/13/2017)  . CAD (coronary artery disease)   . CVA (cerebral vascular accident) (HCC) 1995, 2001   /notes 08/11/2010; "weakness in right arm as a result" (10/13/2017)  . History of blood transfusion    "related to OHS"  . History of kidney stones   . Hyperlipidemia   . Hypertension   . New onset a-fib (HCC) 10/13/2017  . Pneumonia    "when I was little"  . Subclavian steal syndrome 2004   LUE/notes 10/13/2017  . TIA (transient ischemic attack) 2003   [name] 08/11/2010  . Vertigo 10/13/2017   ataxia/notes 10/13/2017    PSH:  Past Surgical History:  Procedure Laterality Date  . ABDOMINAL HYSTERECTOMY    . CARDIAC CATHETERIZATION  04/2003   [name] 08/11/2010  . CAROTID ARTERY - SUBCLAVIAN ARTERY BYPASS GRAFT Left 10/2002   carotid subclavian bypass with a 6 mm Dacron graft/notes 08/11/2010   . CARPAL TUNNEL RELEASE Right   . CHOLECYSTECTOMY OPEN    . CORONARY ANGIOPLASTY  04/2002   [name] 08/11/2010  . CORONARY ARTERY BYPASS GRAFT  05/2003   CABG X4/notes 08/11/2010  . CYSTOSCOPY W/ STONE MANIPULATION    . DILATION AND CURETTAGE OF UTERUS    . HERNIA REPAIR    . IR ANGIO INTRA EXTRACRAN SEL COM CAROTID INNOMINATE UNI L MOD SED  10/17/2017  . IR ANGIO INTRA EXTRACRAN SEL COM CAROTID INNOMINATE UNI R MOD SED  10/17/2017  . IR ANGIO VERTEBRAL SEL SUBCLAVIAN INNOMINATE BILAT MOD SED  10/17/2017  . IR ANGIOGRAM EXTREMITY BILATERAL  10/17/2017  . MEDIASTINAL EXPLORATION  05/2003   [name] 08/11/2010  . UMBILICAL HERNIA REPAIR      Social History:  Social History    Socioeconomic History  . Marital status: Widowed    Spouse name: Not on file  . Number of children: Not on file  . Years of education: Not on file  . Highest education level: Not on file  Occupational History  . Not on file  Social Needs  . Financial resource strain: Not on file  . Food insecurity:    Worry: Not on file    Inability: Not on file  . Transportation needs:    Medical: Not on file    Non-medical: Not on file  Tobacco Use  . Smoking status: Former Smoker    Packs/day: 0.50    Years: 25.00    Pack years: 12.50    Types: Cigarettes    Last attempt to quit: 1994    Years since quitting: 25.9  . Smokeless tobacco: Never Used  Substance and Sexual Activity  . Alcohol use: Yes    Frequency: Never    Comment: 10/13/2017 "glass of wine at Christmas"  . Drug use: Never  . Sexual activity: Yes  Lifestyle  .  Physical activity:    Days per week: Not on file    Minutes per session: Not on file  . Stress: Not on file  Relationships  . Social connections:    Talks on phone: Not on file    Gets together: Not on file    Attends religious service: Not on file    Active member of club or organization: Not on file    Attends meetings of clubs or organizations: Not on file    Relationship status: Not on file  . Intimate partner violence:    Fear of current or ex partner: Not on file    Emotionally abused: Not on file    Physically abused: Not on file    Forced sexual activity: Not on file  Other Topics Concern  . Not on file  Social History Narrative  . Not on file    Family History:  Family History  Problem Relation Age of Onset  . Heart disease Father   . Stroke Mother     Medications:   Current Outpatient Medications on File Prior to Visit  Medication Sig Dispense Refill  . acetaminophen (TYLENOL) 500 MG tablet Take 1,000 mg by mouth as needed for mild pain or headache.    . alendronate (FOSAMAX) 70 MG tablet Take 70 mg by mouth once a week.  4  .  apixaban (ELIQUIS) 5 MG TABS tablet Take 1 tablet (5 mg total) by mouth 2 (two) times daily. 180 tablet 2  . aspirin EC 81 MG tablet Take 81 mg by mouth daily.    Marland Kitchen atorvastatin (LIPITOR) 80 MG tablet Take 80 mg by mouth daily.  3  . bethanechol (URECHOLINE) 25 MG tablet Take 25 mg by mouth as needed.    . carvedilol (COREG) 3.125 MG tablet Take 1 tablet (3.125 mg total) by mouth 2 (two) times daily. (Patient not taking: Reported on 12/14/2017) 60 tablet 0  . hydroxypropyl methylcellulose / hypromellose (ISOPTO TEARS / GONIOVISC) 2.5 % ophthalmic solution Place 1 drop into both eyes as needed for dry eyes.    . meclizine (ANTIVERT) 25 MG tablet Take 25 mg by mouth 3 (three) times daily as needed for dizziness.    . ondansetron (ZOFRAN) 4 MG tablet Take 4 mg by mouth every 8 (eight) hours as needed for nausea or vomiting.    . ranitidine (ZANTAC) 150 MG tablet Take 150 mg by mouth 2 (two) times daily.  3   No current facility-administered medications on file prior to visit.     Allergies:   Allergies  Allergen Reactions  . Contrast Media [Iodinated Diagnostic Agents]   . Codeine Other (See Comments)     Physical Exam  There were no vitals filed for this visit. There is no height or weight on file to calculate BMI. No exam data present  General: well developed, well nourished, pleasant elderly Caucasian female, seated, in no evident distress Head: head normocephalic and atraumatic.   Neck: supple with no carotid or supraclavicular bruits Cardiovascular: regular rate and rhythm, no murmurs Musculoskeletal: no deformity Skin:  no rash/petichiae Vascular:  Normal pulses all extremities  Neurologic Exam Mental Status: Awake and fully alert. Oriented to place and time. Recent and remote memory intact. Attention span, concentration and fund of knowledge appropriate. Mood and affect appropriate.  Cranial Nerves: Fundoscopic exam reveals sharp disc margins. Pupils equal, briskly reactive to  light. Extraocular movements full without nystagmus. Visual fields full to confrontation. Hearing intact. Facial sensation intact. Face,  tongue, palate moves normally and symmetrically.  Motor: Normal bulk and tone. 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  Coordination: Rapid alternating movements normal in all extremities. Finger-to-nose and heel-to-shin performed accurately bilaterally. Gait and Station: Patient currently sitting in wheelchair and as she did not bring cane or walker to appointment, gait is deferred Reflexes: 1+ and symmetric. Toes downgoing.    NIHSS  0 Modified Rankin  2 HAS-BLED 2 CHA2DS2-VASc 6   Diagnostic Data (Labs, Imaging, Testing)  CT HEAD WO CONTRAST 10/14/2017 IMPRESSION: 1. Stable distribution of left inferior cerebellum acute infarction. No associated mass effect or hemorrhage. 2. No new acute intracranial abnormality identified. 3. Stable background of chronic microvascular ischemic changes, volume loss, and chronic infarcts of the brain.  MR BRAIN WO CONTRAST [facility]) MRA head/neck [facility]) 10/13/17 IMPRESSION: 1. Acute nonhemorrhagic left posterior inferior cerebellar infarct. 2. Occluded left vertebral artery and PICA corresponding with the infarct. 3. Diffuse medium and small vessel disease without other significant proximal stenosis, aneurysm, or branch vessel occlusion. 4. Remote infarcts involving the left insular cortex, lentiform nucleus, and operculum. 5. Atrophy and diffuse white matter disease is advanced for age. 6. Mild degenerative changes of the cervical spine at C3-4.  ECHOCARDIOGRAM 10/14/17 Impressions: - Hyperdynamic LVEF. Thickened aortic valve leaflets and   significant calcifications in the coronary sinuses, no aortic   stenosis, mild  regurgitation. Mild mitral and moderate tricuspid   regurgitation. PM in place. Mildly dilated ascending aorta   measuring 41 mm.  CEREBRAL ANGIOGRAM 10/17/17 IMPRESSION: Approximately 50-60% stenosis of the left internal carotid artery at the bulb. Angiographically occluded left subclavian artery, with patent left common carotid artery to left subclavian graft with patency of approximately 50%. Subsequent antegrade flow into the left vertebral artery into the posterior fossa is noted. Right subclavian steal from the left vertebral artery with subsequent opacification of the right common carotid artery retrogradely and then antegradely as described above. Angiographically occluded innominate artery. Approximately 50% stenosis of the origin of the left common carotid artery. Severe stenosis of the right common iliac artery at its origin, and distal to this to the mid right common iliac artery.     ASSESSMENT: [name] is a 77 y.o. year old female here with left PICA territory infarct on 10/13/17 secondary to newly diagnosed PAF versus large vessel disease versus dual syndrome. Vascular risk factors include previous stroke, HTN, HLD, CAD, subclavian steal syndrome and new onset PAF.  Patient is being seen today for [name] stroke follow-up and overall continues to do well except for intermittent dizzy episodes.    PLAN: -Continue Eliquis (apixaban) daily  and lipitor  for secondary stroke prevention -F/u with PCP regarding your HLD and HTN management -f/u with Dr. Corliss Skains on 11/29/17 -f/u with cardiology as scheduled on 11/22/17 for PAF on [name] management along with other cardiac conditions -continue to monitor BP at home -Fall prevention -information provided in AVS  -Recommend home PT/OT once returns home -advised to continue to stay active and maintain a healthy diet -Recommend life alert button once patient returns home -Maintain strict control of hypertension with blood  pressure goal below 130/90, diabetes with hemoglobin A1c goal below 6.5% and cholesterol with LDL cholesterol (bad cholesterol) goal below 70 mg/dL. I also advised the patient to eat a healthy diet with plenty of whole grains, cereals, fruits  and vegetables, exercise regularly and maintain ideal body weight.  Follow up in 3 months or call earlier if needed   Greater than 50% of time during this 25 minute visit was spent on counseling,explanation of diagnosis of left PICA territory infarct, reviewing risk factor management of PAF, HTN, HLD, CAD and subclavian steal syndrome, planning of further management, discussion with patient and family and coordination of care    George HughJessica Jennefer Kopp, AGNP-BC  [name], [facility] [address] [place], [place] [id number]  Phone [phone] Fax [phone] Note: This document was prepared with digital dictation and possible smart phrase technology. Any transcriptional errors that result from this process are unintentional.

## 2018-02-28 ENCOUNTER — Encounter: Payer: Self-pay | Admitting: Adult Health

## 2018-04-25 DIAGNOSIS — J0100 Acute maxillary sinusitis, unspecified: Secondary | ICD-10-CM | POA: Diagnosis not present

## 2018-04-25 DIAGNOSIS — I2510 Atherosclerotic heart disease of native coronary artery without angina pectoris: Secondary | ICD-10-CM | POA: Diagnosis not present

## 2018-04-25 DIAGNOSIS — I679 Cerebrovascular disease, unspecified: Secondary | ICD-10-CM | POA: Diagnosis not present

## 2018-04-25 DIAGNOSIS — E785 Hyperlipidemia, unspecified: Secondary | ICD-10-CM | POA: Diagnosis not present

## 2018-04-25 DIAGNOSIS — M810 Age-related osteoporosis without current pathological fracture: Secondary | ICD-10-CM | POA: Diagnosis not present

## 2018-04-25 DIAGNOSIS — I10 Essential (primary) hypertension: Secondary | ICD-10-CM | POA: Diagnosis not present

## 2018-04-26 ENCOUNTER — Encounter: Payer: Self-pay | Admitting: Cardiology

## 2018-06-13 DIAGNOSIS — M810 Age-related osteoporosis without current pathological fracture: Secondary | ICD-10-CM | POA: Diagnosis not present

## 2018-06-13 DIAGNOSIS — I679 Cerebrovascular disease, unspecified: Secondary | ICD-10-CM | POA: Diagnosis not present

## 2018-06-13 DIAGNOSIS — N390 Urinary tract infection, site not specified: Secondary | ICD-10-CM | POA: Diagnosis not present

## 2018-06-13 DIAGNOSIS — I2510 Atherosclerotic heart disease of native coronary artery without angina pectoris: Secondary | ICD-10-CM | POA: Diagnosis not present

## 2018-06-19 DIAGNOSIS — M810 Age-related osteoporosis without current pathological fracture: Secondary | ICD-10-CM | POA: Diagnosis not present

## 2018-06-19 DIAGNOSIS — R600 Localized edema: Secondary | ICD-10-CM | POA: Diagnosis not present

## 2018-06-19 DIAGNOSIS — I2510 Atherosclerotic heart disease of native coronary artery without angina pectoris: Secondary | ICD-10-CM | POA: Diagnosis not present

## 2018-06-19 DIAGNOSIS — I679 Cerebrovascular disease, unspecified: Secondary | ICD-10-CM | POA: Diagnosis not present

## 2018-07-18 ENCOUNTER — Telehealth: Payer: Self-pay | Admitting: Cardiology

## 2018-07-18 NOTE — Telephone Encounter (Signed)
Patient reporting swelling in mouth, tongue, difficulty swallowing. She's been noticing a small amount of bleeding when she wipes after urinating and sometimes the bleeding is so much it looks like she's having a period. She reports a lot of edema to legs and feet, does not weigh herself so doesn't know if she's gained weight. She believes she started eliquis last September and thinks this is a reaction to it. She is taking amlodipine, lipitor and ASA as ordered. Carvedilol dose she reports taking is 6.25 mg BID. Chart says 3.125 mg BID is ordered. The only new medicine she's taken recently was antibiotics for a sinus infection 7-8 weeks ago. She is a bit short of breath due to allergies. She stopped her Eliquis last night.  pls advise, tx

## 2018-07-18 NOTE — Telephone Encounter (Signed)
She needs to call her primary care physician as soon as possible to report the symptoms.  Because of her swelling in the mouth and tongue if she has any problems with breathing she needs to go to the nearest emergency room.  Please let her know that ASAP.  Also give her a follow-up call in the morning to see how she is doing and let me know.  Because of the hematuria she can hold the Eliquis for now.  Please put her for an appointment with me on Thursday by tele-visit

## 2018-07-18 NOTE — Telephone Encounter (Signed)
Patient called to say she cannot be on Eloquis anymore, it is causing too many issues! Please advise.

## 2018-07-18 NOTE — Telephone Encounter (Signed)
Phoned patient and told to stop Eliquis. Told to go to ER if she develops any trouble breathing. Instructed to see her PCP as soon as possible. Reports already having PCP appt next Wednesday. Scheduled televisit with Dr. Tomie China Thursday morning. She does not have a computer or smart phone, so it will be a phone visit. Told patient I will call to check on her in the morning.  Patient verbalizes understanding of the above. No additional questions or concerns

## 2018-07-19 ENCOUNTER — Telehealth: Payer: Self-pay

## 2018-07-19 NOTE — Telephone Encounter (Signed)
Patient states her swelling has gone down in legs and lips, she is not dizzy anymore. She has a televisit appt with Dr.RRR and states she has no urgent needs at the moment her BP 135/60 this morning. She is in good spirits. RN expressed that if something changes to notify office and seek immediate medical attention. She was in agreement with no further questions.

## 2018-07-19 NOTE — Telephone Encounter (Signed)
See note bleow

## 2018-07-20 ENCOUNTER — Other Ambulatory Visit: Payer: Self-pay

## 2018-07-20 ENCOUNTER — Telehealth (INDEPENDENT_AMBULATORY_CARE_PROVIDER_SITE_OTHER): Payer: Medicare Other | Admitting: Cardiology

## 2018-07-20 ENCOUNTER — Encounter: Payer: Self-pay | Admitting: Cardiology

## 2018-07-20 ENCOUNTER — Telehealth: Payer: Self-pay | Admitting: Cardiology

## 2018-07-20 VITALS — BP 135/70 | Ht 61.0 in | Wt 158.0 lb

## 2018-07-20 DIAGNOSIS — E782 Mixed hyperlipidemia: Secondary | ICD-10-CM

## 2018-07-20 DIAGNOSIS — Z951 Presence of aortocoronary bypass graft: Secondary | ICD-10-CM

## 2018-07-20 DIAGNOSIS — I480 Paroxysmal atrial fibrillation: Secondary | ICD-10-CM

## 2018-07-20 DIAGNOSIS — I10 Essential (primary) hypertension: Secondary | ICD-10-CM

## 2018-07-20 DIAGNOSIS — I2510 Atherosclerotic heart disease of native coronary artery without angina pectoris: Secondary | ICD-10-CM | POA: Diagnosis not present

## 2018-07-20 DIAGNOSIS — Z8673 Personal history of transient ischemic attack (TIA), and cerebral infarction without residual deficits: Secondary | ICD-10-CM

## 2018-07-20 NOTE — Progress Notes (Signed)
Virtual Visit via Telephone Note   This visit type was conducted due to national recommendations for restrictions regarding the COVID-19 Pandemic (e.g. social distancing) in an effort to limit this patient's exposure and mitigate transmission in our community.  Due to her co-morbid illnesses, this patient is at least at moderate risk for complications without adequate follow up.  This format is felt to be most appropriate for this patient at this time.  The patient did not have access to video technology/had technical difficulties with video requiring transitioning to audio format only (telephone).  All issues noted in this document were discussed and addressed.  No physical exam could be performed with this format.  Please refer to the patient's chart for her  consent to telehealth for [facility].   Evaluation Performed:  Follow-up visit  Date:  07/20/2018   ID:  Diamond [name], Diamond [name] 1940/06/12, MRN [id number]  Patient Location: Home Provider Location: Home  PCP:  Simone Curia, MD  Cardiologist:  No primary care provider on file.  Electrophysiologist:  None   Chief Complaint: Shortness of breath, dysphagia and hematuri  History of Present Illness:    Diamond [name] is a 78 y.o. female with past medical history of atrial fibrillation with history of stroke on anticoagulation.  She also history of essential hypertension.  She called her office mentioning that she had hematuria and we told her to stop her anticoagulation because of significant hematuria.  The patient also had some shortness of breath and dysphagia.  She does not understand why this happened.  At any rate she has discontinued her Eliquis.  At the time of my evaluation by phone she tells me that she is completely fine and those symptoms happened about 4 days ago and subsequently she has had no such symptoms.  She has been taking Eliquis for the past several months.  At the time of my evaluation, the patient is alert awake oriented  and in no distress.  The patient does not have symptoms concerning for COVID-19 infection (fever, chills, cough, or new shortness of breath).    Past Medical History:  Diagnosis Date   Arthritis    "arms, hands, neck, shoulders; some in my knees" (10/13/2017)   CAD (coronary artery disease)    CVA (cerebral vascular accident) (HCC) 1995, 2001   /notes 08/11/2010; "weakness in right arm as a result" (10/13/2017)   History of blood transfusion    "related to OHS"   History of kidney stones    Hyperlipidemia    Hypertension    New onset a-fib (HCC) 10/13/2017   Pneumonia    "when I was little"   Subclavian steal syndrome 2004   LUE/notes 10/13/2017   TIA (transient ischemic attack) 2003   /notes 08/11/2010   Vertigo 10/13/2017   ataxia/notes 10/13/2017   Past Surgical History:  Procedure Laterality Date   ABDOMINAL HYSTERECTOMY     CARDIAC CATHETERIZATION  04/2003   [name] 08/11/2010   CAROTID ARTERY - SUBCLAVIAN ARTERY BYPASS GRAFT Left 10/2002   carotid subclavian bypass with a 6 mm Dacron graft/notes 08/11/2010    CARPAL TUNNEL RELEASE Right    CHOLECYSTECTOMY OPEN     CORONARY ANGIOPLASTY  04/2002   [name] 08/11/2010   CORONARY ARTERY BYPASS GRAFT  05/2003   CABG X4/notes 08/11/2010   CYSTOSCOPY W/ STONE MANIPULATION     DILATION AND CURETTAGE OF UTERUS     HERNIA REPAIR     IR ANGIO INTRA EXTRACRAN SEL COM CAROTID  INNOMINATE UNI L MOD SED  10/17/2017   IR ANGIO INTRA EXTRACRAN SEL COM CAROTID INNOMINATE UNI R MOD SED  10/17/2017   IR ANGIO VERTEBRAL SEL SUBCLAVIAN INNOMINATE BILAT MOD SED  10/17/2017   IR ANGIOGRAM EXTREMITY BILATERAL  10/17/2017   MEDIASTINAL EXPLORATION  05/2003   [name]/notes 08/11/2010   UMBILICAL HERNIA REPAIR       Current Meds  Medication Sig   alendronate (FOSAMAX) 70 MG tablet Take 70 mg by mouth once a week.   amLODipine (NORVASC) 5 MG tablet Take 5 mg by mouth daily.   aspirin EC 81 MG tablet Take 81 mg by mouth daily.     atorvastatin (LIPITOR) 80 MG tablet Take 80 mg by mouth daily.   carvedilol (COREG) 3.125 MG tablet Take 1 tablet (3.125 mg total) by mouth 2 (two) times daily.   hydroxypropyl methylcellulose / hypromellose (ISOPTO TEARS / GONIOVISC) 2.5 % ophthalmic solution Place 1 drop into both eyes as needed for dry eyes.   meclizine (ANTIVERT) 25 MG tablet Take 25 mg by mouth 3 (three) times daily as needed for dizziness.   ondansetron (ZOFRAN) 4 MG tablet Take 4 mg by mouth every 8 (eight) hours as needed for nausea or vomiting.   ranitidine (ZANTAC) 150 MG tablet Take 150 mg by mouth 2 (two) times daily.     Allergies:   Contrast media [iodinated diagnostic agents] and Codeine   Social History   Tobacco Use   Smoking status: Former Smoker    Packs/day: 0.50    Years: 25.00    Pack years: 12.50    Types: Cigarettes    Last attempt to quit: 1994    Years since quitting: 26.3   Smokeless tobacco: Never Used  Substance Use Topics   Alcohol use: Yes    Frequency: Never    Comment: 10/13/2017 "glass of wine at Christmas"   Drug use: Never     Family Hx: The patient's family history includes Heart disease in her father; Stroke in her mother.  ROS:   Please see the history of present illness.    At this time no chest pain orthopnea or PND All other systems reviewed and are negative.   Prior CV studies:   The following studies were reviewed today:  None  Labs/Other Tests and Data Reviewed:    EKG:  No ECG reviewed.  Recent Labs: 10/13/2017: ALT 18 10/17/2017: Hemoglobin 11.4; Platelets 294 10/18/2017: BUN 18; Creatinine, Ser 0.63; Potassium 4.3; Sodium 129   Recent Lipid Panel Lab Results  Component Value Date/Time   CHOL 120 10/14/2017 02:30 AM   TRIG 39 10/14/2017 02:30 AM   HDL 67 10/14/2017 02:30 AM   CHOLHDL 1.8 10/14/2017 02:30 AM   LDLCALC 45 10/14/2017 02:30 AM    Wt Readings from Last 3 Encounters:  07/20/18 158 lb (71.7 kg)  12/14/17 166 lb 6.4 oz  (75.5 kg)  11/17/17 159 lb (72.1 kg)     Objective:    Vital Signs:  BP 135/70 (BP Location: Left Arm, Patient Position: Sitting, Cuff Size: Normal)    Ht 5\' 1"  (1.549 m)    Wt 158 lb (71.7 kg)    BMI 29.85 kg/m    VITAL SIGNS:  reviewed  ASSESSMENT & PLAN:    1. Atrial fibrillation and history of stroke: The patient's heart rate is overall well controlled.  She gives history of significant symptoms.  She has had significant hematuria which has resolved after stopping Eliquis.  She tells me  that she had history suggesting of some shortness of breath and difficulty swallowing and she does not know why this happened.  I requested her to come to our office to get blood work to make sure her hemoglobin is fine.  I also told her that we will give her samples of Xarelto so that she can initiate it and see how she tolerates it as far as her breathing and hematuria is concerned.  The patient mentions to me that she has an appointment with her primary care physician Dr. Nedra Hai on Wednesday.  She says because of logistics she cannot come to our office and she cannot see a physician till Wednesday because she does not have a ride.  I requested her to see if she could get any help and she said she did not feel that she would get any help for her right.  The risks of being off anticoagulation in terms of thromboembolism were explained to the patient and she vocalized understanding.  She mentions to me that she will see Dr. Nedra Hai on Wednesday and get all her blood work and also consider Xarelto replacement for Eliquis at that time. 2. She will be seen in follow-up appointment in 3 weeks or earlier if she has any concerns.  I told her to go to the nearest emergency room for any significant concerns.  She has already spoken to her primary care physician's office about her hematuria issues.  They have resolved now.  Patient had multiple questions which were answered to her satisfaction.  COVID-19 Education: The signs and  symptoms of COVID-19 were discussed with the patient and how to seek care for testing (follow up with PCP or arrange E-visit).  The importance of social distancing was discussed today.  Time:   Today, I have spent 14 minutes with the patient with telehealth technology discussing the above problems.     Medication Adjustments/Labs and Tests Ordered: Current medicines are reviewed at length with the patient today.  Concerns regarding medicines are outlined above.   Tests Ordered: No orders of the defined types were placed in this encounter.   Medication Changes: No orders of the defined types were placed in this encounter.   Disposition:  Follow up in 3 week(s)  Signed, Garwin Brothers, MD  07/20/2018 9:57 AM    [facility]

## 2018-07-20 NOTE — Telephone Encounter (Signed)
Virtual Visit Pre-Appointment Phone Call  "(Name), I am calling you today to discuss your upcoming appointment. We are currently trying to limit exposure to the virus that causes COVID-19 by seeing patients at home rather than in the office."  1. "What is the BEST phone number to call the day of the visit?" - include this in appointment notes  2. Do you have or have access to (through a family member/friend) a smartphone with video capability that we can use for your visit?" a. If yes - list this number in appt notes as cell (if different from BEST phone #) and list the appointment type as a VIDEO visit in appointment notes b. If no - list the appointment type as a PHONE visit in appointment notes  3. Confirm consent - "In the setting of the current Covid19 crisis, you are scheduled for a (phone or video) visit with your provider on (date) at (time).  Just as we do with many in-office visits, in order for you to participate in this visit, we must obtain consent.  If you'd like, I can send this to your mychart (if signed up) or email for you to review.  Otherwise, I can obtain your verbal consent now.  All virtual visits are billed to your insurance company just like a normal visit would be.  By agreeing to a virtual visit, we'd like you to understand that the technology does not allow for your provider to perform an examination, and thus may limit your provider's ability to fully assess your condition. If your provider identifies any concerns that need to be evaluated in person, we will make arrangements to do so.  Finally, though the technology is pretty good, we cannot assure that it will always work on either your or our end, and in the setting of a video visit, we may have to convert it to a phone-only visit.  In either situation, we cannot ensure that we have a secure connection.  Are you willing to proceed?" STAFF: Did the patient verbally acknowledge consent to telehealth visit? Document  YES/NO here: YES  4. Advise patient to be prepared - "Two hours prior to your appointment, go ahead and check your blood pressure, pulse, oxygen saturation, and your weight (if you have the equipment to check those) and write them all down. When your visit starts, your provider will ask you for this information. If you have an Apple Watch or Kardia device, please plan to have heart rate information ready on the day of your appointment. Please have a pen and paper handy nearby the day of the visit as well."  5. Give patient instructions for MyChart download to smartphone OR [email or web address] as below if video visit (depending on what platform provider is using)  6. Inform patient they will receive a phone call 15 minutes prior to their appointment time (may be from unknown caller ID) so they should be prepared to answer    TELEPHONE CALL NOTE  Diamond [name] has been deemed a candidate for a follow-up tele-health visit to limit community exposure during the Covid-19 pandemic. I spoke with the patient via phone to ensure availability of phone/video source, confirm preferred email & phone number, and discuss instructions and expectations.  I reminded Diamond [name] to be prepared with any vital sign and/or heart rhythm information that could potentially be obtained via home monitoring, at the time of her visit. I reminded Diamond [name] to expect a phone call prior to  her visit.  Minette Brine 07/20/2018 9:01 AM   INSTRUCTIONS FOR DOWNLOADING THE MYCHART APP TO SMARTPHONE  - The patient must first make sure to have activated MyChart and know their login information - If Apple, go to [employer] and type in MyChart in the search bar and download the app. If Android, ask patient to go to [employer] and type in [place] in the search bar and download the app. The app is free but as with any other app downloads, their phone may require them to verify saved payment information or Apple/Android password.  -  The patient will need to then log into the app with their MyChart username and password, and select [facility] as their healthcare provider to link the account. When it is time for your visit, go to the MyChart app, find appointments, and click Begin Video Visit. Be sure to Select Allow for your device to access the Microphone and Camera for your visit. You will then be connected, and your provider will be with you shortly.  **If they have any issues connecting, or need assistance please contact MyChart service desk [phone])**  **If using a computer, in order to ensure the best quality for their visit they will need to use either of the following Internet Browsers: [employer] or Google Chrome**  IF USING DOXIMITY or DOXY.ME - The patient will receive a link just prior to their visit by text.     FULL LENGTH CONSENT FOR TELE-HEALTH VISIT   I hereby voluntarily request, consent and authorize [facility] and its employed or contracted physicians, physician assistants, nurse practitioners or other licensed health care professionals (the Practitioner), to provide me with telemedicine health care services (the Services") as deemed necessary by the treating Practitioner. I acknowledge and consent to receive the Services by the Practitioner via telemedicine. I understand that the telemedicine visit will involve communicating with the Practitioner through live audiovisual communication technology and the disclosure of certain medical information by electronic transmission. I acknowledge that I have been given the opportunity to request an in-person assessment or other available alternative prior to the telemedicine visit and am voluntarily participating in the telemedicine visit.  I understand that I have the right to withhold or withdraw my consent to the use of telemedicine in the course of my care at any time, without affecting my right to future care or treatment, and that the  Practitioner or I may terminate the telemedicine visit at any time. I understand that I have the right to inspect all information obtained and/or recorded in the course of the telemedicine visit and may receive copies of available information for a reasonable fee.  I understand that some of the potential risks of receiving the Services via telemedicine include:   Delay or interruption in medical evaluation due to technological equipment failure or disruption;  Information transmitted may not be sufficient (e.g. poor resolution of images) to allow for appropriate medical decision making by the Practitioner; and/or   In rare instances, security protocols could fail, causing a breach of personal health information.  Furthermore, I acknowledge that it is my responsibility to provide information about my medical history, conditions and care that is complete and accurate to the best of my ability. I acknowledge that Practitioner's advice, recommendations, and/or decision may be based on factors not within their control, such as incomplete or inaccurate data provided by me or distortions of diagnostic images or specimens that may result from electronic transmissions. I  understand that the practice of medicine is not an exact science and that Practitioner makes no warranties or guarantees regarding treatment outcomes. I acknowledge that I will receive a copy of this consent concurrently upon execution via email to the email address I last provided but may also request a printed copy by calling the office of [facility].    I understand that my insurance will be billed for this visit.   I have read or had this consent read to me.  I understand the contents of this consent, which adequately explains the benefits and risks of the Services being provided via telemedicine.   I have been provided ample opportunity to ask questions regarding this consent and the Services and have had my questions answered to my  satisfaction.  I give my informed consent for the services to be provided through the use of telemedicine in my medical care  By participating in this telemedicine visit I agree to the above.

## 2018-07-20 NOTE — Patient Instructions (Signed)
Medication Instructions:  Your physician recommends that you continue on your current medications as directed. Please refer to the Current Medication list given to you today.  If you need a refill on your cardiac medications before your next appointment, please call your pharmacy.   Lab work: NONE If you have labs (blood work) drawn today and your tests are completely normal, you will receive your results only by: . MyChart Message (if you have MyChart) OR . A paper copy in the mail If you have any lab test that is abnormal or we need to change your treatment, we will call you to review the results.  Testing/Procedures: NONE  Follow-Up: At [facility], you and your health needs are our priority.  As part of our continuing mission to provide you with exceptional heart care, we have created designated Provider Care Teams.  These Care Teams include your primary Cardiologist (physician) and Advanced Practice Providers (APPs -  Physician Assistants and Nurse Practitioners) who all work together to provide you with the care you need, when you need it. You will need a follow up appointment in 3 weeks.   

## 2018-07-26 DIAGNOSIS — M810 Age-related osteoporosis without current pathological fracture: Secondary | ICD-10-CM | POA: Diagnosis not present

## 2018-07-26 DIAGNOSIS — E785 Hyperlipidemia, unspecified: Secondary | ICD-10-CM | POA: Diagnosis not present

## 2018-07-26 DIAGNOSIS — I2510 Atherosclerotic heart disease of native coronary artery without angina pectoris: Secondary | ICD-10-CM | POA: Diagnosis not present

## 2018-07-26 DIAGNOSIS — I10 Essential (primary) hypertension: Secondary | ICD-10-CM | POA: Diagnosis not present

## 2018-07-26 DIAGNOSIS — N390 Urinary tract infection, site not specified: Secondary | ICD-10-CM | POA: Diagnosis not present

## 2018-07-26 DIAGNOSIS — I679 Cerebrovascular disease, unspecified: Secondary | ICD-10-CM | POA: Diagnosis not present

## 2018-07-27 ENCOUNTER — Encounter: Payer: Self-pay | Admitting: Cardiology

## 2018-07-27 ENCOUNTER — Telehealth: Payer: Self-pay | Admitting: *Deleted

## 2018-07-27 ENCOUNTER — Other Ambulatory Visit: Payer: Self-pay

## 2018-07-27 ENCOUNTER — Telehealth (INDEPENDENT_AMBULATORY_CARE_PROVIDER_SITE_OTHER): Payer: Medicare Other | Admitting: Cardiology

## 2018-07-27 VITALS — BP 130/85 | Ht 61.0 in | Wt 165.0 lb

## 2018-07-27 DIAGNOSIS — I10 Essential (primary) hypertension: Secondary | ICD-10-CM

## 2018-07-27 DIAGNOSIS — I480 Paroxysmal atrial fibrillation: Secondary | ICD-10-CM

## 2018-07-27 DIAGNOSIS — Z8673 Personal history of transient ischemic attack (TIA), and cerebral infarction without residual deficits: Secondary | ICD-10-CM

## 2018-07-27 DIAGNOSIS — Z951 Presence of aortocoronary bypass graft: Secondary | ICD-10-CM

## 2018-07-27 DIAGNOSIS — I2510 Atherosclerotic heart disease of native coronary artery without angina pectoris: Secondary | ICD-10-CM | POA: Diagnosis not present

## 2018-07-27 DIAGNOSIS — E782 Mixed hyperlipidemia: Secondary | ICD-10-CM

## 2018-07-27 MED ORDER — APIXABAN 5 MG PO TABS: 5.0000 mg | ORAL_TABLET | Freq: Two times a day (BID) | ORAL | 3 refills | Status: DC

## 2018-07-27 NOTE — Progress Notes (Signed)
Virtual Visit via Telephone Note   This visit type was conducted due to national recommendations for restrictions regarding the COVID-19 Pandemic (e.g. social distancing) in an effort to limit this patient's exposure and mitigate transmission in our community.  Due to her co-morbid illnesses, this patient is at least at moderate risk for complications without adequate follow up.  This format is felt to be most appropriate for this patient at this time.  The patient did not have access to video technology/had technical difficulties with video requiring transitioning to audio format only (telephone).  All issues noted in this document were discussed and addressed.  No physical exam could be performed with this format.  Please refer to the patient's chart for her  consent to telehealth for [facility].   Evaluation Performed:  Follow-up visit  Date:  07/27/2018   ID:  Diamond, [name] 10/27/1940, MRN [id number]  Patient Location: Home Provider Location: Home  PCP:  Simone Curia, MD  Cardiologist:  No primary care provider on file.  Electrophysiologist:  None   Chief Complaint: Atrial fibrillation  History of Present Illness:    Diamond [name] is a 78 y.o. female with past medical history of atrial fibrillation, essential hypertension and stroke.  She had significant hematuria and so she had stopped anticoagulation.  She saw her primary care physician yesterday described her a course of antibiotic and told her that she should start anticoagulation.  The patient has not started it yet.  No chest pain orthopnea or PND.  At the time of my evaluation, the patient is alert awake oriented and in no distress.  The patient does not have symptoms concerning for COVID-19 infection (fever, chills, cough, or new shortness of breath).    Past Medical History:  Diagnosis Date  . Arthritis    "arms, hands, neck, shoulders; some in my knees" (10/13/2017)  . CAD (coronary artery disease)   . CVA (cerebral  vascular accident) (HCC) 1995, 2001   /notes 08/11/2010; "weakness in right arm as a result" (10/13/2017)  . History of blood transfusion    "related to OHS"  . History of kidney stones   . Hyperlipidemia   . Hypertension   . New onset a-fib (HCC) 10/13/2017  . Pneumonia    "when I was little"  . Subclavian steal syndrome 2004   LUE/notes 10/13/2017  . TIA (transient ischemic attack) 2003   [name] 08/11/2010  . Vertigo 10/13/2017   ataxia/notes 10/13/2017   Past Surgical History:  Procedure Laterality Date  . ABDOMINAL HYSTERECTOMY    . CARDIAC CATHETERIZATION  04/2003   [name] 08/11/2010  . CAROTID ARTERY - SUBCLAVIAN ARTERY BYPASS GRAFT Left 10/2002   carotid subclavian bypass with a 6 mm Dacron graft/notes 08/11/2010   . CARPAL TUNNEL RELEASE Right   . CHOLECYSTECTOMY OPEN    . CORONARY ANGIOPLASTY  04/2002   [name] 08/11/2010  . CORONARY ARTERY BYPASS GRAFT  05/2003   CABG X4/notes 08/11/2010  . CYSTOSCOPY W/ STONE MANIPULATION    . DILATION AND CURETTAGE OF UTERUS    . HERNIA REPAIR    . IR ANGIO INTRA EXTRACRAN SEL COM CAROTID INNOMINATE UNI L MOD SED  10/17/2017  . IR ANGIO INTRA EXTRACRAN SEL COM CAROTID INNOMINATE UNI R MOD SED  10/17/2017  . IR ANGIO VERTEBRAL SEL SUBCLAVIAN INNOMINATE BILAT MOD SED  10/17/2017  . IR ANGIOGRAM EXTREMITY BILATERAL  10/17/2017  . MEDIASTINAL EXPLORATION  05/2003   [name] 08/11/2010  . UMBILICAL HERNIA REPAIR  Current Meds  Medication Sig  . alendronate (FOSAMAX) 70 MG tablet Take 70 mg by mouth once a week.  . ALPRAZolam (XANAX) 0.5 MG tablet Take 1 tablet by mouth as needed.  Marland Kitchen. amLODipine (NORVASC) 5 MG tablet Take 5 mg by mouth daily.  Marland Kitchen. aspirin EC 81 MG tablet Take 81 mg by mouth daily.  Marland Kitchen. atorvastatin (LIPITOR) 80 MG tablet Take 80 mg by mouth daily.  . carvedilol (COREG) 3.125 MG tablet Take 1 tablet (3.125 mg total) by mouth 2 (two) times daily.  . hydroxypropyl methylcellulose / hypromellose (ISOPTO TEARS / GONIOVISC) 2.5 %  ophthalmic solution Place 1 drop into both eyes as needed for dry eyes.  . meclizine (ANTIVERT) 25 MG tablet Take 25 mg by mouth 3 (three) times daily as needed for dizziness.  . meloxicam (MOBIC) 15 MG tablet Take 1 tablet by mouth as needed.  . ondansetron (ZOFRAN) 4 MG tablet Take 4 mg by mouth every 8 (eight) hours as needed for nausea or vomiting.  . ranitidine (ZANTAC) 150 MG tablet Take 150 mg by mouth 2 (two) times daily.     Allergies:   Contrast media [iodinated diagnostic agents] and Codeine   Social History   Tobacco Use  . Smoking status: Former Smoker    Packs/day: 0.50    Years: 25.00    Pack years: 12.50    Types: Cigarettes    Last attempt to quit: 1994    Years since quitting: 26.3  . Smokeless tobacco: Never Used  Substance Use Topics  . Alcohol use: Yes    Frequency: Never    Comment: 10/13/2017 "glass of wine at Christmas"  . Drug use: Never     Family Hx: The patient's family history includes Heart disease in her father; Stroke in her mother.  ROS:   Please see the history of present illness.    As mentioned above All other systems reviewed and are negative.   Prior CV studies:   The following studies were reviewed today:  None I am awaiting for blood work report from primary care doctor's office  Labs/Other Tests and Data Reviewed:    EKG:  No ECG reviewed.  Recent Labs: 10/13/2017: ALT 18 10/17/2017: Hemoglobin 11.4; Platelets 294 10/18/2017: BUN 18; Creatinine, Ser 0.63; Potassium 4.3; Sodium 129   Recent Lipid Panel Lab Results  Component Value Date/Time   CHOL 120 10/14/2017 02:30 AM   TRIG 39 10/14/2017 02:30 AM   HDL 67 10/14/2017 02:30 AM   CHOLHDL 1.8 10/14/2017 02:30 AM   LDLCALC 45 10/14/2017 02:30 AM    Wt Readings from Last 3 Encounters:  07/27/18 165 lb (74.8 kg)  07/20/18 158 lb (71.7 kg)  12/14/17 166 lb 6.4 oz (75.5 kg)     Objective:    Vital Signs:  BP 130/85 (BP Location: Left Arm, Patient Position: Sitting,  Cuff Size: Normal)   Ht 5\' 1"  (1.549 m)   Wt 165 lb (74.8 kg)   BMI 31.18 kg/m    VITAL SIGNS:  reviewed  ASSESSMENT & PLAN:    1. 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 Management and therapy including rate and rhythm control, anticoagulation benefits and potential risks were discussed extensively with the patient. Patient had multiple questions which were answered to patient's satisfaction. 2. The patient at this point does not have hematuria is been several days since her hematuria has subsided.  The patient has been advised by primary care  to reinitiate anticoagulation and I am fully in agreement with that.  I gave the patient an option of changing to other medications such as Xarelto but she declined it for financial reasons.  She tells me that she is used Eliquis for a long period of time and will continue it.  She will start her dose beginning today I told her there is no reason for her to wait.  She will give [place] a call if she has any significant side effects.  If it is something very concerning and urgent she knows to go to the nearest emergency room. 3. She has an appointment with her primary care physician in the next week.  Also I will await blood work reports. 4. She will be seen in follow-up appointment in a month or earlier if she has any concerns.  COVID-19 Education: The signs and symptoms of COVID-19 were discussed with the patient and how to seek care for testing (follow up with PCP or arrange E-visit).  The importance of social distancing was discussed today.  Time:   Today, I have spent 17 minutes with the patient with telehealth technology discussing the above problems.     Medication Adjustments/Labs and Tests Ordered: Current medicines are reviewed at length with the patient today.  Concerns regarding medicines are outlined above.   Tests Ordered: No orders of the defined types  were placed in this encounter.   Medication Changes: No orders of the defined types were placed in this encounter.   Disposition:  Follow up in 1 month(s)  Signed, Garwin Brothers, MD  07/27/2018 2:16 PM    [facility]

## 2018-07-27 NOTE — Patient Instructions (Addendum)
Medication Instructions:  RESTART eliquis 5 mg (1 tablet) twice per day HOLD aspirin 81 mg (1 tablet) daily for two weeks   If you need a refill on your cardiac medications before your next appointment, please call your pharmacy.   Lab work: NONE If you have labs (blood work) drawn today and your tests are completely normal, you will receive your results only by: Marland Kitchen MyChart Message (if you have MyChart) OR . A paper copy in the mail If you have any lab test that is abnormal or we need to change your treatment, we will call you to review the results.  Testing/Procedures:NONE  Follow-Up: At [facility], you and your health needs are our priority.  As part of our continuing mission to provide you with exceptional heart care, we have created designated Provider Care Teams.  These Care Teams include your primary Cardiologist (physician) and Advanced Practice Providers (APPs -  Physician Assistants and Nurse Practitioners) who all work together to provide you with the care you need, when you need it. You will need a follow up appointment in 1 months.

## 2018-07-27 NOTE — Telephone Encounter (Signed)
Patient called with concerns about anticoag and thought her appt was today. Added to schedule for televisit

## 2018-07-27 NOTE — Telephone Encounter (Signed)
Pt needs to know about blood thinner. Thought her appt was today and upset it isn't for another 2 weeks. She is anxious about getting back on a blood thinner. Please advise.

## 2018-07-27 NOTE — Addendum Note (Signed)
Addended by: Pamala Hurry on: 07/27/2018 03:54 PM   Modules accepted: Orders

## 2018-08-03 DIAGNOSIS — I2510 Atherosclerotic heart disease of native coronary artery without angina pectoris: Secondary | ICD-10-CM | POA: Diagnosis not present

## 2018-08-03 DIAGNOSIS — M810 Age-related osteoporosis without current pathological fracture: Secondary | ICD-10-CM | POA: Diagnosis not present

## 2018-08-03 DIAGNOSIS — K219 Gastro-esophageal reflux disease without esophagitis: Secondary | ICD-10-CM | POA: Diagnosis not present

## 2018-08-03 DIAGNOSIS — I679 Cerebrovascular disease, unspecified: Secondary | ICD-10-CM | POA: Diagnosis not present

## 2018-08-04 DIAGNOSIS — N959 Unspecified menopausal and perimenopausal disorder: Secondary | ICD-10-CM | POA: Diagnosis not present

## 2018-08-04 DIAGNOSIS — E785 Hyperlipidemia, unspecified: Secondary | ICD-10-CM | POA: Diagnosis not present

## 2018-08-04 DIAGNOSIS — Z9181 History of falling: Secondary | ICD-10-CM | POA: Diagnosis not present

## 2018-08-04 DIAGNOSIS — Z1231 Encounter for screening mammogram for malignant neoplasm of breast: Secondary | ICD-10-CM | POA: Diagnosis not present

## 2018-08-04 DIAGNOSIS — Z0000 Encounter for general adult medical examination without abnormal findings: Secondary | ICD-10-CM | POA: Diagnosis not present

## 2018-08-04 DIAGNOSIS — Z1331 Encounter for screening for depression: Secondary | ICD-10-CM | POA: Diagnosis not present

## 2018-08-04 DIAGNOSIS — Z6830 Body mass index (BMI) 30.0-30.9, adult: Secondary | ICD-10-CM | POA: Diagnosis not present

## 2018-08-14 ENCOUNTER — Other Ambulatory Visit: Payer: Self-pay

## 2018-08-14 ENCOUNTER — Encounter: Payer: Self-pay | Admitting: Cardiology

## 2018-08-14 ENCOUNTER — Telehealth (INDEPENDENT_AMBULATORY_CARE_PROVIDER_SITE_OTHER): Payer: Medicare Other | Admitting: Cardiology

## 2018-08-14 VITALS — BP 135/80 | Ht 61.5 in | Wt 159.0 lb

## 2018-08-14 DIAGNOSIS — I10 Essential (primary) hypertension: Secondary | ICD-10-CM

## 2018-08-14 DIAGNOSIS — Z951 Presence of aortocoronary bypass graft: Secondary | ICD-10-CM

## 2018-08-14 DIAGNOSIS — I639 Cerebral infarction, unspecified: Secondary | ICD-10-CM

## 2018-08-14 DIAGNOSIS — I2510 Atherosclerotic heart disease of native coronary artery without angina pectoris: Secondary | ICD-10-CM

## 2018-08-14 DIAGNOSIS — I480 Paroxysmal atrial fibrillation: Secondary | ICD-10-CM

## 2018-08-14 DIAGNOSIS — Z8673 Personal history of transient ischemic attack (TIA), and cerebral infarction without residual deficits: Secondary | ICD-10-CM

## 2018-08-14 DIAGNOSIS — E782 Mixed hyperlipidemia: Secondary | ICD-10-CM

## 2018-08-14 NOTE — Patient Instructions (Signed)
Medication Instructions:  Your physician recommends that you continue on your current medications as directed. Please refer to the Current Medication list given to you today.  If you need a refill on your cardiac medications before your next appointment, please call your pharmacy.   Lab work: NONE If you have labs (blood work) drawn today and your tests are completely normal, you will receive your results only by: . MyChart Message (if you have MyChart) OR . A paper copy in the mail If you have any lab test that is abnormal or we need to change your treatment, we will call you to review the results.  Testing/Procedures: NONE  Follow-Up: At [facility], you and your health needs are our priority.  As part of our continuing mission to provide you with exceptional heart care, we have created designated Provider Care Teams.  These Care Teams include your primary Cardiologist (physician) and Advanced Practice Providers (APPs -  Physician Assistants and Nurse Practitioners) who all work together to provide you with the care you need, when you need it. You will need a follow up appointment in 1 months.    

## 2018-08-14 NOTE — Progress Notes (Signed)
Virtual Visit via Telephone Note   This visit type was conducted due to national recommendations for restrictions regarding the COVID-19 Pandemic (e.g. social distancing) in an effort to limit this patient's exposure and mitigate transmission in our community.  Due to her co-morbid illnesses, this patient is at least at moderate risk for complications without adequate follow up.  This format is felt to be most appropriate for this patient at this time.  The patient did not have access to video technology/had technical difficulties with video requiring transitioning to audio format only (telephone).  All issues noted in this document were discussed and addressed.  No physical exam could be performed with this format.  Please refer to the patient's chart for her  consent to telehealth for [facility].   Date:  08/14/2018   ID:  Diamond [name], DOB [birth year], MRN [id number]  Patient Location: Home Provider Location: Home  PCP:  Simone Curia, MD  Cardiologist:  No primary care provider on file.  Electrophysiologist:  None   Evaluation Performed:  Follow-Up Visit  Chief Complaint: Blood pressure issues  History of Present Illness:    Diamond [name] is a 78 y.o. female with past medical history of essential hypertension.  She has history of coronary artery disease and stroke.  She denies any problems at this time.  She takes care of activities of daily living.  With medication adjustments she tells me that she is doing great at this time.  She is very happy about it.  At the time of my evaluation, the patient is alert awake oriented and in no distress.  The patient does not have symptoms concerning for COVID-19 infection (fever, chills, cough, or new shortness of breath).    Past Medical History:  Diagnosis Date  . Arthritis    "arms, hands, neck, shoulders; some in my knees" (10/13/2017)  . CAD (coronary artery disease)   . CVA (cerebral vascular accident) (HCC) 1995, 2001   /notes  08/11/2010; "weakness in right arm as a result" (10/13/2017)  . History of blood transfusion    "related to OHS"  . History of kidney stones   . Hyperlipidemia   . Hypertension   . New onset a-fib (HCC) 10/13/2017  . Pneumonia    "when I was little"  . Subclavian steal syndrome 2004   LUE/notes 10/13/2017  . TIA (transient ischemic attack) 2003   [name] 08/11/2010  . Vertigo 10/13/2017   ataxia/notes 10/13/2017   Past Surgical History:  Procedure Laterality Date  . ABDOMINAL HYSTERECTOMY    . CARDIAC CATHETERIZATION  04/2003   [name] 08/11/2010  . CAROTID ARTERY - SUBCLAVIAN ARTERY BYPASS GRAFT Left 10/2002   carotid subclavian bypass with a 6 mm Dacron graft/notes 08/11/2010   . CARPAL TUNNEL RELEASE Right   . CHOLECYSTECTOMY OPEN    . CORONARY ANGIOPLASTY  04/2002   [name] 08/11/2010  . CORONARY ARTERY BYPASS GRAFT  05/2003   CABG X4/notes 08/11/2010  . CYSTOSCOPY W/ STONE MANIPULATION    . DILATION AND CURETTAGE OF UTERUS    . HERNIA REPAIR    . IR ANGIO INTRA EXTRACRAN SEL COM CAROTID INNOMINATE UNI L MOD SED  10/17/2017  . IR ANGIO INTRA EXTRACRAN SEL COM CAROTID INNOMINATE UNI R MOD SED  10/17/2017  . IR ANGIO VERTEBRAL SEL SUBCLAVIAN INNOMINATE BILAT MOD SED  10/17/2017  . IR ANGIOGRAM EXTREMITY BILATERAL  10/17/2017  . MEDIASTINAL EXPLORATION  05/2003   [name] 08/11/2010  . UMBILICAL HERNIA REPAIR  Current Meds  Medication Sig  . alendronate (FOSAMAX) 70 MG tablet Take 70 mg by mouth once a week.  . ALPRAZolam (XANAX) 0.5 MG tablet Take 1 tablet by mouth as needed.  Marland Kitchen amLODipine (NORVASC) 5 MG tablet Take 5 mg by mouth daily.  Marland Kitchen apixaban (ELIQUIS) 5 MG TABS tablet Take 1 tablet (5 mg total) by mouth 2 (two) times daily.  Marland Kitchen atorvastatin (LIPITOR) 80 MG tablet Take 80 mg by mouth daily.  . carvedilol (COREG) 3.125 MG tablet Take 1 tablet (3.125 mg total) by mouth 2 (two) times daily.  . hydroxypropyl methylcellulose / hypromellose (ISOPTO TEARS / GONIOVISC) 2.5 %  ophthalmic solution Place 1 drop into both eyes as needed for dry eyes.  . ranitidine (ZANTAC) 150 MG tablet Take 150 mg by mouth 2 (two) times daily.     Allergies:   Contrast media [iodinated diagnostic agents] and Codeine   Social History   Tobacco Use  . Smoking status: Former Smoker    Packs/day: 0.50    Years: 25.00    Pack years: 12.50    Types: Cigarettes    Last attempt to quit: 1994    Years since quitting: 26.3  . Smokeless tobacco: Never Used  Substance Use Topics  . Alcohol use: Yes    Frequency: Never    Comment: 10/13/2017 "glass of wine at Christmas"  . Drug use: Never     Family Hx: The patient's family history includes Heart disease in her father; Stroke in her mother.  ROS:   Please see the history of present illness.    As above All other systems reviewed and are negative.   Prior CV studies:   The following studies were reviewed today:  None  Labs/Other Tests and Data Reviewed:    EKG:  No ECG reviewed.  Recent Labs: 10/13/2017: ALT 18 10/17/2017: Hemoglobin 11.4; Platelets 294 10/18/2017: BUN 18; Creatinine, Ser 0.63; Potassium 4.3; Sodium 129   Recent Lipid Panel Lab Results  Component Value Date/Time   CHOL 120 10/14/2017 02:30 AM   TRIG 39 10/14/2017 02:30 AM   HDL 67 10/14/2017 02:30 AM   CHOLHDL 1.8 10/14/2017 02:30 AM   LDLCALC 45 10/14/2017 02:30 AM    Wt Readings from Last 3 Encounters:  08/14/18 159 lb (72.1 kg)  07/27/18 165 lb (74.8 kg)  07/20/18 158 lb (71.7 kg)     Objective:    Vital Signs:  BP 135/80 (BP Location: Right Arm, Patient Position: Standing, Cuff Size: Normal)   Ht 5' 1.5" (1.562 m)   Wt 159 lb (72.1 kg)   BMI 29.56 kg/m    VITAL SIGNS:  reviewed  ASSESSMENT & PLAN:    1. Essential hypertension: Her blood pressure is stable and she is happy about it.  She will continue her current medications. 2. Coronary artery disease: Stable and asymptomatic.  Will continue current medications 3. History of  paroxysmal fibrillation:I discussed with the patient atrial fibrillation, disease process. Management and therapy including rate and rhythm control, anticoagulation benefits and potential risks were discussed extensively with the patient. Patient had multiple questions which were answered to patient's satisfaction. 4. She will be seen in follow-up appointment in a month or earlier if she has any concerns.  COVID-19 Education: The signs and symptoms of COVID-19 were discussed with the patient and how to seek care for testing (follow up with PCP or arrange E-visit).  The importance of social distancing was discussed today.  Time:   Today, I have  spent 15 minutes with the patient with telehealth technology discussing the above problems.     Medication Adjustments/Labs and Tests Ordered: Current medicines are reviewed at length with the patient today.  Concerns regarding medicines are outlined above.   Tests Ordered: No orders of the defined types were placed in this encounter.   Medication Changes: No orders of the defined types were placed in this encounter.   Disposition:  Follow up in 1 month(s)  Signed, Garwin Brothersajan R , MD  08/14/2018 10:04 AM    [facility]

## 2018-08-22 ENCOUNTER — Telehealth: Payer: Medicare Other | Admitting: Cardiology

## 2018-08-29 DIAGNOSIS — B029 Zoster without complications: Secondary | ICD-10-CM | POA: Diagnosis not present

## 2018-08-29 DIAGNOSIS — M810 Age-related osteoporosis without current pathological fracture: Secondary | ICD-10-CM | POA: Diagnosis not present

## 2018-08-29 DIAGNOSIS — I679 Cerebrovascular disease, unspecified: Secondary | ICD-10-CM | POA: Diagnosis not present

## 2018-08-29 DIAGNOSIS — I2510 Atherosclerotic heart disease of native coronary artery without angina pectoris: Secondary | ICD-10-CM | POA: Diagnosis not present

## 2018-09-05 DIAGNOSIS — I679 Cerebrovascular disease, unspecified: Secondary | ICD-10-CM | POA: Diagnosis not present

## 2018-09-05 DIAGNOSIS — B029 Zoster without complications: Secondary | ICD-10-CM | POA: Diagnosis not present

## 2018-09-05 DIAGNOSIS — I2510 Atherosclerotic heart disease of native coronary artery without angina pectoris: Secondary | ICD-10-CM | POA: Diagnosis not present

## 2018-09-05 DIAGNOSIS — M810 Age-related osteoporosis without current pathological fracture: Secondary | ICD-10-CM | POA: Diagnosis not present

## 2018-09-18 DIAGNOSIS — I679 Cerebrovascular disease, unspecified: Secondary | ICD-10-CM | POA: Diagnosis not present

## 2018-09-18 DIAGNOSIS — I2510 Atherosclerotic heart disease of native coronary artery without angina pectoris: Secondary | ICD-10-CM | POA: Diagnosis not present

## 2018-09-18 DIAGNOSIS — M810 Age-related osteoporosis without current pathological fracture: Secondary | ICD-10-CM | POA: Diagnosis not present

## 2018-09-18 DIAGNOSIS — B0229 Other postherpetic nervous system involvement: Secondary | ICD-10-CM | POA: Diagnosis not present

## 2018-09-21 ENCOUNTER — Other Ambulatory Visit: Payer: Self-pay

## 2018-09-21 ENCOUNTER — Telehealth (INDEPENDENT_AMBULATORY_CARE_PROVIDER_SITE_OTHER): Payer: Medicare Other | Admitting: Cardiology

## 2018-09-21 ENCOUNTER — Encounter: Payer: Self-pay | Admitting: Cardiology

## 2018-09-21 VITALS — BP 135/80 | Ht 61.5 in | Wt 150.0 lb

## 2018-09-21 DIAGNOSIS — E782 Mixed hyperlipidemia: Secondary | ICD-10-CM

## 2018-09-21 DIAGNOSIS — I639 Cerebral infarction, unspecified: Secondary | ICD-10-CM

## 2018-09-21 DIAGNOSIS — Z8673 Personal history of transient ischemic attack (TIA), and cerebral infarction without residual deficits: Secondary | ICD-10-CM

## 2018-09-21 DIAGNOSIS — I2510 Atherosclerotic heart disease of native coronary artery without angina pectoris: Secondary | ICD-10-CM

## 2018-09-21 DIAGNOSIS — Z951 Presence of aortocoronary bypass graft: Secondary | ICD-10-CM

## 2018-09-21 DIAGNOSIS — I10 Essential (primary) hypertension: Secondary | ICD-10-CM

## 2018-09-21 DIAGNOSIS — I480 Paroxysmal atrial fibrillation: Secondary | ICD-10-CM

## 2018-09-21 NOTE — Progress Notes (Signed)
Virtual Visit via Telephone Note   This visit type was conducted due to national recommendations for restrictions regarding the COVID-19 Pandemic (e.g. social distancing) in an effort to limit this patient's exposure and mitigate transmission in our community.  Due to her co-morbid illnesses, this patient is at least at moderate risk for complications without adequate follow up.  This format is felt to be most appropriate for this patient at this time.  The patient did not have access to video technology/had technical difficulties with video requiring transitioning to audio format only (telephone).  All issues noted in this document were discussed and addressed.  No physical exam could be performed with this format.  Please refer to the patient's chart for her  consent to telehealth for [facility].   Date:  09/21/2018   ID:  Diamond [name], DOB 08/03/1940, MRN [id number]  Patient Location: Home Provider Location: Office  PCP:  Simone CuriaLee, Keung, MD  Cardiologist:  No primary care provider on file.  Electrophysiologist:  None   Evaluation Performed:  Follow-Up Visit  Chief Complaint: Coronary artery disease  History of Present Illness:    Diamond Clasatty B Shovlin is a 78 y.o. female with past medical history of coronary artery disease, essential hypertension, dyslipidemia and atrial fibrillation.  He denies any problems at this time.  She mentions to me that she had shingles.  No chest pain orthopnea or PND.  She is trying to ambulate without any symptoms.  She tells me that she hardly has any appetite and has lost weight.  She is due to see her primary care physician in the next few days and get blood work.  The patient does not have symptoms concerning for COVID-19 infection (fever, chills, cough, or new shortness of breath).    Past Medical History:  Diagnosis Date  . Arthritis    "arms, hands, neck, shoulders; some in my knees" (10/13/2017)  . CAD (coronary artery disease)   . CVA (cerebral  vascular accident) (HCC) 1995, 2001   /notes 08/11/2010; "weakness in right arm as a result" (10/13/2017)  . History of blood transfusion    "related to OHS"  . History of kidney stones   . Hyperlipidemia   . Hypertension   . New onset a-fib (HCC) 10/13/2017  . Pneumonia    "when I was little"  . Subclavian steal syndrome 2004   LUE/notes 10/13/2017  . TIA (transient ischemic attack) 2003   [name]/notes 08/11/2010  . Vertigo 10/13/2017   ataxia/notes 10/13/2017   Past Surgical History:  Procedure Laterality Date  . ABDOMINAL HYSTERECTOMY    . CARDIAC CATHETERIZATION  04/2003   [name]/notes 08/11/2010  . CAROTID ARTERY - SUBCLAVIAN ARTERY BYPASS GRAFT Left 10/2002   carotid subclavian bypass with a 6 mm Dacron graft/notes 08/11/2010   . CARPAL TUNNEL RELEASE Right   . CHOLECYSTECTOMY OPEN    . CORONARY ANGIOPLASTY  04/2002   [name]/notes 08/11/2010  . CORONARY ARTERY BYPASS GRAFT  05/2003   CABG X4/notes 08/11/2010  . CYSTOSCOPY W/ STONE MANIPULATION    . DILATION AND CURETTAGE OF UTERUS    . HERNIA REPAIR    . IR ANGIO INTRA EXTRACRAN SEL COM CAROTID INNOMINATE UNI L MOD SED  10/17/2017  . IR ANGIO INTRA EXTRACRAN SEL COM CAROTID INNOMINATE UNI R MOD SED  10/17/2017  . IR ANGIO VERTEBRAL SEL SUBCLAVIAN INNOMINATE BILAT MOD SED  10/17/2017  . IR ANGIOGRAM EXTREMITY BILATERAL  10/17/2017  . MEDIASTINAL EXPLORATION  05/2003   [name]/notes 08/11/2010  .  UMBILICAL HERNIA REPAIR       Current Meds  Medication Sig  . alendronate (FOSAMAX) 70 MG tablet Take 70 mg by mouth once a week.  . ALPRAZolam (XANAX) 0.5 MG tablet Take 1 tablet by mouth as needed.  Marland Kitchen. amLODipine (NORVASC) 5 MG tablet Take 5 mg by mouth daily.  Marland Kitchen. apixaban (ELIQUIS) 5 MG TABS tablet Take 1 tablet (5 mg total) by mouth 2 (two) times daily.  Marland Kitchen. atorvastatin (LIPITOR) 80 MG tablet Take 80 mg by mouth daily.  . carvedilol (COREG) 3.125 MG tablet Take 1 tablet (3.125 mg total) by mouth 2 (two) times daily.  . furosemide (LASIX) 20 MG tablet Take 20  mg by mouth daily as needed.  . gabapentin (NEURONTIN) 300 MG capsule Take 1 capsule by mouth daily.  . hydroxypropyl methylcellulose / hypromellose (ISOPTO TEARS / GONIOVISC) 2.5 % ophthalmic solution Place 1 drop into both eyes as needed for dry eyes.  . meclizine (ANTIVERT) 25 MG tablet Take 25 mg by mouth 3 (three) times daily as needed for dizziness.  . meloxicam (MOBIC) 15 MG tablet Take 1 tablet by mouth as needed.  . ondansetron (ZOFRAN) 4 MG tablet Take 4 mg by mouth every 8 (eight) hours as needed for nausea or vomiting.  . ranitidine (ZANTAC) 150 MG tablet Take 150 mg by mouth 2 (two) times daily.     Allergies:   Contrast media [iodinated diagnostic agents] and Codeine   Social History   Tobacco Use  . Smoking status: Former Smoker    Packs/day: 0.50    Years: 25.00    Pack years: 12.50    Types: Cigarettes    Quit date: 1994    Years since quitting: 26.4  . Smokeless tobacco: Never Used  Substance Use Topics  . Alcohol use: Yes    Frequency: Never    Comment: 10/13/2017 "glass of wine at Christmas"  . Drug use: Never     Family Hx: The patient's family history includes Heart disease in her father; Stroke in her mother.  ROS:   Please see the history of present illness.    As mentioned above All other systems reviewed and are negative.   Prior CV studies:   The following studies were reviewed today:  Hospital records were reviewed  Labs/Other Tests and Data Reviewed:    EKG:  No ECG reviewed.  Recent Labs: 10/13/2017: ALT 18 10/17/2017: Hemoglobin 11.4; Platelets 294 10/18/2017: BUN 18; Creatinine, Ser 0.63; Potassium 4.3; Sodium 129   Recent Lipid Panel Lab Results  Component Value Date/Time   CHOL 120 10/14/2017 02:30 AM   TRIG 39 10/14/2017 02:30 AM   HDL 67 10/14/2017 02:30 AM   CHOLHDL 1.8 10/14/2017 02:30 AM   LDLCALC 45 10/14/2017 02:30 AM    Wt Readings from Last 3 Encounters:  09/21/18 150 lb (68 kg)  08/14/18 159 lb (72.1 kg)   07/27/18 165 lb (74.8 kg)     Objective:    Vital Signs:  BP 135/80 (BP Location: Left Arm, Patient Position: Sitting, Cuff Size: Normal)   Ht 5' 1.5" (1.562 m)   Wt 150 lb (68 kg)   BMI 27.88 kg/m    VITAL SIGNS:  reviewed  ASSESSMENT & PLAN:    1. Coronary artery disease: Secondary prevention stressed with the patient.  Importance of compliance with diet and medication stressed and she vocalized understood 2. Essential hypertension: Blood pressure is stable 3. Paroxysmal atrial fibrillation:I discussed with the patient atrial fibrillation, disease process.  Management and therapy including rate and rhythm control, anticoagulation benefits and potential risks were discussed extensively with the patient. Patient had multiple questions which were answered to patient's satisfaction. 4. Mixed dyslipidemia: Diet was discussed and she will have blood work in the next few days done by her primary care physician. 5. Patient will be seen in follow-up appointment in 2 months or earlier if the patient has any concerns   COVID-19 Education: The signs and symptoms of COVID-19 were discussed with the patient and how to seek care for testing (follow up with PCP or arrange E-visit).  The importance of social distancing was discussed today.  Time:   Today, I have spent 13 minutes with the patient with telehealth technology discussing the above problems.     Medication Adjustments/Labs and Tests Ordered: Current medicines are reviewed at length with the patient today.  Concerns regarding medicines are outlined above.   Tests Ordered: No orders of the defined types were placed in this encounter.   Medication Changes: No orders of the defined types were placed in this encounter.   Follow Up:  Virtual Visit or In Person in 2 month(s)  Signed, Jenean Lindau, MD  09/21/2018 10:16 AM    [place]

## 2018-09-21 NOTE — Patient Instructions (Signed)
Medication Instructions: Your physician recommends that you continue on your current medications as directed. Please refer to the Current Medication list given to you today.  If you need a refill on your cardiac medications before your next appointment, please call your pharmacy.   Lab work: NONE If you have labs (blood work) drawn today and your tests are completely normal, you will receive your results only by: . MyChart Message (if you have MyChart) OR . A paper copy in the mail If you have any lab test that is abnormal or we need to change your treatment, we will call you to review the results.  Testing/Procedures: NONE  Follow-Up: At [facility], you and your health needs are our priority.  As part of our continuing mission to provide you with exceptional heart care, we have created designated Provider Care Teams.  These Care Teams include your primary Cardiologist (physician) and Advanced Practice Providers (APPs -  Physician Assistants and Nurse Practitioners) who all work together to provide you with the care you need, when you need it. You will need a follow up appointment in 2 months.    

## 2018-09-28 DIAGNOSIS — L0390 Cellulitis, unspecified: Secondary | ICD-10-CM | POA: Diagnosis not present

## 2018-09-28 DIAGNOSIS — I679 Cerebrovascular disease, unspecified: Secondary | ICD-10-CM | POA: Diagnosis not present

## 2018-09-28 DIAGNOSIS — I2510 Atherosclerotic heart disease of native coronary artery without angina pectoris: Secondary | ICD-10-CM | POA: Diagnosis not present

## 2018-09-28 DIAGNOSIS — M810 Age-related osteoporosis without current pathological fracture: Secondary | ICD-10-CM | POA: Diagnosis not present

## 2018-10-12 DIAGNOSIS — I679 Cerebrovascular disease, unspecified: Secondary | ICD-10-CM | POA: Diagnosis not present

## 2018-10-12 DIAGNOSIS — M810 Age-related osteoporosis without current pathological fracture: Secondary | ICD-10-CM | POA: Diagnosis not present

## 2018-10-12 DIAGNOSIS — B0229 Other postherpetic nervous system involvement: Secondary | ICD-10-CM | POA: Diagnosis not present

## 2018-10-12 DIAGNOSIS — I2510 Atherosclerotic heart disease of native coronary artery without angina pectoris: Secondary | ICD-10-CM | POA: Diagnosis not present

## 2018-10-17 IMAGING — XA IR CARO CERE HEAD/NECK UNILAT LEFT (MS)
1 series · 11 of 24 positions shown · IV contrast (IODINE)
Comparison: Recent MRI of brain of 10/13/2017, and diagnostic
catheter arteriogram of 7556.

CLINICAL DATA: Left cerebellar stroke. Gait imbalance. Previous
history of left subclavian artery occlusion with bypass graft.

EXAM:
IR ANGIO INTRA EXTRACRAN SEL COM CAROTID INNOMINATE UNI LEFT MOD
SED; IR BILATERAL ANGIO VERTERBRAL SELECTIVE SUBCALVIAN INNOMNATE
WITH MODERATE SEDATION; BILATERAL EXTREMITY ARTERIOGRAPHY; IR ANGIO
INTRA EXTRACRAN SEL COM CAROTID INNOMINATE UNI RIGHT MOD SED
TECHNIQUE: Informed written consent was obtained from the patient after a
thorough discussion of the procedural risks, benefits and
alternatives. All questions were addressed. Maximal Sterile Barrier
Technique was utilized including caps, mask, sterile gowns, sterile
gloves, sterile drape, hand hygiene and skin antiseptic. A timeout
was performed prior to the initiation of the procedure.

[Series 300: dr. (person_name) · 11 of 208 slices shown]
[im 10/208]
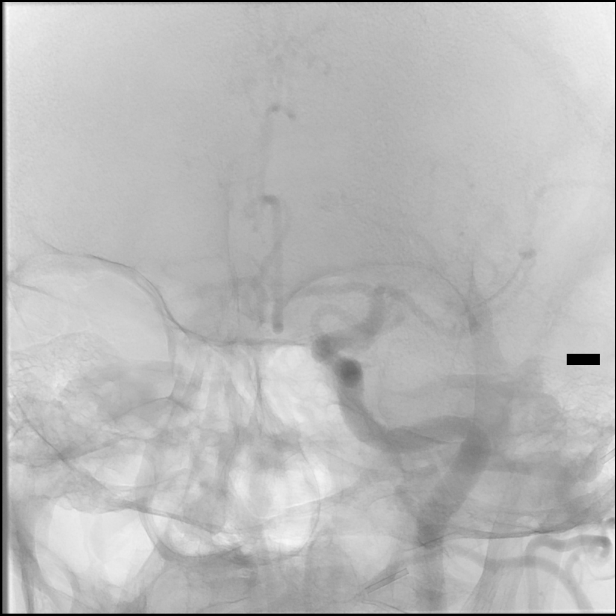
[im 28/208]
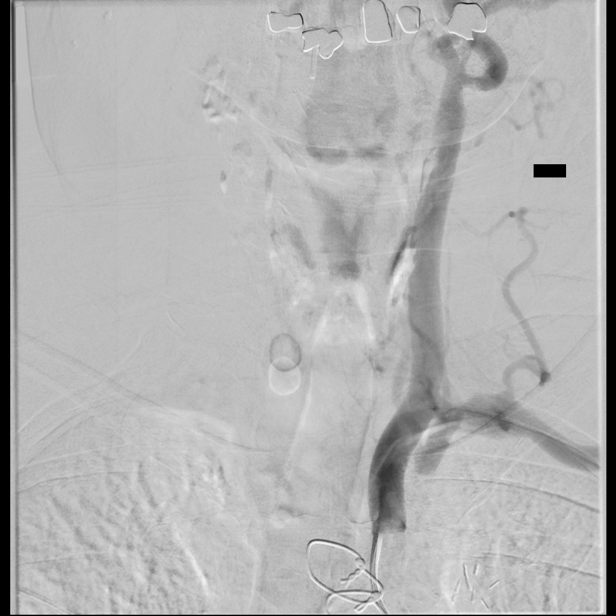
[im 46/208]
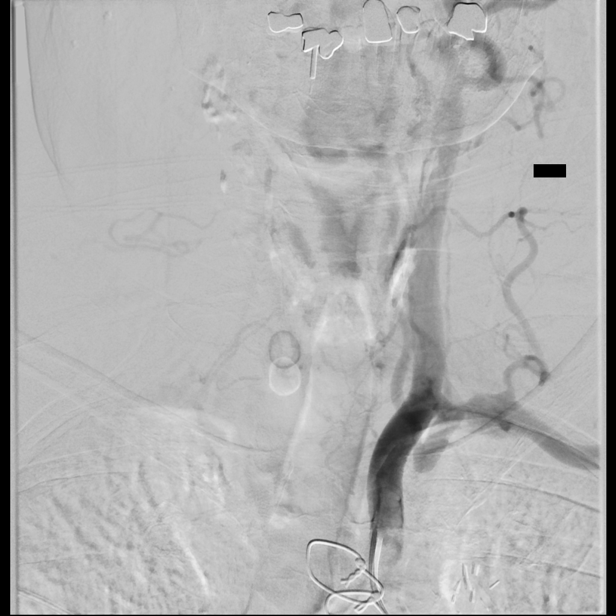
[im 64/208]
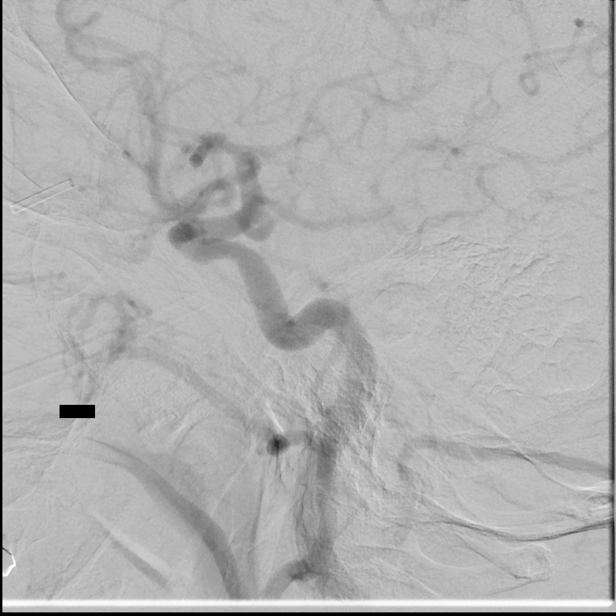
[im 82/208]
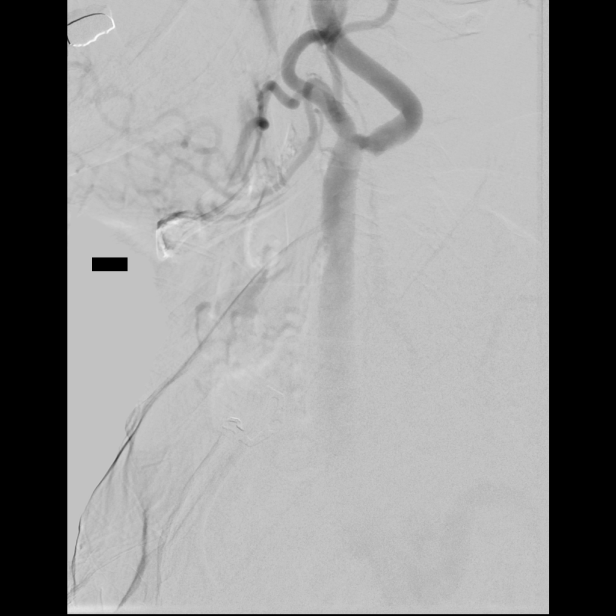
[im 109/208]
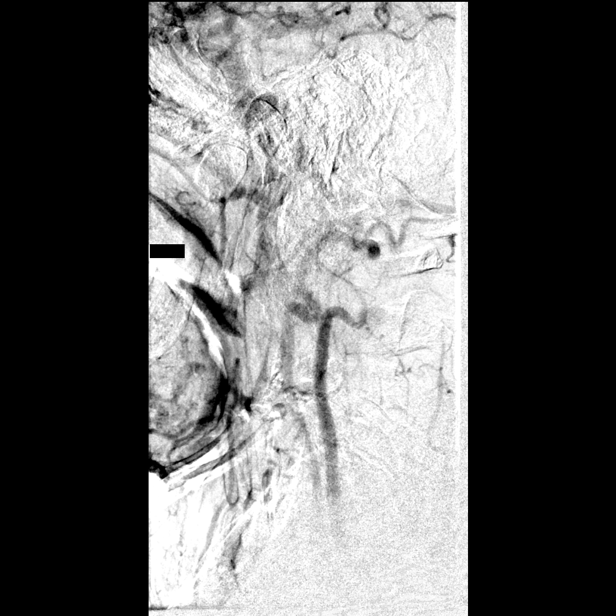
[im 127/208]
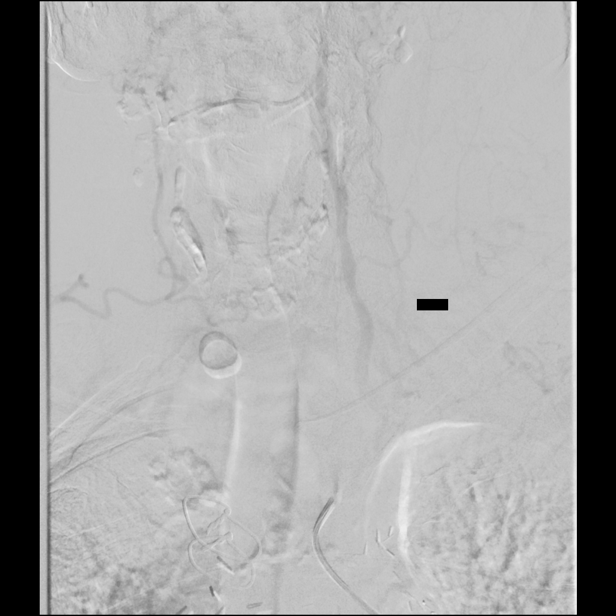
[im 145/208]
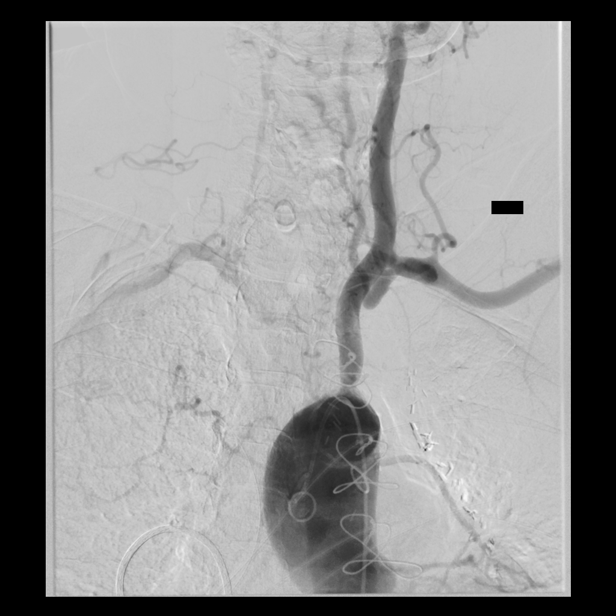
[im 163/208]
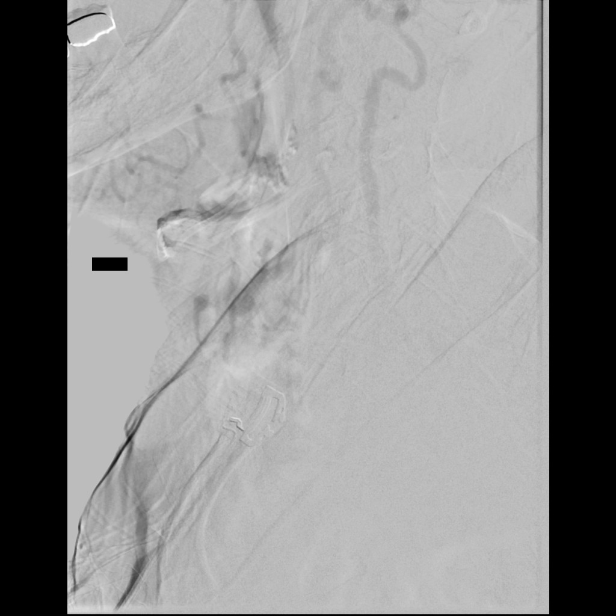
[im 181/208]
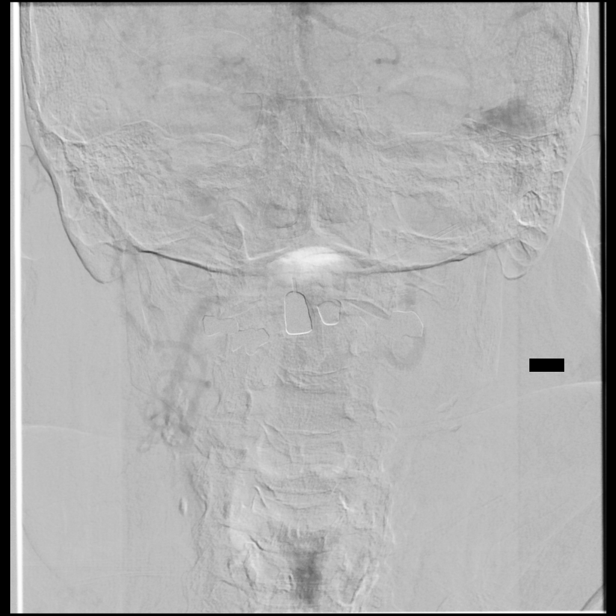
[im 199/208]
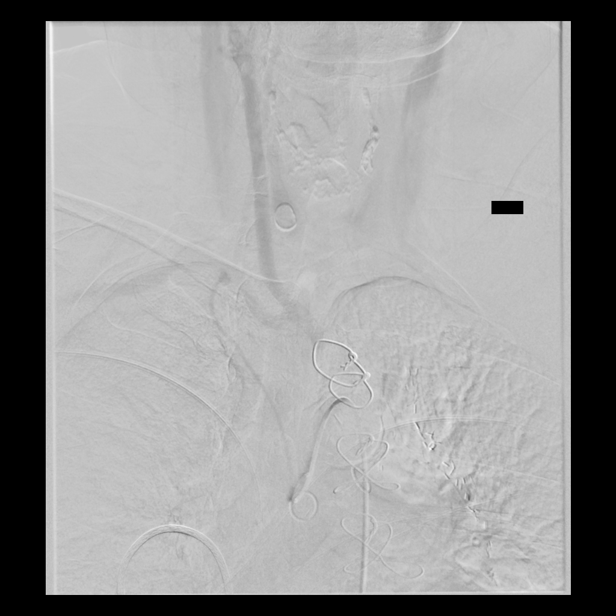

[11 of 24 positions shown; findings below may reference images not displayed]

MEDICATIONS:
Heparin 3666 units IV; no antibiotic was administered within 1 hour
of the procedure.

ANESTHESIA/SEDATION:
Versed 0 mg IV; Fentanyl 25 mcg IV.

Moderate Sedation Time:   23 minutes.

The patient was continuously monitored during the procedure by the
interventional radiology nurse under my direct supervision.

CONTRAST:  Isovue 300 approximately 60 mL.

FLUOROSCOPY TIME:  Fluoroscopy Time: 13 minutes 24 seconds (1323
mGy).

COMPLICATIONS:
None inonemmediate.
The right groin was prepped and draped in the usual sterile fashion.
Thereafter using modified Seldinger technique, transfemoral access
into the right common femoral artery was obtained without
difficulty. Over a 0.035 inch guidewire, a 5 French Pinnacle sheath
was inserted. Through this, and also over 0.035 inch guidewire, a 5
French JB 1 catheter was advanced to the aortic arch region and
selectively positioned , the left common carotid artery and the left
subclavian artery.

An arteriogram was also performed using a pigtail catheter.
FINDINGS: The left common carotid arteriogram demonstrates a prominent left
common carotid artery which opacifies to the carotid bifurcation.

The left common carotid bifurcation demonstrates the left external
carotid artery and its major branches to be widely patent.

There is an approximately 50-60% stenosis of the left carotid bulb
secondary to a smooth atherosclerotic plaque. No acute ulcerations
were seen.

The left internal carotid artery distal to this is seen to opacify
to the cranial skull base.

Wide patency is seen of the petrous, cavernous and the supraclinoid
segments.

A left posterior communicating artery is seen opacifying the left
posterior cerebral artery distribution.

The left middle cerebral artery and the left anterior cerebral
artery opacify into the capillary and venous phases. A left
posterior communicating artery is seen opacifying the left posterior
cerebral distribution.

Prompt opacification via the anterior communicating artery of the
right anterior cerebral artery A2 segment and distally, the right A1
segment, and also a right middle cerebral artery is noted via the
left internal carotid artery injection.

More proximally, again demonstrated is a patent bypass graft with
approximately 50% stenosis.

The graft is seen to opacify to the left subclavian artery and
distally. Also demonstrated is retrograde opacification of the left
subclavian artery with subsequent antegrade flow into the left
vertebral artery to the cranial skull base. The left vertebral
artery demonstrates wide patency to the cranial skull base.

There is suggestion of faint opacification of the left
posterior-inferior cerebellar artery.

The lateral projection demonstrates opacification of the basilar
artery and the superior cerebellar arteries.

A left subclavian artery demonstrates complete angiographic
occlusion just distal to its origin.

The arteriogram reveals mild to moderate stenosis at the origin of
the left common carotid artery.

The delayed arterial phase demonstrates antegrade flow in the left
vertebral artery as described earlier, and also retrograde flow of
the right vertebral artery to opacify the right subclavian artery
and the right thyrocervical trunk.

There is retrograde opacification of the right subclavian artery
with subsequent antegrade flow into the right common carotid artery
to its bifurcation. The delayed lateral injection again demonstrates
subsequent opacification of the right external carotid artery, and
the right internal carotid artery to the cranial skull base with
wide patency of the petrous, the cavernous and the supraclinoid
segments.

Also demonstrated is the opacification of the right posterior
communicating artery. Opacification is noted of the right middle
cerebral artery M1 segment and distally also.

The right internal carotid artery at its origin demonstrates a
calcified plaque without significant stenosis by the NASCET
criteria.

A lower abdominal aortogram with runoff to the common femoral
arteries bilaterally demonstrates eccentric atherosclerotic plaque
at the level of the abdominal bifurcation associated with severe
approximately 80% stenosis of the right common iliac artery at its
origin, and also of the mid right common iliac artery.

There is external runoff distal to this to the common femoral
bifurcation. The left common iliac artery demonstrates minimal
smooth plaque along its lateral wall just distal to its origin.
Again the left common femoral bifurcation and its branches are
widely patent.
IMPRESSION: Approximately 50-60% stenosis of the left internal carotid artery at
the bulb.

Angiographically occluded left subclavian artery, with patent left
common carotid artery to left subclavian graft with patency of
approximately 50%.

Subsequent antegrade flow into the left vertebral artery into the
posterior fossa is noted.

Right subclavian steal from the left vertebral artery with
subsequent opacification of the right common carotid artery
retrogradely and then antegradely as described above.

Angiographically occluded innominate artery.

Approximately 50% stenosis of the origin of the left common carotid
artery.

Severe stenosis of the right common iliac artery at its origin, and
distal to this to the mid right common iliac artery.

PLAN:
Angiographic findings were reviewed with the patient and her family,
and also the referring neurologist.

Follow-up in clinic in approximately 4 to 6 weeks.

## 2018-10-26 DIAGNOSIS — M810 Age-related osteoporosis without current pathological fracture: Secondary | ICD-10-CM | POA: Diagnosis not present

## 2018-10-26 DIAGNOSIS — I679 Cerebrovascular disease, unspecified: Secondary | ICD-10-CM | POA: Diagnosis not present

## 2018-10-26 DIAGNOSIS — I2510 Atherosclerotic heart disease of native coronary artery without angina pectoris: Secondary | ICD-10-CM | POA: Diagnosis not present

## 2018-10-26 DIAGNOSIS — B0229 Other postherpetic nervous system involvement: Secondary | ICD-10-CM | POA: Diagnosis not present

## 2018-11-02 DIAGNOSIS — I2510 Atherosclerotic heart disease of native coronary artery without angina pectoris: Secondary | ICD-10-CM | POA: Diagnosis not present

## 2018-11-02 DIAGNOSIS — R600 Localized edema: Secondary | ICD-10-CM | POA: Diagnosis not present

## 2018-11-02 DIAGNOSIS — B0229 Other postherpetic nervous system involvement: Secondary | ICD-10-CM | POA: Diagnosis not present

## 2018-11-02 DIAGNOSIS — I679 Cerebrovascular disease, unspecified: Secondary | ICD-10-CM | POA: Diagnosis not present

## 2018-11-09 DIAGNOSIS — R600 Localized edema: Secondary | ICD-10-CM | POA: Diagnosis not present

## 2018-11-09 DIAGNOSIS — I2510 Atherosclerotic heart disease of native coronary artery without angina pectoris: Secondary | ICD-10-CM | POA: Diagnosis not present

## 2018-11-09 DIAGNOSIS — M159 Polyosteoarthritis, unspecified: Secondary | ICD-10-CM | POA: Diagnosis not present

## 2018-11-09 DIAGNOSIS — I679 Cerebrovascular disease, unspecified: Secondary | ICD-10-CM | POA: Diagnosis not present

## 2018-11-14 DIAGNOSIS — I2510 Atherosclerotic heart disease of native coronary artery without angina pectoris: Secondary | ICD-10-CM | POA: Diagnosis not present

## 2018-11-14 DIAGNOSIS — N390 Urinary tract infection, site not specified: Secondary | ICD-10-CM | POA: Diagnosis not present

## 2018-11-14 DIAGNOSIS — M159 Polyosteoarthritis, unspecified: Secondary | ICD-10-CM | POA: Diagnosis not present

## 2018-11-14 DIAGNOSIS — R600 Localized edema: Secondary | ICD-10-CM | POA: Diagnosis not present

## 2018-11-23 DIAGNOSIS — I679 Cerebrovascular disease, unspecified: Secondary | ICD-10-CM | POA: Diagnosis not present

## 2018-11-23 DIAGNOSIS — R600 Localized edema: Secondary | ICD-10-CM | POA: Diagnosis not present

## 2018-11-23 DIAGNOSIS — I2510 Atherosclerotic heart disease of native coronary artery without angina pectoris: Secondary | ICD-10-CM | POA: Diagnosis not present

## 2018-11-23 DIAGNOSIS — M159 Polyosteoarthritis, unspecified: Secondary | ICD-10-CM | POA: Diagnosis not present

## 2018-12-07 ENCOUNTER — Other Ambulatory Visit: Payer: Self-pay | Admitting: Cardiology

## 2018-12-07 DIAGNOSIS — N390 Urinary tract infection, site not specified: Secondary | ICD-10-CM | POA: Diagnosis not present

## 2018-12-07 DIAGNOSIS — R600 Localized edema: Secondary | ICD-10-CM | POA: Diagnosis not present

## 2018-12-07 DIAGNOSIS — I2510 Atherosclerotic heart disease of native coronary artery without angina pectoris: Secondary | ICD-10-CM | POA: Diagnosis not present

## 2018-12-07 DIAGNOSIS — I679 Cerebrovascular disease, unspecified: Secondary | ICD-10-CM | POA: Diagnosis not present

## 2018-12-21 DIAGNOSIS — I679 Cerebrovascular disease, unspecified: Secondary | ICD-10-CM | POA: Diagnosis not present

## 2018-12-21 DIAGNOSIS — M159 Polyosteoarthritis, unspecified: Secondary | ICD-10-CM | POA: Diagnosis not present

## 2018-12-21 DIAGNOSIS — I2510 Atherosclerotic heart disease of native coronary artery without angina pectoris: Secondary | ICD-10-CM | POA: Diagnosis not present

## 2018-12-21 DIAGNOSIS — R600 Localized edema: Secondary | ICD-10-CM | POA: Diagnosis not present

## 2019-01-04 DIAGNOSIS — Z139 Encounter for screening, unspecified: Secondary | ICD-10-CM | POA: Diagnosis not present

## 2019-01-04 DIAGNOSIS — R600 Localized edema: Secondary | ICD-10-CM | POA: Diagnosis not present

## 2019-01-04 DIAGNOSIS — I2510 Atherosclerotic heart disease of native coronary artery without angina pectoris: Secondary | ICD-10-CM | POA: Diagnosis not present

## 2019-01-04 DIAGNOSIS — E785 Hyperlipidemia, unspecified: Secondary | ICD-10-CM | POA: Diagnosis not present

## 2019-01-04 DIAGNOSIS — I10 Essential (primary) hypertension: Secondary | ICD-10-CM | POA: Diagnosis not present

## 2019-01-04 DIAGNOSIS — I679 Cerebrovascular disease, unspecified: Secondary | ICD-10-CM | POA: Diagnosis not present

## 2019-01-04 DIAGNOSIS — M159 Polyosteoarthritis, unspecified: Secondary | ICD-10-CM | POA: Diagnosis not present

## 2019-01-04 DIAGNOSIS — Z23 Encounter for immunization: Secondary | ICD-10-CM | POA: Diagnosis not present

## 2019-01-15 DIAGNOSIS — K5909 Other constipation: Secondary | ICD-10-CM | POA: Diagnosis not present

## 2019-01-15 DIAGNOSIS — I2510 Atherosclerotic heart disease of native coronary artery without angina pectoris: Secondary | ICD-10-CM | POA: Diagnosis not present

## 2019-01-15 DIAGNOSIS — M159 Polyosteoarthritis, unspecified: Secondary | ICD-10-CM | POA: Diagnosis not present

## 2019-01-15 DIAGNOSIS — I679 Cerebrovascular disease, unspecified: Secondary | ICD-10-CM | POA: Diagnosis not present

## 2019-01-19 DIAGNOSIS — I2510 Atherosclerotic heart disease of native coronary artery without angina pectoris: Secondary | ICD-10-CM | POA: Diagnosis not present

## 2019-01-19 DIAGNOSIS — I679 Cerebrovascular disease, unspecified: Secondary | ICD-10-CM | POA: Diagnosis not present

## 2019-01-19 DIAGNOSIS — M159 Polyosteoarthritis, unspecified: Secondary | ICD-10-CM | POA: Diagnosis not present

## 2019-01-19 DIAGNOSIS — K5909 Other constipation: Secondary | ICD-10-CM | POA: Diagnosis not present

## 2019-02-09 DIAGNOSIS — M159 Polyosteoarthritis, unspecified: Secondary | ICD-10-CM | POA: Diagnosis not present

## 2019-02-09 DIAGNOSIS — I2510 Atherosclerotic heart disease of native coronary artery without angina pectoris: Secondary | ICD-10-CM | POA: Diagnosis not present

## 2019-02-09 DIAGNOSIS — I679 Cerebrovascular disease, unspecified: Secondary | ICD-10-CM | POA: Diagnosis not present

## 2019-02-09 DIAGNOSIS — K5909 Other constipation: Secondary | ICD-10-CM | POA: Diagnosis not present

## 2019-02-15 DIAGNOSIS — J0100 Acute maxillary sinusitis, unspecified: Secondary | ICD-10-CM | POA: Diagnosis not present

## 2019-02-15 DIAGNOSIS — M159 Polyosteoarthritis, unspecified: Secondary | ICD-10-CM | POA: Diagnosis not present

## 2019-02-15 DIAGNOSIS — I2510 Atherosclerotic heart disease of native coronary artery without angina pectoris: Secondary | ICD-10-CM | POA: Diagnosis not present

## 2019-02-15 DIAGNOSIS — I679 Cerebrovascular disease, unspecified: Secondary | ICD-10-CM | POA: Diagnosis not present

## 2019-03-02 ENCOUNTER — Other Ambulatory Visit: Payer: Self-pay

## 2019-03-02 ENCOUNTER — Ambulatory Visit (INDEPENDENT_AMBULATORY_CARE_PROVIDER_SITE_OTHER): Payer: Medicare Other | Admitting: Cardiology

## 2019-03-02 ENCOUNTER — Encounter: Payer: Self-pay | Admitting: Cardiology

## 2019-03-02 VITALS — BP 126/62 | HR 98 | Ht 61.5 in | Wt 143.0 lb

## 2019-03-02 DIAGNOSIS — I480 Paroxysmal atrial fibrillation: Secondary | ICD-10-CM | POA: Diagnosis not present

## 2019-03-02 DIAGNOSIS — Z951 Presence of aortocoronary bypass graft: Secondary | ICD-10-CM

## 2019-03-02 DIAGNOSIS — Z8673 Personal history of transient ischemic attack (TIA), and cerebral infarction without residual deficits: Secondary | ICD-10-CM

## 2019-03-02 DIAGNOSIS — I10 Essential (primary) hypertension: Secondary | ICD-10-CM | POA: Diagnosis not present

## 2019-03-02 DIAGNOSIS — E782 Mixed hyperlipidemia: Secondary | ICD-10-CM | POA: Diagnosis not present

## 2019-03-02 MED ORDER — ATORVASTATIN CALCIUM 80 MG PO TABS: 80.0000 mg | ORAL_TABLET | Freq: Every day | ORAL | 2 refills | Status: DC

## 2019-03-02 NOTE — Patient Instructions (Signed)

## 2019-03-02 NOTE — Progress Notes (Signed)
Cardiology Office Note:    Date:  03/02/2019   ID:  Diamond Clasatty B Farquhar, DOB [birth year], MRN [id number]  PCP:  Simone CuriaLee, Keung, MD  Cardiologist:  Garwin Brothersajan R Taniyah Ballow, MD   Referring MD: Simone CuriaLee, Keung, MD    ASSESSMENT:    1. Essential hypertension   2. Mixed hyperlipidemia   3. PAF (paroxysmal atrial fibrillation) (HCC)   4. History of stroke   5. Hx of CABG    PLAN:    In order of problems listed above:  1. Atrial fibrillation: Persistent:I discussed with the patient atrial fibrillation, disease process. Management and therapy including rate and rhythm control, anticoagulation benefits and potential risks were discussed extensively with the patient. Patient had multiple questions which were answered to patient's satisfaction. 2. Coronary artery disease: Stable at this time.  Secondary prevention stressed and importance of compliance with diet and medication stressed and she vocalized understanding. 3. Essential hypertension: Blood pressure is stable 4. Mixed dyslipidemia: Diet was discussed.  She recently had blood work with primary care physician and she was told that it was fine.  We will try to get a copy of those results.  Patient wants refills of statins and we will do it for her. 5. Patient will be seen in follow-up appointment in 6 months or earlier if the patient has any concerns    Medication Adjustments/Labs and Tests Ordered: Current medicines are reviewed at length with the patient today.  Concerns regarding medicines are outlined above.  No orders of the defined types were placed in this encounter.  No orders of the defined types were placed in this encounter.    Chief Complaint  Patient presents with  . Follow-up     History of Present Illness:    Diamond [name] is a 78 y.o. female.  Patient has past medical history of coronary artery disease, cerebrovascular disease, stroke, paroxysmal defibrillation, essential hypertension diabetes mellitus and dyslipidemia.  She denies  any problems at this time and takes care of activities of daily living.  No chest pain orthopnea or PND.  She ambulates with a cane but is steady on her feet and has had no falls.  At the time of my evaluation, the patient is alert awake oriented and in no distress.  Past Medical History:  Diagnosis Date  . Arthritis    "arms, hands, neck, shoulders; some in my knees" (10/13/2017)  . CAD (coronary artery disease)   . CVA (cerebral vascular accident) (HCC) 1995, 2001   /notes 08/11/2010; "weakness in right arm as a result" (10/13/2017)  . History of blood transfusion    "related to OHS"  . History of kidney stones   . Hyperlipidemia   . Hypertension   . New onset a-fib (HCC) 10/13/2017  . Pneumonia    "when I was little"  . Subclavian steal syndrome 2004   LUE/notes 10/13/2017  . TIA (transient ischemic attack) 2003   [name]/notes 08/11/2010  . Vertigo 10/13/2017   ataxia/notes 10/13/2017    Past Surgical History:  Procedure Laterality Date  . ABDOMINAL HYSTERECTOMY    . CARDIAC CATHETERIZATION  04/2003   [name]/notes 08/11/2010  . CAROTID ARTERY - SUBCLAVIAN ARTERY BYPASS GRAFT Left 10/2002   carotid subclavian bypass with a 6 mm Dacron graft/notes 08/11/2010   . CARPAL TUNNEL RELEASE Right   . CHOLECYSTECTOMY OPEN    . CORONARY ANGIOPLASTY  04/2002   [name]/notes 08/11/2010  . CORONARY ARTERY BYPASS GRAFT  05/2003   CABG X4/notes 08/11/2010  . CYSTOSCOPY  W/ STONE MANIPULATION    . DILATION AND CURETTAGE OF UTERUS    . HERNIA REPAIR    . IR ANGIO INTRA EXTRACRAN SEL COM CAROTID INNOMINATE UNI L MOD SED  10/17/2017  . IR ANGIO INTRA EXTRACRAN SEL COM CAROTID INNOMINATE UNI R MOD SED  10/17/2017  . IR ANGIO VERTEBRAL SEL SUBCLAVIAN INNOMINATE BILAT MOD SED  10/17/2017  . IR ANGIOGRAM EXTREMITY BILATERAL  10/17/2017  . MEDIASTINAL EXPLORATION  05/2003   Archie Endo 08/11/2010  . UMBILICAL HERNIA REPAIR      Current Medications: Current Meds  Medication Sig  . alendronate (FOSAMAX) 70 MG tablet Take 70 mg  by mouth once a week.  . ALPRAZolam (XANAX) 0.5 MG tablet Take 1 tablet by mouth as needed.  Marland Kitchen atorvastatin (LIPITOR) 80 MG tablet Take 80 mg by mouth daily.  . carvedilol (COREG) 3.125 MG tablet Take 1 tablet (3.125 mg total) by mouth 2 (two) times daily.  Marland Kitchen ELIQUIS 5 MG TABS tablet TAKE 1 TABLET BY MOUTH TWICE A DAY  . furosemide (LASIX) 20 MG tablet Take 20 mg by mouth daily as needed.  . hydroxypropyl methylcellulose / hypromellose (ISOPTO TEARS / GONIOVISC) 2.5 % ophthalmic solution Place 1 drop into both eyes as needed for dry eyes.     Allergies:   Contrast media [iodinated diagnostic agents] and Codeine   Social History   Socioeconomic History  . Marital status: Widowed    Spouse name: Not on file  . Number of children: Not on file  . Years of education: Not on file  . Highest education level: Not on file  Occupational History  . Not on file  Social Needs  . Financial resource strain: Not on file  . Food insecurity    Worry: Not on file    Inability: Not on file  . Transportation needs    Medical: Not on file    Non-medical: Not on file  Tobacco Use  . Smoking status: Former Smoker    Packs/day: 0.50    Years: 25.00    Pack years: 12.50    Types: Cigarettes    Quit date: 1994    Years since quitting: 26.9  . Smokeless tobacco: Never Used  Substance and Sexual Activity  . Alcohol use: Yes    Frequency: Never    Comment: 10/13/2017 "glass of wine at Christmas"  . Drug use: Never  . Sexual activity: Yes  Lifestyle  . Physical activity    Days per week: Not on file    Minutes per session: Not on file  . Stress: Not on file  Relationships  . Social Herbalist on phone: Not on file    Gets together: Not on file    Attends religious service: Not on file    Active member of club or organization: Not on file    Attends meetings of clubs or organizations: Not on file    Relationship status: Not on file  Other Topics Concern  . Not on file  Social  History Narrative  . Not on file     Family History: The patient's family history includes Heart disease in her father; Stroke in her mother.  ROS:   Please see the history of present illness.    All other systems reviewed and are negative.  EKGs/Labs/Other Studies Reviewed:    The following studies were reviewed today: EKG reveals atrial fibrillation with fairly well-controlled ventricular rate   Recent Labs: No results found for requested  labs within last 8760 hours.  Recent Lipid Panel    Component Value Date/Time   CHOL 120 10/14/2017 0230   TRIG 39 10/14/2017 0230   HDL 67 10/14/2017 0230   CHOLHDL 1.8 10/14/2017 0230   VLDL 8 10/14/2017 0230   LDLCALC 45 10/14/2017 0230    Physical Exam:    VS:  BP 126/62 (BP Location: Left Arm, Patient Position: Sitting, Cuff Size: Normal)   Pulse 98   Ht 5' 1.5" (1.562 m)   Wt 143 lb (64.9 kg)   SpO2 97%   BMI 26.58 kg/m     Wt Readings from Last 3 Encounters:  03/02/19 143 lb (64.9 kg)  09/21/18 150 lb (68 kg)  08/14/18 159 lb (72.1 kg)     GEN: Patient is in no acute distress HEENT: Normal NECK: No JVD; No carotid bruits LYMPHATICS: No lymphadenopathy CARDIAC: Hear sounds irregular, 2/6 systolic murmur at the apex. RESPIRATORY:  Clear to auscultation without rales, wheezing or rhonchi  ABDOMEN: Soft, non-tender, non-distended MUSCULOSKELETAL:  No edema; No deformity  SKIN: Warm and dry NEUROLOGIC:  Alert and oriented x 3 PSYCHIATRIC:  Normal affect   Signed, Garwin Brothers, MD  03/02/2019 2:36 PM    [facility]

## 2019-03-07 DIAGNOSIS — I10 Essential (primary) hypertension: Secondary | ICD-10-CM | POA: Diagnosis not present

## 2019-03-07 DIAGNOSIS — H43813 Vitreous degeneration, bilateral: Secondary | ICD-10-CM | POA: Diagnosis not present

## 2019-03-07 DIAGNOSIS — H524 Presbyopia: Secondary | ICD-10-CM | POA: Diagnosis not present

## 2019-03-07 DIAGNOSIS — H25041 Posterior subcapsular polar age-related cataract, right eye: Secondary | ICD-10-CM | POA: Diagnosis not present

## 2019-03-07 DIAGNOSIS — H25812 Combined forms of age-related cataract, left eye: Secondary | ICD-10-CM | POA: Diagnosis not present

## 2019-03-07 DIAGNOSIS — H52223 Regular astigmatism, bilateral: Secondary | ICD-10-CM | POA: Diagnosis not present

## 2019-03-07 DIAGNOSIS — H5203 Hypermetropia, bilateral: Secondary | ICD-10-CM | POA: Diagnosis not present

## 2019-03-09 DIAGNOSIS — I2510 Atherosclerotic heart disease of native coronary artery without angina pectoris: Secondary | ICD-10-CM | POA: Diagnosis not present

## 2019-03-09 DIAGNOSIS — M159 Polyosteoarthritis, unspecified: Secondary | ICD-10-CM | POA: Diagnosis not present

## 2019-03-09 DIAGNOSIS — K5909 Other constipation: Secondary | ICD-10-CM | POA: Diagnosis not present

## 2019-03-09 DIAGNOSIS — I679 Cerebrovascular disease, unspecified: Secondary | ICD-10-CM | POA: Diagnosis not present

## 2019-04-06 DIAGNOSIS — I2510 Atherosclerotic heart disease of native coronary artery without angina pectoris: Secondary | ICD-10-CM | POA: Diagnosis not present

## 2019-04-06 DIAGNOSIS — M159 Polyosteoarthritis, unspecified: Secondary | ICD-10-CM | POA: Diagnosis not present

## 2019-04-06 DIAGNOSIS — K5909 Other constipation: Secondary | ICD-10-CM | POA: Diagnosis not present

## 2019-04-06 DIAGNOSIS — I679 Cerebrovascular disease, unspecified: Secondary | ICD-10-CM | POA: Diagnosis not present

## 2019-04-14 ENCOUNTER — Other Ambulatory Visit: Payer: Self-pay | Admitting: Cardiology

## 2019-04-20 DIAGNOSIS — I679 Cerebrovascular disease, unspecified: Secondary | ICD-10-CM | POA: Diagnosis not present

## 2019-04-20 DIAGNOSIS — M159 Polyosteoarthritis, unspecified: Secondary | ICD-10-CM | POA: Diagnosis not present

## 2019-04-20 DIAGNOSIS — I2510 Atherosclerotic heart disease of native coronary artery without angina pectoris: Secondary | ICD-10-CM | POA: Diagnosis not present

## 2019-04-20 DIAGNOSIS — E785 Hyperlipidemia, unspecified: Secondary | ICD-10-CM | POA: Diagnosis not present

## 2019-04-20 DIAGNOSIS — K5909 Other constipation: Secondary | ICD-10-CM | POA: Diagnosis not present

## 2019-04-20 DIAGNOSIS — I10 Essential (primary) hypertension: Secondary | ICD-10-CM | POA: Diagnosis not present

## 2019-04-27 ENCOUNTER — Telehealth: Payer: Self-pay | Admitting: Cardiology

## 2019-04-27 NOTE — Telephone Encounter (Signed)
New Message      *STAT* If patient is at the pharmacy, call can be transferred to refill team.   1. Which medications need to be refilled? (please list name of each medication and dose if known) Nitroglycerin   2. Which pharmacy/location (including street and city if local pharmacy) is medication to be sent to? [id number] - [place], [place] - [address]  3. Do they need a 30 day or 90 day supply? 30      Pt wanted to let Dr Tomie China know that she had her first Covid vaccine and that she will go back in 2 weeks for her second

## 2019-04-30 ENCOUNTER — Other Ambulatory Visit: Payer: Self-pay

## 2019-04-30 MED ORDER — NITROGLYCERIN 0.4 MG SL SUBL: 0.4000 mg | SUBLINGUAL_TABLET | SUBLINGUAL | 3 refills | Status: AC | PRN

## 2019-04-30 NOTE — Telephone Encounter (Signed)
Please advise. Is it ok to refill this medication? I went through the office notes from last year and didn't see it listed in her medications at all. I didn't want to move forward without confirming.

## 2019-04-30 NOTE — Progress Notes (Signed)
Sent rx to pharm per Dr. Tomie China for Nitroglycerin.

## 2019-04-30 NOTE — Telephone Encounter (Signed)
yes

## 2019-04-30 NOTE — Telephone Encounter (Signed)
Refilled per Dr. Tomie China.

## 2019-05-04 DIAGNOSIS — I679 Cerebrovascular disease, unspecified: Secondary | ICD-10-CM | POA: Diagnosis not present

## 2019-05-04 DIAGNOSIS — M159 Polyosteoarthritis, unspecified: Secondary | ICD-10-CM | POA: Diagnosis not present

## 2019-05-04 DIAGNOSIS — I2510 Atherosclerotic heart disease of native coronary artery without angina pectoris: Secondary | ICD-10-CM | POA: Diagnosis not present

## 2019-05-04 DIAGNOSIS — K5909 Other constipation: Secondary | ICD-10-CM | POA: Diagnosis not present

## 2019-05-15 ENCOUNTER — Other Ambulatory Visit: Payer: Self-pay

## 2019-05-15 DIAGNOSIS — M159 Polyosteoarthritis, unspecified: Secondary | ICD-10-CM | POA: Diagnosis not present

## 2019-05-15 DIAGNOSIS — K5909 Other constipation: Secondary | ICD-10-CM | POA: Diagnosis not present

## 2019-05-15 DIAGNOSIS — I2510 Atherosclerotic heart disease of native coronary artery without angina pectoris: Secondary | ICD-10-CM | POA: Diagnosis not present

## 2019-05-15 DIAGNOSIS — I679 Cerebrovascular disease, unspecified: Secondary | ICD-10-CM | POA: Diagnosis not present

## 2019-05-15 NOTE — Patient Outreach (Signed)
New referral to call patient:  Reviewed email. Noted that patient was having dizzy spells.    Placed call to patient who identified herself. Reports to me that she is doing well. Reports she got a fgood night sleep last night. Reports she had a virtual visit with MD today. Patient denies any needs or concerns today that I could assist with.    PLAN: close case as patient declines needs.   Rowe Pavy, RN, BSN, CEN [facility] [phone]

## 2019-06-01 DIAGNOSIS — I2510 Atherosclerotic heart disease of native coronary artery without angina pectoris: Secondary | ICD-10-CM | POA: Diagnosis not present

## 2019-06-01 DIAGNOSIS — M159 Polyosteoarthritis, unspecified: Secondary | ICD-10-CM | POA: Diagnosis not present

## 2019-06-01 DIAGNOSIS — J019 Acute sinusitis, unspecified: Secondary | ICD-10-CM | POA: Diagnosis not present

## 2019-06-01 DIAGNOSIS — K5909 Other constipation: Secondary | ICD-10-CM | POA: Diagnosis not present

## 2019-06-15 DIAGNOSIS — N390 Urinary tract infection, site not specified: Secondary | ICD-10-CM | POA: Diagnosis not present

## 2019-06-15 DIAGNOSIS — K5909 Other constipation: Secondary | ICD-10-CM | POA: Diagnosis not present

## 2019-06-15 DIAGNOSIS — M159 Polyosteoarthritis, unspecified: Secondary | ICD-10-CM | POA: Diagnosis not present

## 2019-06-15 DIAGNOSIS — I2510 Atherosclerotic heart disease of native coronary artery without angina pectoris: Secondary | ICD-10-CM | POA: Diagnosis not present

## 2019-06-22 DIAGNOSIS — K5909 Other constipation: Secondary | ICD-10-CM | POA: Diagnosis not present

## 2019-06-22 DIAGNOSIS — I679 Cerebrovascular disease, unspecified: Secondary | ICD-10-CM | POA: Diagnosis not present

## 2019-06-22 DIAGNOSIS — M159 Polyosteoarthritis, unspecified: Secondary | ICD-10-CM | POA: Diagnosis not present

## 2019-06-22 DIAGNOSIS — I2510 Atherosclerotic heart disease of native coronary artery without angina pectoris: Secondary | ICD-10-CM | POA: Diagnosis not present

## 2019-07-10 DIAGNOSIS — M159 Polyosteoarthritis, unspecified: Secondary | ICD-10-CM | POA: Diagnosis not present

## 2019-07-10 DIAGNOSIS — B0229 Other postherpetic nervous system involvement: Secondary | ICD-10-CM | POA: Diagnosis not present

## 2019-07-10 DIAGNOSIS — I2510 Atherosclerotic heart disease of native coronary artery without angina pectoris: Secondary | ICD-10-CM | POA: Diagnosis not present

## 2019-07-10 DIAGNOSIS — K5909 Other constipation: Secondary | ICD-10-CM | POA: Diagnosis not present

## 2019-07-13 DIAGNOSIS — M159 Polyosteoarthritis, unspecified: Secondary | ICD-10-CM | POA: Diagnosis not present

## 2019-07-13 DIAGNOSIS — I2510 Atherosclerotic heart disease of native coronary artery without angina pectoris: Secondary | ICD-10-CM | POA: Diagnosis not present

## 2019-07-13 DIAGNOSIS — B0229 Other postherpetic nervous system involvement: Secondary | ICD-10-CM | POA: Diagnosis not present

## 2019-07-13 DIAGNOSIS — K5909 Other constipation: Secondary | ICD-10-CM | POA: Diagnosis not present

## 2019-07-25 DIAGNOSIS — I2510 Atherosclerotic heart disease of native coronary artery without angina pectoris: Secondary | ICD-10-CM | POA: Diagnosis not present

## 2019-07-25 DIAGNOSIS — K5909 Other constipation: Secondary | ICD-10-CM | POA: Diagnosis not present

## 2019-07-25 DIAGNOSIS — B0229 Other postherpetic nervous system involvement: Secondary | ICD-10-CM | POA: Diagnosis not present

## 2019-07-25 DIAGNOSIS — M159 Polyosteoarthritis, unspecified: Secondary | ICD-10-CM | POA: Diagnosis not present

## 2019-08-08 DIAGNOSIS — E785 Hyperlipidemia, unspecified: Secondary | ICD-10-CM | POA: Diagnosis not present

## 2019-08-08 DIAGNOSIS — Z1331 Encounter for screening for depression: Secondary | ICD-10-CM | POA: Diagnosis not present

## 2019-08-08 DIAGNOSIS — Z9181 History of falling: Secondary | ICD-10-CM | POA: Diagnosis not present

## 2019-08-08 DIAGNOSIS — Z0000 Encounter for general adult medical examination without abnormal findings: Secondary | ICD-10-CM | POA: Diagnosis not present

## 2019-08-10 DIAGNOSIS — B0229 Other postherpetic nervous system involvement: Secondary | ICD-10-CM | POA: Diagnosis not present

## 2019-08-10 DIAGNOSIS — I10 Essential (primary) hypertension: Secondary | ICD-10-CM | POA: Diagnosis not present

## 2019-08-10 DIAGNOSIS — I2510 Atherosclerotic heart disease of native coronary artery without angina pectoris: Secondary | ICD-10-CM | POA: Diagnosis not present

## 2019-08-10 DIAGNOSIS — K5909 Other constipation: Secondary | ICD-10-CM | POA: Diagnosis not present

## 2019-08-10 DIAGNOSIS — E785 Hyperlipidemia, unspecified: Secondary | ICD-10-CM | POA: Diagnosis not present

## 2019-08-10 DIAGNOSIS — M25511 Pain in right shoulder: Secondary | ICD-10-CM | POA: Diagnosis not present

## 2019-08-14 DIAGNOSIS — H25811 Combined forms of age-related cataract, right eye: Secondary | ICD-10-CM | POA: Diagnosis not present

## 2019-08-14 DIAGNOSIS — Z01818 Encounter for other preprocedural examination: Secondary | ICD-10-CM | POA: Diagnosis not present

## 2019-08-21 DIAGNOSIS — M1990 Unspecified osteoarthritis, unspecified site: Secondary | ICD-10-CM | POA: Diagnosis not present

## 2019-08-21 DIAGNOSIS — I252 Old myocardial infarction: Secondary | ICD-10-CM | POA: Diagnosis not present

## 2019-08-21 DIAGNOSIS — Z79899 Other long term (current) drug therapy: Secondary | ICD-10-CM | POA: Diagnosis not present

## 2019-08-21 DIAGNOSIS — Z955 Presence of coronary angioplasty implant and graft: Secondary | ICD-10-CM | POA: Diagnosis not present

## 2019-08-21 DIAGNOSIS — Z8673 Personal history of transient ischemic attack (TIA), and cerebral infarction without residual deficits: Secondary | ICD-10-CM | POA: Diagnosis not present

## 2019-08-21 DIAGNOSIS — Z7982 Long term (current) use of aspirin: Secondary | ICD-10-CM | POA: Diagnosis not present

## 2019-08-21 DIAGNOSIS — H5203 Hypermetropia, bilateral: Secondary | ICD-10-CM | POA: Diagnosis not present

## 2019-08-21 DIAGNOSIS — Z951 Presence of aortocoronary bypass graft: Secondary | ICD-10-CM | POA: Diagnosis not present

## 2019-08-21 DIAGNOSIS — H524 Presbyopia: Secondary | ICD-10-CM | POA: Diagnosis not present

## 2019-08-21 DIAGNOSIS — Z961 Presence of intraocular lens: Secondary | ICD-10-CM | POA: Diagnosis not present

## 2019-08-21 DIAGNOSIS — H52223 Regular astigmatism, bilateral: Secondary | ICD-10-CM | POA: Diagnosis not present

## 2019-08-21 DIAGNOSIS — H40003 Preglaucoma, unspecified, bilateral: Secondary | ICD-10-CM | POA: Diagnosis not present

## 2019-08-21 DIAGNOSIS — Z9841 Cataract extraction status, right eye: Secondary | ICD-10-CM | POA: Diagnosis not present

## 2019-08-21 DIAGNOSIS — I480 Paroxysmal atrial fibrillation: Secondary | ICD-10-CM | POA: Diagnosis not present

## 2019-08-21 DIAGNOSIS — H259 Unspecified age-related cataract: Secondary | ICD-10-CM | POA: Diagnosis not present

## 2019-08-21 DIAGNOSIS — I10 Essential (primary) hypertension: Secondary | ICD-10-CM | POA: Diagnosis not present

## 2019-08-21 DIAGNOSIS — Z7901 Long term (current) use of anticoagulants: Secondary | ICD-10-CM | POA: Diagnosis not present

## 2019-08-21 DIAGNOSIS — Z87891 Personal history of nicotine dependence: Secondary | ICD-10-CM | POA: Diagnosis not present

## 2019-08-21 DIAGNOSIS — H25811 Combined forms of age-related cataract, right eye: Secondary | ICD-10-CM | POA: Diagnosis not present

## 2019-08-21 DIAGNOSIS — E785 Hyperlipidemia, unspecified: Secondary | ICD-10-CM | POA: Diagnosis not present

## 2019-09-04 DIAGNOSIS — B0229 Other postherpetic nervous system involvement: Secondary | ICD-10-CM | POA: Diagnosis not present

## 2019-09-04 DIAGNOSIS — Z6829 Body mass index (BMI) 29.0-29.9, adult: Secondary | ICD-10-CM | POA: Diagnosis not present

## 2019-09-04 DIAGNOSIS — I2510 Atherosclerotic heart disease of native coronary artery without angina pectoris: Secondary | ICD-10-CM | POA: Diagnosis not present

## 2019-09-04 DIAGNOSIS — E669 Obesity, unspecified: Secondary | ICD-10-CM | POA: Diagnosis not present

## 2019-09-04 DIAGNOSIS — K5909 Other constipation: Secondary | ICD-10-CM | POA: Diagnosis not present

## 2019-09-04 DIAGNOSIS — I679 Cerebrovascular disease, unspecified: Secondary | ICD-10-CM | POA: Diagnosis not present

## 2019-10-30 DIAGNOSIS — E559 Vitamin D deficiency, unspecified: Secondary | ICD-10-CM | POA: Diagnosis not present

## 2019-10-30 DIAGNOSIS — R42 Dizziness and giddiness: Secondary | ICD-10-CM | POA: Diagnosis not present

## 2019-10-30 DIAGNOSIS — R110 Nausea: Secondary | ICD-10-CM | POA: Diagnosis not present

## 2019-10-30 DIAGNOSIS — M79671 Pain in right foot: Secondary | ICD-10-CM | POA: Diagnosis not present

## 2019-10-30 DIAGNOSIS — K219 Gastro-esophageal reflux disease without esophagitis: Secondary | ICD-10-CM | POA: Diagnosis not present

## 2019-10-30 DIAGNOSIS — B0229 Other postherpetic nervous system involvement: Secondary | ICD-10-CM | POA: Diagnosis not present

## 2019-10-30 DIAGNOSIS — I2510 Atherosclerotic heart disease of native coronary artery without angina pectoris: Secondary | ICD-10-CM | POA: Diagnosis not present

## 2019-10-30 DIAGNOSIS — I679 Cerebrovascular disease, unspecified: Secondary | ICD-10-CM | POA: Diagnosis not present

## 2019-10-30 DIAGNOSIS — E669 Obesity, unspecified: Secondary | ICD-10-CM | POA: Diagnosis not present

## 2019-10-30 DIAGNOSIS — K5909 Other constipation: Secondary | ICD-10-CM | POA: Diagnosis not present

## 2019-10-30 DIAGNOSIS — M810 Age-related osteoporosis without current pathological fracture: Secondary | ICD-10-CM | POA: Diagnosis not present

## 2019-10-30 DIAGNOSIS — R600 Localized edema: Secondary | ICD-10-CM | POA: Diagnosis not present

## 2019-10-31 DIAGNOSIS — R110 Nausea: Secondary | ICD-10-CM | POA: Diagnosis not present

## 2019-10-31 DIAGNOSIS — E559 Vitamin D deficiency, unspecified: Secondary | ICD-10-CM | POA: Diagnosis not present

## 2019-10-31 DIAGNOSIS — J0100 Acute maxillary sinusitis, unspecified: Secondary | ICD-10-CM | POA: Diagnosis not present

## 2019-10-31 DIAGNOSIS — R42 Dizziness and giddiness: Secondary | ICD-10-CM | POA: Diagnosis not present

## 2019-10-31 DIAGNOSIS — M79671 Pain in right foot: Secondary | ICD-10-CM | POA: Diagnosis not present

## 2019-10-31 DIAGNOSIS — R600 Localized edema: Secondary | ICD-10-CM | POA: Diagnosis not present

## 2019-10-31 DIAGNOSIS — M810 Age-related osteoporosis without current pathological fracture: Secondary | ICD-10-CM | POA: Diagnosis not present

## 2019-10-31 DIAGNOSIS — E669 Obesity, unspecified: Secondary | ICD-10-CM | POA: Diagnosis not present

## 2019-10-31 DIAGNOSIS — B0229 Other postherpetic nervous system involvement: Secondary | ICD-10-CM | POA: Diagnosis not present

## 2019-10-31 DIAGNOSIS — I679 Cerebrovascular disease, unspecified: Secondary | ICD-10-CM | POA: Diagnosis not present

## 2019-10-31 DIAGNOSIS — K5909 Other constipation: Secondary | ICD-10-CM | POA: Diagnosis not present

## 2019-10-31 DIAGNOSIS — I2510 Atherosclerotic heart disease of native coronary artery without angina pectoris: Secondary | ICD-10-CM | POA: Diagnosis not present

## 2019-11-15 ENCOUNTER — Other Ambulatory Visit: Payer: Self-pay | Admitting: Cardiology

## 2019-11-21 DIAGNOSIS — R600 Localized edema: Secondary | ICD-10-CM | POA: Diagnosis not present

## 2019-11-21 DIAGNOSIS — R110 Nausea: Secondary | ICD-10-CM | POA: Diagnosis not present

## 2019-11-21 DIAGNOSIS — I679 Cerebrovascular disease, unspecified: Secondary | ICD-10-CM | POA: Diagnosis not present

## 2019-11-21 DIAGNOSIS — K589 Irritable bowel syndrome without diarrhea: Secondary | ICD-10-CM | POA: Diagnosis not present

## 2019-11-21 DIAGNOSIS — M79671 Pain in right foot: Secondary | ICD-10-CM | POA: Diagnosis not present

## 2019-11-21 DIAGNOSIS — I2510 Atherosclerotic heart disease of native coronary artery without angina pectoris: Secondary | ICD-10-CM | POA: Diagnosis not present

## 2019-11-21 DIAGNOSIS — E559 Vitamin D deficiency, unspecified: Secondary | ICD-10-CM | POA: Diagnosis not present

## 2019-11-21 DIAGNOSIS — K5909 Other constipation: Secondary | ICD-10-CM | POA: Diagnosis not present

## 2019-11-21 DIAGNOSIS — R42 Dizziness and giddiness: Secondary | ICD-10-CM | POA: Diagnosis not present

## 2019-11-21 DIAGNOSIS — E669 Obesity, unspecified: Secondary | ICD-10-CM | POA: Diagnosis not present

## 2019-11-21 DIAGNOSIS — M810 Age-related osteoporosis without current pathological fracture: Secondary | ICD-10-CM | POA: Diagnosis not present

## 2019-11-21 DIAGNOSIS — B0229 Other postherpetic nervous system involvement: Secondary | ICD-10-CM | POA: Diagnosis not present

## 2019-11-27 DIAGNOSIS — E669 Obesity, unspecified: Secondary | ICD-10-CM | POA: Diagnosis not present

## 2019-11-27 DIAGNOSIS — R600 Localized edema: Secondary | ICD-10-CM | POA: Diagnosis not present

## 2019-11-27 DIAGNOSIS — K589 Irritable bowel syndrome without diarrhea: Secondary | ICD-10-CM | POA: Diagnosis not present

## 2019-11-27 DIAGNOSIS — I679 Cerebrovascular disease, unspecified: Secondary | ICD-10-CM | POA: Diagnosis not present

## 2019-11-27 DIAGNOSIS — B0229 Other postherpetic nervous system involvement: Secondary | ICD-10-CM | POA: Diagnosis not present

## 2019-11-27 DIAGNOSIS — M810 Age-related osteoporosis without current pathological fracture: Secondary | ICD-10-CM | POA: Diagnosis not present

## 2019-11-27 DIAGNOSIS — K5909 Other constipation: Secondary | ICD-10-CM | POA: Diagnosis not present

## 2019-11-27 DIAGNOSIS — R110 Nausea: Secondary | ICD-10-CM | POA: Diagnosis not present

## 2019-11-27 DIAGNOSIS — I2510 Atherosclerotic heart disease of native coronary artery without angina pectoris: Secondary | ICD-10-CM | POA: Diagnosis not present

## 2019-11-27 DIAGNOSIS — M79671 Pain in right foot: Secondary | ICD-10-CM | POA: Diagnosis not present

## 2019-11-27 DIAGNOSIS — E559 Vitamin D deficiency, unspecified: Secondary | ICD-10-CM | POA: Diagnosis not present

## 2019-11-27 DIAGNOSIS — R42 Dizziness and giddiness: Secondary | ICD-10-CM | POA: Diagnosis not present

## 2019-12-05 DIAGNOSIS — I2510 Atherosclerotic heart disease of native coronary artery without angina pectoris: Secondary | ICD-10-CM | POA: Diagnosis not present

## 2019-12-05 DIAGNOSIS — B0229 Other postherpetic nervous system involvement: Secondary | ICD-10-CM | POA: Diagnosis not present

## 2019-12-05 DIAGNOSIS — M810 Age-related osteoporosis without current pathological fracture: Secondary | ICD-10-CM | POA: Diagnosis not present

## 2019-12-05 DIAGNOSIS — R110 Nausea: Secondary | ICD-10-CM | POA: Diagnosis not present

## 2019-12-05 DIAGNOSIS — E559 Vitamin D deficiency, unspecified: Secondary | ICD-10-CM | POA: Diagnosis not present

## 2019-12-05 DIAGNOSIS — R600 Localized edema: Secondary | ICD-10-CM | POA: Diagnosis not present

## 2019-12-05 DIAGNOSIS — M79671 Pain in right foot: Secondary | ICD-10-CM | POA: Diagnosis not present

## 2019-12-05 DIAGNOSIS — I679 Cerebrovascular disease, unspecified: Secondary | ICD-10-CM | POA: Diagnosis not present

## 2019-12-05 DIAGNOSIS — R42 Dizziness and giddiness: Secondary | ICD-10-CM | POA: Diagnosis not present

## 2019-12-05 DIAGNOSIS — E669 Obesity, unspecified: Secondary | ICD-10-CM | POA: Diagnosis not present

## 2019-12-05 DIAGNOSIS — K589 Irritable bowel syndrome without diarrhea: Secondary | ICD-10-CM | POA: Diagnosis not present

## 2019-12-05 DIAGNOSIS — K5909 Other constipation: Secondary | ICD-10-CM | POA: Diagnosis not present

## 2019-12-11 ENCOUNTER — Ambulatory Visit: Payer: Medicare Other | Admitting: Cardiology

## 2020-01-02 DIAGNOSIS — I2510 Atherosclerotic heart disease of native coronary artery without angina pectoris: Secondary | ICD-10-CM | POA: Diagnosis not present

## 2020-01-02 DIAGNOSIS — B0229 Other postherpetic nervous system involvement: Secondary | ICD-10-CM | POA: Diagnosis not present

## 2020-01-02 DIAGNOSIS — M810 Age-related osteoporosis without current pathological fracture: Secondary | ICD-10-CM | POA: Diagnosis not present

## 2020-01-02 DIAGNOSIS — Z23 Encounter for immunization: Secondary | ICD-10-CM | POA: Diagnosis not present

## 2020-01-02 DIAGNOSIS — R110 Nausea: Secondary | ICD-10-CM | POA: Diagnosis not present

## 2020-01-02 DIAGNOSIS — K5909 Other constipation: Secondary | ICD-10-CM | POA: Diagnosis not present

## 2020-01-02 DIAGNOSIS — E785 Hyperlipidemia, unspecified: Secondary | ICD-10-CM | POA: Diagnosis not present

## 2020-01-02 DIAGNOSIS — I10 Essential (primary) hypertension: Secondary | ICD-10-CM | POA: Diagnosis not present

## 2020-01-02 DIAGNOSIS — K589 Irritable bowel syndrome without diarrhea: Secondary | ICD-10-CM | POA: Diagnosis not present

## 2020-01-02 DIAGNOSIS — I679 Cerebrovascular disease, unspecified: Secondary | ICD-10-CM | POA: Diagnosis not present

## 2020-01-02 DIAGNOSIS — M79671 Pain in right foot: Secondary | ICD-10-CM | POA: Diagnosis not present

## 2020-01-02 DIAGNOSIS — R600 Localized edema: Secondary | ICD-10-CM | POA: Diagnosis not present

## 2020-01-02 DIAGNOSIS — E559 Vitamin D deficiency, unspecified: Secondary | ICD-10-CM | POA: Diagnosis not present

## 2020-01-02 DIAGNOSIS — R42 Dizziness and giddiness: Secondary | ICD-10-CM | POA: Diagnosis not present

## 2020-01-03 DIAGNOSIS — T40605A Adverse effect of unspecified narcotics, initial encounter: Secondary | ICD-10-CM | POA: Diagnosis not present

## 2020-01-03 DIAGNOSIS — Z7401 Bed confinement status: Secondary | ICD-10-CM | POA: Diagnosis not present

## 2020-01-03 DIAGNOSIS — K219 Gastro-esophageal reflux disease without esophagitis: Secondary | ICD-10-CM | POA: Diagnosis present

## 2020-01-03 DIAGNOSIS — Z8673 Personal history of transient ischemic attack (TIA), and cerebral infarction without residual deficits: Secondary | ICD-10-CM | POA: Diagnosis not present

## 2020-01-03 DIAGNOSIS — M6281 Muscle weakness (generalized): Secondary | ICD-10-CM | POA: Diagnosis not present

## 2020-01-03 DIAGNOSIS — R29818 Other symptoms and signs involving the nervous system: Secondary | ICD-10-CM | POA: Diagnosis not present

## 2020-01-03 DIAGNOSIS — M25552 Pain in left hip: Secondary | ICD-10-CM | POA: Diagnosis not present

## 2020-01-03 DIAGNOSIS — I361 Nonrheumatic tricuspid (valve) insufficiency: Secondary | ICD-10-CM | POA: Diagnosis not present

## 2020-01-03 DIAGNOSIS — G9389 Other specified disorders of brain: Secondary | ICD-10-CM | POA: Diagnosis not present

## 2020-01-03 DIAGNOSIS — I6389 Other cerebral infarction: Secondary | ICD-10-CM | POA: Diagnosis not present

## 2020-01-03 DIAGNOSIS — E785 Hyperlipidemia, unspecified: Secondary | ICD-10-CM | POA: Diagnosis present

## 2020-01-03 DIAGNOSIS — F329 Major depressive disorder, single episode, unspecified: Secondary | ICD-10-CM | POA: Diagnosis present

## 2020-01-03 DIAGNOSIS — I69951 Hemiplegia and hemiparesis following unspecified cerebrovascular disease affecting right dominant side: Secondary | ICD-10-CM | POA: Diagnosis not present

## 2020-01-03 DIAGNOSIS — I5033 Acute on chronic diastolic (congestive) heart failure: Secondary | ICD-10-CM | POA: Diagnosis not present

## 2020-01-03 DIAGNOSIS — E669 Obesity, unspecified: Secondary | ICD-10-CM | POA: Diagnosis present

## 2020-01-03 DIAGNOSIS — K5903 Drug induced constipation: Secondary | ICD-10-CM | POA: Diagnosis not present

## 2020-01-03 DIAGNOSIS — G9341 Metabolic encephalopathy: Secondary | ICD-10-CM | POA: Diagnosis not present

## 2020-01-03 DIAGNOSIS — M160 Bilateral primary osteoarthritis of hip: Secondary | ICD-10-CM | POA: Diagnosis not present

## 2020-01-03 DIAGNOSIS — B0229 Other postherpetic nervous system involvement: Secondary | ICD-10-CM | POA: Diagnosis not present

## 2020-01-03 DIAGNOSIS — I252 Old myocardial infarction: Secondary | ICD-10-CM | POA: Diagnosis not present

## 2020-01-03 DIAGNOSIS — R410 Disorientation, unspecified: Secondary | ICD-10-CM | POA: Diagnosis not present

## 2020-01-03 DIAGNOSIS — F419 Anxiety disorder, unspecified: Secondary | ICD-10-CM | POA: Diagnosis present

## 2020-01-03 DIAGNOSIS — D62 Acute posthemorrhagic anemia: Secondary | ICD-10-CM | POA: Diagnosis not present

## 2020-01-03 DIAGNOSIS — N189 Chronic kidney disease, unspecified: Secondary | ICD-10-CM | POA: Diagnosis not present

## 2020-01-03 DIAGNOSIS — N170 Acute kidney failure with tubular necrosis: Secondary | ICD-10-CM | POA: Diagnosis not present

## 2020-01-03 DIAGNOSIS — I340 Nonrheumatic mitral (valve) insufficiency: Secondary | ICD-10-CM | POA: Diagnosis not present

## 2020-01-03 DIAGNOSIS — Z951 Presence of aortocoronary bypass graft: Secondary | ICD-10-CM | POA: Diagnosis not present

## 2020-01-03 DIAGNOSIS — E860 Dehydration: Secondary | ICD-10-CM | POA: Diagnosis present

## 2020-01-03 DIAGNOSIS — S79922A Unspecified injury of left thigh, initial encounter: Secondary | ICD-10-CM | POA: Diagnosis not present

## 2020-01-03 DIAGNOSIS — S72142D Displaced intertrochanteric fracture of left femur, subsequent encounter for closed fracture with routine healing: Secondary | ICD-10-CM | POA: Diagnosis not present

## 2020-01-03 DIAGNOSIS — E7800 Pure hypercholesterolemia, unspecified: Secondary | ICD-10-CM | POA: Diagnosis present

## 2020-01-03 DIAGNOSIS — I672 Cerebral atherosclerosis: Secondary | ICD-10-CM | POA: Diagnosis not present

## 2020-01-03 DIAGNOSIS — M810 Age-related osteoporosis without current pathological fracture: Secondary | ICD-10-CM | POA: Diagnosis not present

## 2020-01-03 DIAGNOSIS — R52 Pain, unspecified: Secondary | ICD-10-CM | POA: Diagnosis not present

## 2020-01-03 DIAGNOSIS — R278 Other lack of coordination: Secondary | ICD-10-CM | POA: Diagnosis not present

## 2020-01-03 DIAGNOSIS — Z4789 Encounter for other orthopedic aftercare: Secondary | ICD-10-CM | POA: Diagnosis not present

## 2020-01-03 DIAGNOSIS — I2510 Atherosclerotic heart disease of native coronary artery without angina pectoris: Secondary | ICD-10-CM | POA: Diagnosis present

## 2020-01-03 DIAGNOSIS — S72142A Displaced intertrochanteric fracture of left femur, initial encounter for closed fracture: Secondary | ICD-10-CM | POA: Diagnosis not present

## 2020-01-03 DIAGNOSIS — I480 Paroxysmal atrial fibrillation: Secondary | ICD-10-CM | POA: Diagnosis not present

## 2020-01-03 DIAGNOSIS — R0902 Hypoxemia: Secondary | ICD-10-CM | POA: Diagnosis not present

## 2020-01-03 DIAGNOSIS — W19XXXD Unspecified fall, subsequent encounter: Secondary | ICD-10-CM | POA: Diagnosis not present

## 2020-01-03 DIAGNOSIS — R2681 Unsteadiness on feet: Secondary | ICD-10-CM | POA: Diagnosis not present

## 2020-01-03 DIAGNOSIS — D500 Iron deficiency anemia secondary to blood loss (chronic): Secondary | ICD-10-CM | POA: Diagnosis not present

## 2020-01-03 DIAGNOSIS — I4891 Unspecified atrial fibrillation: Secondary | ICD-10-CM | POA: Diagnosis not present

## 2020-01-03 DIAGNOSIS — L89159 Pressure ulcer of sacral region, unspecified stage: Secondary | ICD-10-CM | POA: Diagnosis not present

## 2020-01-03 DIAGNOSIS — J90 Pleural effusion, not elsewhere classified: Secondary | ICD-10-CM | POA: Diagnosis not present

## 2020-01-03 DIAGNOSIS — J189 Pneumonia, unspecified organism: Secondary | ICD-10-CM | POA: Diagnosis not present

## 2020-01-03 DIAGNOSIS — R339 Retention of urine, unspecified: Secondary | ICD-10-CM | POA: Diagnosis not present

## 2020-01-03 DIAGNOSIS — R41841 Cognitive communication deficit: Secondary | ICD-10-CM | POA: Diagnosis not present

## 2020-01-03 DIAGNOSIS — I110 Hypertensive heart disease with heart failure: Secondary | ICD-10-CM | POA: Diagnosis present

## 2020-01-03 DIAGNOSIS — S2231XA Fracture of one rib, right side, initial encounter for closed fracture: Secondary | ICD-10-CM | POA: Diagnosis not present

## 2020-01-03 DIAGNOSIS — M2550 Pain in unspecified joint: Secondary | ICD-10-CM | POA: Diagnosis not present

## 2020-01-03 DIAGNOSIS — Z885 Allergy status to narcotic agent status: Secondary | ICD-10-CM | POA: Diagnosis not present

## 2020-01-03 DIAGNOSIS — J9601 Acute respiratory failure with hypoxia: Secondary | ICD-10-CM | POA: Diagnosis not present

## 2020-01-03 DIAGNOSIS — I517 Cardiomegaly: Secondary | ICD-10-CM | POA: Diagnosis not present

## 2020-01-03 DIAGNOSIS — S72122A Displaced fracture of lesser trochanter of left femur, initial encounter for closed fracture: Secondary | ICD-10-CM | POA: Diagnosis not present

## 2020-01-03 DIAGNOSIS — W19XXXA Unspecified fall, initial encounter: Secondary | ICD-10-CM | POA: Diagnosis not present

## 2020-01-03 DIAGNOSIS — I10 Essential (primary) hypertension: Secondary | ICD-10-CM | POA: Diagnosis not present

## 2020-01-03 DIAGNOSIS — J9811 Atelectasis: Secondary | ICD-10-CM | POA: Diagnosis not present

## 2020-01-04 DIAGNOSIS — S72142A Displaced intertrochanteric fracture of left femur, initial encounter for closed fracture: Secondary | ICD-10-CM | POA: Diagnosis not present

## 2020-01-04 DIAGNOSIS — S72122A Displaced fracture of lesser trochanter of left femur, initial encounter for closed fracture: Secondary | ICD-10-CM | POA: Diagnosis not present

## 2020-01-04 DIAGNOSIS — I4891 Unspecified atrial fibrillation: Secondary | ICD-10-CM | POA: Diagnosis not present

## 2020-01-04 DIAGNOSIS — I10 Essential (primary) hypertension: Secondary | ICD-10-CM | POA: Diagnosis not present

## 2020-01-07 DIAGNOSIS — R29818 Other symptoms and signs involving the nervous system: Secondary | ICD-10-CM | POA: Diagnosis not present

## 2020-01-07 DIAGNOSIS — M25552 Pain in left hip: Secondary | ICD-10-CM

## 2020-01-07 DIAGNOSIS — I6389 Other cerebral infarction: Secondary | ICD-10-CM | POA: Diagnosis not present

## 2020-01-07 DIAGNOSIS — G9389 Other specified disorders of brain: Secondary | ICD-10-CM | POA: Diagnosis not present

## 2020-01-07 DIAGNOSIS — I672 Cerebral atherosclerosis: Secondary | ICD-10-CM | POA: Diagnosis not present

## 2020-01-08 DIAGNOSIS — I361 Nonrheumatic tricuspid (valve) insufficiency: Secondary | ICD-10-CM | POA: Diagnosis not present

## 2020-01-08 DIAGNOSIS — J90 Pleural effusion, not elsewhere classified: Secondary | ICD-10-CM | POA: Diagnosis not present

## 2020-01-08 DIAGNOSIS — I517 Cardiomegaly: Secondary | ICD-10-CM | POA: Diagnosis not present

## 2020-01-08 DIAGNOSIS — J189 Pneumonia, unspecified organism: Secondary | ICD-10-CM | POA: Diagnosis not present

## 2020-01-08 DIAGNOSIS — I340 Nonrheumatic mitral (valve) insufficiency: Secondary | ICD-10-CM | POA: Diagnosis not present

## 2020-01-09 DIAGNOSIS — M810 Age-related osteoporosis without current pathological fracture: Secondary | ICD-10-CM | POA: Diagnosis not present

## 2020-01-09 DIAGNOSIS — E7800 Pure hypercholesterolemia, unspecified: Secondary | ICD-10-CM | POA: Diagnosis present

## 2020-01-09 DIAGNOSIS — N390 Urinary tract infection, site not specified: Secondary | ICD-10-CM | POA: Diagnosis present

## 2020-01-09 DIAGNOSIS — M6281 Muscle weakness (generalized): Secondary | ICD-10-CM | POA: Diagnosis not present

## 2020-01-09 DIAGNOSIS — L8994 Pressure ulcer of unspecified site, stage 4: Secondary | ICD-10-CM | POA: Diagnosis not present

## 2020-01-09 DIAGNOSIS — M4856XA Collapsed vertebra, not elsewhere classified, lumbar region, initial encounter for fracture: Secondary | ICD-10-CM | POA: Diagnosis present

## 2020-01-09 DIAGNOSIS — W19XXXD Unspecified fall, subsequent encounter: Secondary | ICD-10-CM | POA: Diagnosis not present

## 2020-01-09 DIAGNOSIS — M25552 Pain in left hip: Secondary | ICD-10-CM | POA: Diagnosis not present

## 2020-01-09 DIAGNOSIS — I5033 Acute on chronic diastolic (congestive) heart failure: Secondary | ICD-10-CM | POA: Diagnosis present

## 2020-01-09 DIAGNOSIS — L89156 Pressure-induced deep tissue damage of sacral region: Secondary | ICD-10-CM | POA: Diagnosis not present

## 2020-01-09 DIAGNOSIS — D689 Coagulation defect, unspecified: Secondary | ICD-10-CM | POA: Diagnosis present

## 2020-01-09 DIAGNOSIS — R52 Pain, unspecified: Secondary | ICD-10-CM | POA: Diagnosis not present

## 2020-01-09 DIAGNOSIS — A419 Sepsis, unspecified organism: Secondary | ICD-10-CM | POA: Diagnosis not present

## 2020-01-09 DIAGNOSIS — K219 Gastro-esophageal reflux disease without esophagitis: Secondary | ICD-10-CM | POA: Diagnosis present

## 2020-01-09 DIAGNOSIS — B0229 Other postherpetic nervous system involvement: Secondary | ICD-10-CM | POA: Diagnosis not present

## 2020-01-09 DIAGNOSIS — R404 Transient alteration of awareness: Secondary | ICD-10-CM | POA: Diagnosis not present

## 2020-01-09 DIAGNOSIS — Z8673 Personal history of transient ischemic attack (TIA), and cerebral infarction without residual deficits: Secondary | ICD-10-CM | POA: Diagnosis not present

## 2020-01-09 DIAGNOSIS — R0602 Shortness of breath: Secondary | ICD-10-CM | POA: Diagnosis not present

## 2020-01-09 DIAGNOSIS — I2510 Atherosclerotic heart disease of native coronary artery without angina pectoris: Secondary | ICD-10-CM | POA: Diagnosis present

## 2020-01-09 DIAGNOSIS — R29810 Facial weakness: Secondary | ICD-10-CM | POA: Diagnosis not present

## 2020-01-09 DIAGNOSIS — I69951 Hemiplegia and hemiparesis following unspecified cerebrovascular disease affecting right dominant side: Secondary | ICD-10-CM | POA: Diagnosis not present

## 2020-01-09 DIAGNOSIS — E785 Hyperlipidemia, unspecified: Secondary | ICD-10-CM | POA: Diagnosis present

## 2020-01-09 DIAGNOSIS — R0902 Hypoxemia: Secondary | ICD-10-CM | POA: Diagnosis not present

## 2020-01-09 DIAGNOSIS — Z7401 Bed confinement status: Secondary | ICD-10-CM | POA: Diagnosis not present

## 2020-01-09 DIAGNOSIS — S72142D Displaced intertrochanteric fracture of left femur, subsequent encounter for closed fracture with routine healing: Secondary | ICD-10-CM | POA: Diagnosis not present

## 2020-01-09 DIAGNOSIS — G8918 Other acute postprocedural pain: Secondary | ICD-10-CM | POA: Diagnosis not present

## 2020-01-09 DIAGNOSIS — R109 Unspecified abdominal pain: Secondary | ICD-10-CM | POA: Diagnosis not present

## 2020-01-09 DIAGNOSIS — I480 Paroxysmal atrial fibrillation: Secondary | ICD-10-CM | POA: Diagnosis present

## 2020-01-09 DIAGNOSIS — I110 Hypertensive heart disease with heart failure: Secondary | ICD-10-CM | POA: Diagnosis present

## 2020-01-09 DIAGNOSIS — I959 Hypotension, unspecified: Secondary | ICD-10-CM | POA: Diagnosis not present

## 2020-01-09 DIAGNOSIS — F0390 Unspecified dementia without behavioral disturbance: Secondary | ICD-10-CM | POA: Diagnosis present

## 2020-01-09 DIAGNOSIS — G9341 Metabolic encephalopathy: Secondary | ICD-10-CM | POA: Diagnosis present

## 2020-01-09 DIAGNOSIS — F32A Depression, unspecified: Secondary | ICD-10-CM | POA: Diagnosis present

## 2020-01-09 DIAGNOSIS — L89154 Pressure ulcer of sacral region, stage 4: Secondary | ICD-10-CM | POA: Diagnosis present

## 2020-01-09 DIAGNOSIS — R41841 Cognitive communication deficit: Secondary | ICD-10-CM | POA: Diagnosis not present

## 2020-01-09 DIAGNOSIS — M2550 Pain in unspecified joint: Secondary | ICD-10-CM | POA: Diagnosis not present

## 2020-01-09 DIAGNOSIS — D500 Iron deficiency anemia secondary to blood loss (chronic): Secondary | ICD-10-CM | POA: Diagnosis not present

## 2020-01-09 DIAGNOSIS — R6521 Severe sepsis with septic shock: Secondary | ICD-10-CM | POA: Diagnosis not present

## 2020-01-09 DIAGNOSIS — L89150 Pressure ulcer of sacral region, unstageable: Secondary | ICD-10-CM | POA: Diagnosis not present

## 2020-01-09 DIAGNOSIS — K838 Other specified diseases of biliary tract: Secondary | ICD-10-CM | POA: Diagnosis present

## 2020-01-09 DIAGNOSIS — R2681 Unsteadiness on feet: Secondary | ICD-10-CM | POA: Diagnosis not present

## 2020-01-09 DIAGNOSIS — I10 Essential (primary) hypertension: Secondary | ICD-10-CM | POA: Diagnosis not present

## 2020-01-09 DIAGNOSIS — G40909 Epilepsy, unspecified, not intractable, without status epilepticus: Secondary | ICD-10-CM | POA: Diagnosis present

## 2020-01-09 DIAGNOSIS — S2231XA Fracture of one rib, right side, initial encounter for closed fracture: Secondary | ICD-10-CM | POA: Diagnosis not present

## 2020-01-09 DIAGNOSIS — J9601 Acute respiratory failure with hypoxia: Secondary | ICD-10-CM | POA: Diagnosis present

## 2020-01-09 DIAGNOSIS — D649 Anemia, unspecified: Secondary | ICD-10-CM | POA: Diagnosis not present

## 2020-01-09 DIAGNOSIS — R319 Hematuria, unspecified: Secondary | ICD-10-CM | POA: Diagnosis present

## 2020-01-09 DIAGNOSIS — M4854XA Collapsed vertebra, not elsewhere classified, thoracic region, initial encounter for fracture: Secondary | ICD-10-CM | POA: Diagnosis present

## 2020-01-09 DIAGNOSIS — K5900 Constipation, unspecified: Secondary | ICD-10-CM | POA: Diagnosis present

## 2020-01-09 DIAGNOSIS — R278 Other lack of coordination: Secondary | ICD-10-CM | POA: Diagnosis not present

## 2020-01-09 DIAGNOSIS — Z4789 Encounter for other orthopedic aftercare: Secondary | ICD-10-CM | POA: Diagnosis not present

## 2020-01-09 DIAGNOSIS — N170 Acute kidney failure with tubular necrosis: Secondary | ICD-10-CM | POA: Diagnosis not present

## 2020-01-09 DIAGNOSIS — R410 Disorientation, unspecified: Secondary | ICD-10-CM | POA: Diagnosis not present

## 2020-01-09 DIAGNOSIS — I350 Nonrheumatic aortic (valve) stenosis: Secondary | ICD-10-CM | POA: Diagnosis present

## 2020-01-09 DIAGNOSIS — N189 Chronic kidney disease, unspecified: Secondary | ICD-10-CM | POA: Diagnosis not present

## 2020-01-09 DIAGNOSIS — S72002A Fracture of unspecified part of neck of left femur, initial encounter for closed fracture: Secondary | ICD-10-CM | POA: Diagnosis not present

## 2020-01-09 DIAGNOSIS — I4891 Unspecified atrial fibrillation: Secondary | ICD-10-CM | POA: Diagnosis not present

## 2020-01-09 DIAGNOSIS — I517 Cardiomegaly: Secondary | ICD-10-CM | POA: Diagnosis not present

## 2020-01-09 DIAGNOSIS — R42 Dizziness and giddiness: Secondary | ICD-10-CM | POA: Diagnosis not present

## 2020-01-09 DIAGNOSIS — S72142A Displaced intertrochanteric fracture of left femur, initial encounter for closed fracture: Secondary | ICD-10-CM | POA: Diagnosis not present

## 2020-01-09 DIAGNOSIS — F419 Anxiety disorder, unspecified: Secondary | ICD-10-CM | POA: Diagnosis present

## 2020-01-09 DIAGNOSIS — R262 Difficulty in walking, not elsewhere classified: Secondary | ICD-10-CM | POA: Diagnosis not present

## 2020-01-09 DIAGNOSIS — A4150 Gram-negative sepsis, unspecified: Secondary | ICD-10-CM | POA: Diagnosis present

## 2020-01-09 DIAGNOSIS — R531 Weakness: Secondary | ICD-10-CM | POA: Diagnosis not present

## 2020-01-09 DIAGNOSIS — J811 Chronic pulmonary edema: Secondary | ICD-10-CM | POA: Diagnosis not present

## 2020-01-12 DIAGNOSIS — G8918 Other acute postprocedural pain: Secondary | ICD-10-CM | POA: Diagnosis not present

## 2020-01-12 DIAGNOSIS — R262 Difficulty in walking, not elsewhere classified: Secondary | ICD-10-CM | POA: Diagnosis not present

## 2020-01-12 DIAGNOSIS — S72142D Displaced intertrochanteric fracture of left femur, subsequent encounter for closed fracture with routine healing: Secondary | ICD-10-CM | POA: Diagnosis not present

## 2020-01-12 DIAGNOSIS — D649 Anemia, unspecified: Secondary | ICD-10-CM | POA: Diagnosis not present

## 2020-01-15 DIAGNOSIS — L89150 Pressure ulcer of sacral region, unstageable: Secondary | ICD-10-CM | POA: Diagnosis not present

## 2020-01-17 DIAGNOSIS — S72142D Displaced intertrochanteric fracture of left femur, subsequent encounter for closed fracture with routine healing: Secondary | ICD-10-CM | POA: Diagnosis not present

## 2020-01-17 DIAGNOSIS — S72002A Fracture of unspecified part of neck of left femur, initial encounter for closed fracture: Secondary | ICD-10-CM | POA: Diagnosis not present

## 2020-01-17 DIAGNOSIS — S72142A Displaced intertrochanteric fracture of left femur, initial encounter for closed fracture: Secondary | ICD-10-CM | POA: Diagnosis not present

## 2020-01-20 DIAGNOSIS — F419 Anxiety disorder, unspecified: Secondary | ICD-10-CM | POA: Diagnosis present

## 2020-01-20 DIAGNOSIS — J811 Chronic pulmonary edema: Secondary | ICD-10-CM | POA: Diagnosis not present

## 2020-01-20 DIAGNOSIS — S2231XA Fracture of one rib, right side, initial encounter for closed fracture: Secondary | ICD-10-CM | POA: Diagnosis not present

## 2020-01-20 DIAGNOSIS — R0902 Hypoxemia: Secondary | ICD-10-CM | POA: Diagnosis not present

## 2020-01-20 DIAGNOSIS — E669 Obesity, unspecified: Secondary | ICD-10-CM | POA: Diagnosis not present

## 2020-01-20 DIAGNOSIS — R188 Other ascites: Secondary | ICD-10-CM | POA: Diagnosis not present

## 2020-01-20 DIAGNOSIS — A4150 Gram-negative sepsis, unspecified: Secondary | ICD-10-CM | POA: Diagnosis present

## 2020-01-20 DIAGNOSIS — I517 Cardiomegaly: Secondary | ICD-10-CM | POA: Diagnosis not present

## 2020-01-20 DIAGNOSIS — G319 Degenerative disease of nervous system, unspecified: Secondary | ICD-10-CM | POA: Diagnosis not present

## 2020-01-20 DIAGNOSIS — R410 Disorientation, unspecified: Secondary | ICD-10-CM | POA: Diagnosis not present

## 2020-01-20 DIAGNOSIS — I350 Nonrheumatic aortic (valve) stenosis: Secondary | ICD-10-CM | POA: Diagnosis not present

## 2020-01-20 DIAGNOSIS — R2681 Unsteadiness on feet: Secondary | ICD-10-CM | POA: Diagnosis not present

## 2020-01-20 DIAGNOSIS — I639 Cerebral infarction, unspecified: Secondary | ICD-10-CM | POA: Diagnosis not present

## 2020-01-20 DIAGNOSIS — N390 Urinary tract infection, site not specified: Secondary | ICD-10-CM | POA: Diagnosis present

## 2020-01-20 DIAGNOSIS — I480 Paroxysmal atrial fibrillation: Secondary | ICD-10-CM | POA: Diagnosis present

## 2020-01-20 DIAGNOSIS — I2510 Atherosclerotic heart disease of native coronary artery without angina pectoris: Secondary | ICD-10-CM | POA: Diagnosis present

## 2020-01-20 DIAGNOSIS — I69951 Hemiplegia and hemiparesis following unspecified cerebrovascular disease affecting right dominant side: Secondary | ICD-10-CM | POA: Diagnosis not present

## 2020-01-20 DIAGNOSIS — R41841 Cognitive communication deficit: Secondary | ICD-10-CM | POA: Diagnosis not present

## 2020-01-20 DIAGNOSIS — M4856XA Collapsed vertebra, not elsewhere classified, lumbar region, initial encounter for fracture: Secondary | ICD-10-CM | POA: Diagnosis present

## 2020-01-20 DIAGNOSIS — R319 Hematuria, unspecified: Secondary | ICD-10-CM | POA: Diagnosis present

## 2020-01-20 DIAGNOSIS — J9601 Acute respiratory failure with hypoxia: Secondary | ICD-10-CM | POA: Diagnosis present

## 2020-01-20 DIAGNOSIS — G9341 Metabolic encephalopathy: Secondary | ICD-10-CM | POA: Diagnosis not present

## 2020-01-20 DIAGNOSIS — I110 Hypertensive heart disease with heart failure: Secondary | ICD-10-CM | POA: Diagnosis present

## 2020-01-20 DIAGNOSIS — I6782 Cerebral ischemia: Secondary | ICD-10-CM | POA: Diagnosis not present

## 2020-01-20 DIAGNOSIS — A4151 Sepsis due to Escherichia coli [E. coli]: Secondary | ICD-10-CM | POA: Diagnosis not present

## 2020-01-20 DIAGNOSIS — D500 Iron deficiency anemia secondary to blood loss (chronic): Secondary | ICD-10-CM | POA: Diagnosis not present

## 2020-01-20 DIAGNOSIS — R0602 Shortness of breath: Secondary | ICD-10-CM | POA: Diagnosis not present

## 2020-01-20 DIAGNOSIS — R29810 Facial weakness: Secondary | ICD-10-CM | POA: Diagnosis not present

## 2020-01-20 DIAGNOSIS — M4854XA Collapsed vertebra, not elsewhere classified, thoracic region, initial encounter for fracture: Secondary | ICD-10-CM | POA: Diagnosis present

## 2020-01-20 DIAGNOSIS — R109 Unspecified abdominal pain: Secondary | ICD-10-CM | POA: Diagnosis not present

## 2020-01-20 DIAGNOSIS — E785 Hyperlipidemia, unspecified: Secondary | ICD-10-CM | POA: Diagnosis present

## 2020-01-20 DIAGNOSIS — M810 Age-related osteoporosis without current pathological fracture: Secondary | ICD-10-CM | POA: Diagnosis not present

## 2020-01-20 DIAGNOSIS — I10 Essential (primary) hypertension: Secondary | ICD-10-CM | POA: Diagnosis not present

## 2020-01-20 DIAGNOSIS — Z23 Encounter for immunization: Secondary | ICD-10-CM | POA: Diagnosis not present

## 2020-01-20 DIAGNOSIS — I4891 Unspecified atrial fibrillation: Secondary | ICD-10-CM | POA: Diagnosis not present

## 2020-01-20 DIAGNOSIS — K838 Other specified diseases of biliary tract: Secondary | ICD-10-CM | POA: Diagnosis present

## 2020-01-20 DIAGNOSIS — W19XXXD Unspecified fall, subsequent encounter: Secondary | ICD-10-CM | POA: Diagnosis not present

## 2020-01-20 DIAGNOSIS — F32A Depression, unspecified: Secondary | ICD-10-CM | POA: Diagnosis present

## 2020-01-20 DIAGNOSIS — D689 Coagulation defect, unspecified: Secondary | ICD-10-CM | POA: Diagnosis present

## 2020-01-20 DIAGNOSIS — K5900 Constipation, unspecified: Secondary | ICD-10-CM | POA: Diagnosis present

## 2020-01-20 DIAGNOSIS — M25552 Pain in left hip: Secondary | ICD-10-CM | POA: Diagnosis not present

## 2020-01-20 DIAGNOSIS — L89156 Pressure-induced deep tissue damage of sacral region: Secondary | ICD-10-CM | POA: Diagnosis not present

## 2020-01-20 DIAGNOSIS — R42 Dizziness and giddiness: Secondary | ICD-10-CM | POA: Diagnosis not present

## 2020-01-20 DIAGNOSIS — K219 Gastro-esophageal reflux disease without esophagitis: Secondary | ICD-10-CM | POA: Diagnosis present

## 2020-01-20 DIAGNOSIS — R404 Transient alteration of awareness: Secondary | ICD-10-CM | POA: Diagnosis not present

## 2020-01-20 DIAGNOSIS — N189 Chronic kidney disease, unspecified: Secondary | ICD-10-CM | POA: Diagnosis not present

## 2020-01-20 DIAGNOSIS — I5033 Acute on chronic diastolic (congestive) heart failure: Secondary | ICD-10-CM | POA: Diagnosis present

## 2020-01-20 DIAGNOSIS — Z4789 Encounter for other orthopedic aftercare: Secondary | ICD-10-CM | POA: Diagnosis not present

## 2020-01-20 DIAGNOSIS — I700 Atherosclerosis of aorta: Secondary | ICD-10-CM | POA: Diagnosis not present

## 2020-01-20 DIAGNOSIS — S72142D Displaced intertrochanteric fracture of left femur, subsequent encounter for closed fracture with routine healing: Secondary | ICD-10-CM | POA: Diagnosis not present

## 2020-01-20 DIAGNOSIS — B0229 Other postherpetic nervous system involvement: Secondary | ICD-10-CM | POA: Diagnosis not present

## 2020-01-20 DIAGNOSIS — R531 Weakness: Secondary | ICD-10-CM | POA: Diagnosis not present

## 2020-01-20 DIAGNOSIS — I959 Hypotension, unspecified: Secondary | ICD-10-CM | POA: Diagnosis not present

## 2020-01-20 DIAGNOSIS — G40909 Epilepsy, unspecified, not intractable, without status epilepticus: Secondary | ICD-10-CM | POA: Diagnosis present

## 2020-01-20 DIAGNOSIS — L8994 Pressure ulcer of unspecified site, stage 4: Secondary | ICD-10-CM | POA: Diagnosis not present

## 2020-01-20 DIAGNOSIS — N281 Cyst of kidney, acquired: Secondary | ICD-10-CM | POA: Diagnosis not present

## 2020-01-20 DIAGNOSIS — L89154 Pressure ulcer of sacral region, stage 4: Secondary | ICD-10-CM | POA: Diagnosis present

## 2020-01-20 DIAGNOSIS — R6521 Severe sepsis with septic shock: Secondary | ICD-10-CM | POA: Diagnosis present

## 2020-01-20 DIAGNOSIS — B961 Klebsiella pneumoniae [K. pneumoniae] as the cause of diseases classified elsewhere: Secondary | ICD-10-CM | POA: Diagnosis not present

## 2020-01-20 DIAGNOSIS — F0390 Unspecified dementia without behavioral disturbance: Secondary | ICD-10-CM | POA: Diagnosis present

## 2020-01-20 DIAGNOSIS — E7800 Pure hypercholesterolemia, unspecified: Secondary | ICD-10-CM | POA: Diagnosis present

## 2020-01-20 DIAGNOSIS — M6281 Muscle weakness (generalized): Secondary | ICD-10-CM | POA: Diagnosis not present

## 2020-01-20 DIAGNOSIS — R278 Other lack of coordination: Secondary | ICD-10-CM | POA: Diagnosis not present

## 2020-01-20 DIAGNOSIS — Z8673 Personal history of transient ischemic attack (TIA), and cerebral infarction without residual deficits: Secondary | ICD-10-CM | POA: Diagnosis not present

## 2020-01-20 DIAGNOSIS — A419 Sepsis, unspecified organism: Secondary | ICD-10-CM | POA: Diagnosis not present

## 2020-01-21 DIAGNOSIS — A4150 Gram-negative sepsis, unspecified: Secondary | ICD-10-CM | POA: Diagnosis not present

## 2020-01-21 DIAGNOSIS — L89154 Pressure ulcer of sacral region, stage 4: Secondary | ICD-10-CM | POA: Diagnosis not present

## 2020-01-21 DIAGNOSIS — R6521 Severe sepsis with septic shock: Secondary | ICD-10-CM | POA: Diagnosis not present

## 2020-01-22 DIAGNOSIS — N281 Cyst of kidney, acquired: Secondary | ICD-10-CM | POA: Diagnosis not present

## 2020-01-22 DIAGNOSIS — I517 Cardiomegaly: Secondary | ICD-10-CM | POA: Diagnosis not present

## 2020-01-22 DIAGNOSIS — R188 Other ascites: Secondary | ICD-10-CM | POA: Diagnosis not present

## 2020-01-22 DIAGNOSIS — R0902 Hypoxemia: Secondary | ICD-10-CM | POA: Diagnosis not present

## 2020-01-22 DIAGNOSIS — L89154 Pressure ulcer of sacral region, stage 4: Secondary | ICD-10-CM | POA: Diagnosis not present

## 2020-01-22 DIAGNOSIS — I700 Atherosclerosis of aorta: Secondary | ICD-10-CM | POA: Diagnosis not present

## 2020-01-22 DIAGNOSIS — A4150 Gram-negative sepsis, unspecified: Secondary | ICD-10-CM | POA: Diagnosis not present

## 2020-01-22 DIAGNOSIS — J811 Chronic pulmonary edema: Secondary | ICD-10-CM | POA: Diagnosis not present

## 2020-01-22 DIAGNOSIS — K838 Other specified diseases of biliary tract: Secondary | ICD-10-CM | POA: Diagnosis not present

## 2020-01-22 DIAGNOSIS — R6521 Severe sepsis with septic shock: Secondary | ICD-10-CM | POA: Diagnosis not present

## 2020-01-23 DIAGNOSIS — R6521 Severe sepsis with septic shock: Secondary | ICD-10-CM | POA: Diagnosis not present

## 2020-01-23 DIAGNOSIS — A4150 Gram-negative sepsis, unspecified: Secondary | ICD-10-CM | POA: Diagnosis not present

## 2020-01-23 DIAGNOSIS — L89154 Pressure ulcer of sacral region, stage 4: Secondary | ICD-10-CM | POA: Diagnosis not present

## 2020-01-24 DIAGNOSIS — A4150 Gram-negative sepsis, unspecified: Secondary | ICD-10-CM | POA: Diagnosis not present

## 2020-01-24 DIAGNOSIS — R6521 Severe sepsis with septic shock: Secondary | ICD-10-CM | POA: Diagnosis not present

## 2020-01-24 DIAGNOSIS — L89154 Pressure ulcer of sacral region, stage 4: Secondary | ICD-10-CM | POA: Diagnosis not present

## 2020-01-25 DIAGNOSIS — R6521 Severe sepsis with septic shock: Secondary | ICD-10-CM | POA: Diagnosis not present

## 2020-01-25 DIAGNOSIS — L89154 Pressure ulcer of sacral region, stage 4: Secondary | ICD-10-CM | POA: Diagnosis not present

## 2020-01-25 DIAGNOSIS — R410 Disorientation, unspecified: Secondary | ICD-10-CM | POA: Diagnosis not present

## 2020-01-25 DIAGNOSIS — I6782 Cerebral ischemia: Secondary | ICD-10-CM | POA: Diagnosis not present

## 2020-01-25 DIAGNOSIS — A4150 Gram-negative sepsis, unspecified: Secondary | ICD-10-CM | POA: Diagnosis not present

## 2020-01-25 DIAGNOSIS — E669 Obesity, unspecified: Secondary | ICD-10-CM | POA: Diagnosis not present

## 2020-01-25 DIAGNOSIS — I639 Cerebral infarction, unspecified: Secondary | ICD-10-CM | POA: Diagnosis not present

## 2020-01-25 DIAGNOSIS — L8994 Pressure ulcer of unspecified site, stage 4: Secondary | ICD-10-CM | POA: Diagnosis not present

## 2020-01-25 DIAGNOSIS — I10 Essential (primary) hypertension: Secondary | ICD-10-CM | POA: Diagnosis not present

## 2020-01-25 DIAGNOSIS — G319 Degenerative disease of nervous system, unspecified: Secondary | ICD-10-CM | POA: Diagnosis not present

## 2020-01-26 DIAGNOSIS — A4150 Gram-negative sepsis, unspecified: Secondary | ICD-10-CM | POA: Diagnosis not present

## 2020-01-26 DIAGNOSIS — L89154 Pressure ulcer of sacral region, stage 4: Secondary | ICD-10-CM | POA: Diagnosis not present

## 2020-01-26 DIAGNOSIS — R6521 Severe sepsis with septic shock: Secondary | ICD-10-CM | POA: Diagnosis not present

## 2020-01-27 DIAGNOSIS — A4150 Gram-negative sepsis, unspecified: Secondary | ICD-10-CM | POA: Diagnosis not present

## 2020-01-27 DIAGNOSIS — R6521 Severe sepsis with septic shock: Secondary | ICD-10-CM | POA: Diagnosis not present

## 2020-01-27 DIAGNOSIS — L89154 Pressure ulcer of sacral region, stage 4: Secondary | ICD-10-CM | POA: Diagnosis not present

## 2020-01-28 DIAGNOSIS — A4150 Gram-negative sepsis, unspecified: Secondary | ICD-10-CM | POA: Diagnosis not present

## 2020-01-28 DIAGNOSIS — R6521 Severe sepsis with septic shock: Secondary | ICD-10-CM | POA: Diagnosis not present

## 2020-01-28 DIAGNOSIS — L89154 Pressure ulcer of sacral region, stage 4: Secondary | ICD-10-CM | POA: Diagnosis not present

## 2020-01-29 DIAGNOSIS — I96 Gangrene, not elsewhere classified: Secondary | ICD-10-CM | POA: Diagnosis not present

## 2020-01-29 DIAGNOSIS — Z87891 Personal history of nicotine dependence: Secondary | ICD-10-CM | POA: Diagnosis not present

## 2020-01-29 DIAGNOSIS — I350 Nonrheumatic aortic (valve) stenosis: Secondary | ICD-10-CM | POA: Diagnosis not present

## 2020-01-29 DIAGNOSIS — L89159 Pressure ulcer of sacral region, unspecified stage: Secondary | ICD-10-CM | POA: Diagnosis not present

## 2020-01-29 DIAGNOSIS — A4151 Sepsis due to Escherichia coli [E. coli]: Secondary | ICD-10-CM | POA: Diagnosis not present

## 2020-01-29 DIAGNOSIS — G4700 Insomnia, unspecified: Secondary | ICD-10-CM | POA: Diagnosis not present

## 2020-01-29 DIAGNOSIS — G40909 Epilepsy, unspecified, not intractable, without status epilepticus: Secondary | ICD-10-CM | POA: Diagnosis not present

## 2020-01-29 DIAGNOSIS — I110 Hypertensive heart disease with heart failure: Secondary | ICD-10-CM | POA: Diagnosis not present

## 2020-01-29 DIAGNOSIS — D500 Iron deficiency anemia secondary to blood loss (chronic): Secondary | ICD-10-CM | POA: Diagnosis not present

## 2020-01-29 DIAGNOSIS — I2510 Atherosclerotic heart disease of native coronary artery without angina pectoris: Secondary | ICD-10-CM | POA: Diagnosis not present

## 2020-01-29 DIAGNOSIS — R2681 Unsteadiness on feet: Secondary | ICD-10-CM | POA: Diagnosis not present

## 2020-01-29 DIAGNOSIS — K838 Other specified diseases of biliary tract: Secondary | ICD-10-CM | POA: Diagnosis not present

## 2020-01-29 DIAGNOSIS — R278 Other lack of coordination: Secondary | ICD-10-CM | POA: Diagnosis not present

## 2020-01-29 DIAGNOSIS — R319 Hematuria, unspecified: Secondary | ICD-10-CM | POA: Diagnosis not present

## 2020-01-29 DIAGNOSIS — F32A Depression, unspecified: Secondary | ICD-10-CM | POA: Diagnosis not present

## 2020-01-29 DIAGNOSIS — M4856XA Collapsed vertebra, not elsewhere classified, lumbar region, initial encounter for fracture: Secondary | ICD-10-CM | POA: Diagnosis not present

## 2020-01-29 DIAGNOSIS — Z7401 Bed confinement status: Secondary | ICD-10-CM | POA: Diagnosis not present

## 2020-01-29 DIAGNOSIS — R262 Difficulty in walking, not elsewhere classified: Secondary | ICD-10-CM | POA: Diagnosis not present

## 2020-01-29 DIAGNOSIS — J9601 Acute respiratory failure with hypoxia: Secondary | ICD-10-CM | POA: Diagnosis not present

## 2020-01-29 DIAGNOSIS — Z4789 Encounter for other orthopedic aftercare: Secondary | ICD-10-CM | POA: Diagnosis not present

## 2020-01-29 DIAGNOSIS — N390 Urinary tract infection, site not specified: Secondary | ICD-10-CM | POA: Diagnosis not present

## 2020-01-29 DIAGNOSIS — F419 Anxiety disorder, unspecified: Secondary | ICD-10-CM | POA: Diagnosis not present

## 2020-01-29 DIAGNOSIS — I708 Atherosclerosis of other arteries: Secondary | ICD-10-CM | POA: Diagnosis not present

## 2020-01-29 DIAGNOSIS — M6281 Muscle weakness (generalized): Secondary | ICD-10-CM | POA: Diagnosis not present

## 2020-01-29 DIAGNOSIS — K219 Gastro-esophageal reflux disease without esophagitis: Secondary | ICD-10-CM | POA: Diagnosis present

## 2020-01-29 DIAGNOSIS — S3219XA Other fracture of sacrum, initial encounter for closed fracture: Secondary | ICD-10-CM | POA: Diagnosis not present

## 2020-01-29 DIAGNOSIS — I4891 Unspecified atrial fibrillation: Secondary | ICD-10-CM | POA: Diagnosis not present

## 2020-01-29 DIAGNOSIS — L89150 Pressure ulcer of sacral region, unstageable: Secondary | ICD-10-CM | POA: Diagnosis not present

## 2020-01-29 DIAGNOSIS — F22 Delusional disorders: Secondary | ICD-10-CM | POA: Diagnosis not present

## 2020-01-29 DIAGNOSIS — M1990 Unspecified osteoarthritis, unspecified site: Secondary | ICD-10-CM | POA: Diagnosis present

## 2020-01-29 DIAGNOSIS — R0902 Hypoxemia: Secondary | ICD-10-CM | POA: Diagnosis not present

## 2020-01-29 DIAGNOSIS — G9341 Metabolic encephalopathy: Secondary | ICD-10-CM | POA: Diagnosis not present

## 2020-01-29 DIAGNOSIS — R6521 Severe sepsis with septic shock: Secondary | ICD-10-CM | POA: Diagnosis not present

## 2020-01-29 DIAGNOSIS — Z951 Presence of aortocoronary bypass graft: Secondary | ICD-10-CM | POA: Diagnosis not present

## 2020-01-29 DIAGNOSIS — R41841 Cognitive communication deficit: Secondary | ICD-10-CM | POA: Diagnosis not present

## 2020-01-29 DIAGNOSIS — B0229 Other postherpetic nervous system involvement: Secondary | ICD-10-CM | POA: Diagnosis not present

## 2020-01-29 DIAGNOSIS — S72142D Displaced intertrochanteric fracture of left femur, subsequent encounter for closed fracture with routine healing: Secondary | ICD-10-CM | POA: Diagnosis not present

## 2020-01-29 DIAGNOSIS — D689 Coagulation defect, unspecified: Secondary | ICD-10-CM | POA: Diagnosis not present

## 2020-01-29 DIAGNOSIS — N189 Chronic kidney disease, unspecified: Secondary | ICD-10-CM | POA: Diagnosis not present

## 2020-01-29 DIAGNOSIS — F0390 Unspecified dementia without behavioral disturbance: Secondary | ICD-10-CM | POA: Diagnosis not present

## 2020-01-29 DIAGNOSIS — B961 Klebsiella pneumoniae [K. pneumoniae] as the cause of diseases classified elsewhere: Secondary | ICD-10-CM | POA: Diagnosis not present

## 2020-01-29 DIAGNOSIS — I10 Essential (primary) hypertension: Secondary | ICD-10-CM | POA: Diagnosis not present

## 2020-01-29 DIAGNOSIS — M25552 Pain in left hip: Secondary | ICD-10-CM | POA: Diagnosis not present

## 2020-01-29 DIAGNOSIS — I480 Paroxysmal atrial fibrillation: Secondary | ICD-10-CM | POA: Diagnosis not present

## 2020-01-29 DIAGNOSIS — E441 Mild protein-calorie malnutrition: Secondary | ICD-10-CM | POA: Diagnosis not present

## 2020-01-29 DIAGNOSIS — Z8739 Personal history of other diseases of the musculoskeletal system and connective tissue: Secondary | ICD-10-CM | POA: Diagnosis not present

## 2020-01-29 DIAGNOSIS — Z7901 Long term (current) use of anticoagulants: Secondary | ICD-10-CM | POA: Diagnosis not present

## 2020-01-29 DIAGNOSIS — M810 Age-related osteoporosis without current pathological fracture: Secondary | ICD-10-CM | POA: Diagnosis not present

## 2020-01-29 DIAGNOSIS — I252 Old myocardial infarction: Secondary | ICD-10-CM | POA: Diagnosis not present

## 2020-01-29 DIAGNOSIS — Z79891 Long term (current) use of opiate analgesic: Secondary | ICD-10-CM | POA: Diagnosis not present

## 2020-01-29 DIAGNOSIS — E7800 Pure hypercholesterolemia, unspecified: Secondary | ICD-10-CM | POA: Diagnosis not present

## 2020-01-29 DIAGNOSIS — I69951 Hemiplegia and hemiparesis following unspecified cerebrovascular disease affecting right dominant side: Secondary | ICD-10-CM | POA: Diagnosis not present

## 2020-01-29 DIAGNOSIS — M4854XA Collapsed vertebra, not elsewhere classified, thoracic region, initial encounter for fracture: Secondary | ICD-10-CM | POA: Diagnosis not present

## 2020-01-29 DIAGNOSIS — W19XXXD Unspecified fall, subsequent encounter: Secondary | ICD-10-CM | POA: Diagnosis not present

## 2020-01-29 DIAGNOSIS — A4150 Gram-negative sepsis, unspecified: Secondary | ICD-10-CM | POA: Diagnosis not present

## 2020-01-29 DIAGNOSIS — E785 Hyperlipidemia, unspecified: Secondary | ICD-10-CM | POA: Diagnosis present

## 2020-01-29 DIAGNOSIS — K5900 Constipation, unspecified: Secondary | ICD-10-CM | POA: Diagnosis not present

## 2020-01-29 DIAGNOSIS — Z8673 Personal history of transient ischemic attack (TIA), and cerebral infarction without residual deficits: Secondary | ICD-10-CM | POA: Diagnosis not present

## 2020-01-29 DIAGNOSIS — F413 Other mixed anxiety disorders: Secondary | ICD-10-CM | POA: Diagnosis not present

## 2020-01-29 DIAGNOSIS — Z79899 Other long term (current) drug therapy: Secondary | ICD-10-CM | POA: Diagnosis not present

## 2020-01-29 DIAGNOSIS — I5033 Acute on chronic diastolic (congestive) heart failure: Secondary | ICD-10-CM | POA: Diagnosis not present

## 2020-01-29 DIAGNOSIS — Z23 Encounter for immunization: Secondary | ICD-10-CM | POA: Diagnosis not present

## 2020-01-29 DIAGNOSIS — L89154 Pressure ulcer of sacral region, stage 4: Secondary | ICD-10-CM | POA: Diagnosis not present

## 2020-01-29 DIAGNOSIS — R443 Hallucinations, unspecified: Secondary | ICD-10-CM | POA: Diagnosis not present

## 2020-01-30 DIAGNOSIS — L89154 Pressure ulcer of sacral region, stage 4: Secondary | ICD-10-CM | POA: Diagnosis not present

## 2020-01-30 DIAGNOSIS — R262 Difficulty in walking, not elsewhere classified: Secondary | ICD-10-CM | POA: Diagnosis not present

## 2020-01-30 DIAGNOSIS — I4891 Unspecified atrial fibrillation: Secondary | ICD-10-CM | POA: Diagnosis not present

## 2020-01-30 DIAGNOSIS — E441 Mild protein-calorie malnutrition: Secondary | ICD-10-CM | POA: Diagnosis not present

## 2020-01-31 DIAGNOSIS — R443 Hallucinations, unspecified: Secondary | ICD-10-CM | POA: Diagnosis not present

## 2020-01-31 DIAGNOSIS — F413 Other mixed anxiety disorders: Secondary | ICD-10-CM | POA: Diagnosis not present

## 2020-01-31 DIAGNOSIS — G4700 Insomnia, unspecified: Secondary | ICD-10-CM | POA: Diagnosis not present

## 2020-01-31 DIAGNOSIS — F32A Depression, unspecified: Secondary | ICD-10-CM | POA: Diagnosis not present

## 2020-01-31 DIAGNOSIS — F22 Delusional disorders: Secondary | ICD-10-CM | POA: Diagnosis not present

## 2020-02-06 ENCOUNTER — Other Ambulatory Visit: Payer: Self-pay | Admitting: Cardiology

## 2020-02-06 ENCOUNTER — Other Ambulatory Visit: Payer: Self-pay | Admitting: *Deleted

## 2020-02-06 NOTE — Patient Outreach (Signed)
Member screened for potential [facility] Care Management needs as a benefit of [employer].  Per Patient Ping member resides in [facility].   Communication sent to [facility] SW to collaborate about anticipated dc plans and potential [facility] Care Management needs.  Will continue to follow while member resides in SNF.    Denaja Verhoeven, MSN-Ed, RN,BSN [facility] Post Acute Care Coordinator [phone] ( Business Mobile) [phone]  (Toll free office)  

## 2020-02-08 DIAGNOSIS — Z7901 Long term (current) use of anticoagulants: Secondary | ICD-10-CM | POA: Diagnosis not present

## 2020-02-08 DIAGNOSIS — R0902 Hypoxemia: Secondary | ICD-10-CM | POA: Diagnosis not present

## 2020-02-08 DIAGNOSIS — Z951 Presence of aortocoronary bypass graft: Secondary | ICD-10-CM | POA: Diagnosis not present

## 2020-02-08 DIAGNOSIS — Z79899 Other long term (current) drug therapy: Secondary | ICD-10-CM | POA: Diagnosis not present

## 2020-02-08 DIAGNOSIS — M1990 Unspecified osteoarthritis, unspecified site: Secondary | ICD-10-CM | POA: Diagnosis present

## 2020-02-08 DIAGNOSIS — I2510 Atherosclerotic heart disease of native coronary artery without angina pectoris: Secondary | ICD-10-CM | POA: Diagnosis present

## 2020-02-08 DIAGNOSIS — Z7401 Bed confinement status: Secondary | ICD-10-CM | POA: Diagnosis not present

## 2020-02-08 DIAGNOSIS — F0390 Unspecified dementia without behavioral disturbance: Secondary | ICD-10-CM | POA: Diagnosis not present

## 2020-02-08 DIAGNOSIS — Z8739 Personal history of other diseases of the musculoskeletal system and connective tissue: Secondary | ICD-10-CM | POA: Diagnosis not present

## 2020-02-08 DIAGNOSIS — Z79891 Long term (current) use of opiate analgesic: Secondary | ICD-10-CM | POA: Diagnosis not present

## 2020-02-08 DIAGNOSIS — L89159 Pressure ulcer of sacral region, unspecified stage: Secondary | ICD-10-CM | POA: Diagnosis not present

## 2020-02-08 DIAGNOSIS — I252 Old myocardial infarction: Secondary | ICD-10-CM | POA: Diagnosis not present

## 2020-02-08 DIAGNOSIS — S3219XA Other fracture of sacrum, initial encounter for closed fracture: Secondary | ICD-10-CM | POA: Diagnosis not present

## 2020-02-08 DIAGNOSIS — L89154 Pressure ulcer of sacral region, stage 4: Secondary | ICD-10-CM | POA: Diagnosis not present

## 2020-02-08 DIAGNOSIS — E785 Hyperlipidemia, unspecified: Secondary | ICD-10-CM | POA: Diagnosis not present

## 2020-02-08 DIAGNOSIS — Z87891 Personal history of nicotine dependence: Secondary | ICD-10-CM | POA: Diagnosis not present

## 2020-02-08 DIAGNOSIS — Z8673 Personal history of transient ischemic attack (TIA), and cerebral infarction without residual deficits: Secondary | ICD-10-CM | POA: Diagnosis not present

## 2020-02-08 DIAGNOSIS — K219 Gastro-esophageal reflux disease without esophagitis: Secondary | ICD-10-CM | POA: Diagnosis present

## 2020-02-08 DIAGNOSIS — I10 Essential (primary) hypertension: Secondary | ICD-10-CM | POA: Diagnosis present

## 2020-02-08 DIAGNOSIS — I96 Gangrene, not elsewhere classified: Secondary | ICD-10-CM | POA: Diagnosis present

## 2020-02-08 DIAGNOSIS — I708 Atherosclerosis of other arteries: Secondary | ICD-10-CM | POA: Diagnosis not present

## 2020-02-12 ENCOUNTER — Inpatient Hospital Stay
Admission: RE | Admit: 2020-02-12 | Discharge: 2020-03-19 | Disposition: A | Discharge status: Skilled Nursing Facility | Payer: Medicare Other | Attending: Internal Medicine | Admitting: Internal Medicine

## 2020-02-12 DIAGNOSIS — J811 Chronic pulmonary edema: Secondary | ICD-10-CM | POA: Diagnosis not present

## 2020-02-12 DIAGNOSIS — R41841 Cognitive communication deficit: Secondary | ICD-10-CM | POA: Diagnosis not present

## 2020-02-12 DIAGNOSIS — I499 Cardiac arrhythmia, unspecified: Secondary | ICD-10-CM | POA: Diagnosis not present

## 2020-02-12 DIAGNOSIS — S72002S Fracture of unspecified part of neck of left femur, sequela: Secondary | ICD-10-CM | POA: Diagnosis not present

## 2020-02-12 DIAGNOSIS — K7689 Other specified diseases of liver: Secondary | ICD-10-CM | POA: Diagnosis not present

## 2020-02-12 DIAGNOSIS — L89154 Pressure ulcer of sacral region, stage 4: Secondary | ICD-10-CM | POA: Diagnosis present

## 2020-02-12 DIAGNOSIS — G890 Central pain syndrome: Secondary | ICD-10-CM | POA: Diagnosis not present

## 2020-02-12 DIAGNOSIS — R5381 Other malaise: Secondary | ICD-10-CM | POA: Diagnosis present

## 2020-02-12 DIAGNOSIS — I2510 Atherosclerotic heart disease of native coronary artery without angina pectoris: Secondary | ICD-10-CM | POA: Diagnosis present

## 2020-02-12 DIAGNOSIS — F339 Major depressive disorder, recurrent, unspecified: Secondary | ICD-10-CM | POA: Diagnosis not present

## 2020-02-12 DIAGNOSIS — Z4682 Encounter for fitting and adjustment of non-vascular catheter: Secondary | ICD-10-CM | POA: Diagnosis not present

## 2020-02-12 DIAGNOSIS — Z7401 Bed confinement status: Secondary | ICD-10-CM | POA: Diagnosis not present

## 2020-02-12 DIAGNOSIS — B961 Klebsiella pneumoniae [K. pneumoniae] as the cause of diseases classified elsewhere: Secondary | ICD-10-CM | POA: Diagnosis not present

## 2020-02-12 DIAGNOSIS — Z452 Encounter for adjustment and management of vascular access device: Secondary | ICD-10-CM | POA: Diagnosis not present

## 2020-02-12 DIAGNOSIS — N390 Urinary tract infection, site not specified: Secondary | ICD-10-CM | POA: Diagnosis not present

## 2020-02-12 DIAGNOSIS — F0390 Unspecified dementia without behavioral disturbance: Secondary | ICD-10-CM | POA: Diagnosis present

## 2020-02-12 DIAGNOSIS — G9349 Other encephalopathy: Secondary | ICD-10-CM | POA: Diagnosis present

## 2020-02-12 DIAGNOSIS — J189 Pneumonia, unspecified organism: Secondary | ICD-10-CM | POA: Diagnosis not present

## 2020-02-12 DIAGNOSIS — T85598A Other mechanical complication of other gastrointestinal prosthetic devices, implants and grafts, initial encounter: Secondary | ICD-10-CM

## 2020-02-12 DIAGNOSIS — E11622 Type 2 diabetes mellitus with other skin ulcer: Secondary | ICD-10-CM | POA: Diagnosis not present

## 2020-02-12 DIAGNOSIS — I517 Cardiomegaly: Secondary | ICD-10-CM | POA: Diagnosis not present

## 2020-02-12 DIAGNOSIS — I69351 Hemiplegia and hemiparesis following cerebral infarction affecting right dominant side: Secondary | ICD-10-CM | POA: Diagnosis not present

## 2020-02-12 DIAGNOSIS — I69998 Other sequelae following unspecified cerebrovascular disease: Secondary | ICD-10-CM | POA: Diagnosis not present

## 2020-02-12 DIAGNOSIS — I25810 Atherosclerosis of coronary artery bypass graft(s) without angina pectoris: Secondary | ICD-10-CM | POA: Diagnosis not present

## 2020-02-12 DIAGNOSIS — J90 Pleural effusion, not elsewhere classified: Secondary | ICD-10-CM | POA: Diagnosis not present

## 2020-02-12 DIAGNOSIS — E871 Hypo-osmolality and hyponatremia: Secondary | ICD-10-CM | POA: Diagnosis not present

## 2020-02-12 DIAGNOSIS — M6259 Muscle wasting and atrophy, not elsewhere classified, multiple sites: Secondary | ICD-10-CM | POA: Diagnosis not present

## 2020-02-12 DIAGNOSIS — T17908A Unspecified foreign body in respiratory tract, part unspecified causing other injury, initial encounter: Secondary | ICD-10-CM

## 2020-02-12 DIAGNOSIS — I4891 Unspecified atrial fibrillation: Secondary | ICD-10-CM | POA: Diagnosis present

## 2020-02-12 DIAGNOSIS — R140 Abdominal distension (gaseous): Secondary | ICD-10-CM | POA: Diagnosis not present

## 2020-02-12 DIAGNOSIS — I69951 Hemiplegia and hemiparesis following unspecified cerebrovascular disease affecting right dominant side: Secondary | ICD-10-CM | POA: Diagnosis not present

## 2020-02-12 DIAGNOSIS — I10 Essential (primary) hypertension: Secondary | ICD-10-CM | POA: Diagnosis present

## 2020-02-12 DIAGNOSIS — I509 Heart failure, unspecified: Secondary | ICD-10-CM | POA: Diagnosis not present

## 2020-02-12 DIAGNOSIS — F05 Delirium due to known physiological condition: Secondary | ICD-10-CM | POA: Diagnosis present

## 2020-02-12 DIAGNOSIS — I96 Gangrene, not elsewhere classified: Secondary | ICD-10-CM | POA: Diagnosis present

## 2020-02-12 DIAGNOSIS — G40909 Epilepsy, unspecified, not intractable, without status epilepticus: Secondary | ICD-10-CM | POA: Diagnosis not present

## 2020-02-12 DIAGNOSIS — G8929 Other chronic pain: Secondary | ICD-10-CM | POA: Diagnosis not present

## 2020-02-12 DIAGNOSIS — Z9049 Acquired absence of other specified parts of digestive tract: Secondary | ICD-10-CM | POA: Diagnosis not present

## 2020-02-12 DIAGNOSIS — Z0189 Encounter for other specified special examinations: Secondary | ICD-10-CM

## 2020-02-12 DIAGNOSIS — E46 Unspecified protein-calorie malnutrition: Secondary | ICD-10-CM | POA: Diagnosis not present

## 2020-02-12 DIAGNOSIS — R1310 Dysphagia, unspecified: Secondary | ICD-10-CM

## 2020-02-12 DIAGNOSIS — E43 Unspecified severe protein-calorie malnutrition: Secondary | ICD-10-CM | POA: Diagnosis present

## 2020-02-12 DIAGNOSIS — L89150 Pressure ulcer of sacral region, unstageable: Secondary | ICD-10-CM | POA: Diagnosis not present

## 2020-02-12 DIAGNOSIS — K219 Gastro-esophageal reflux disease without esophagitis: Secondary | ICD-10-CM | POA: Diagnosis present

## 2020-02-12 DIAGNOSIS — Z951 Presence of aortocoronary bypass graft: Secondary | ICD-10-CM | POA: Diagnosis not present

## 2020-02-12 DIAGNOSIS — E785 Hyperlipidemia, unspecified: Secondary | ICD-10-CM | POA: Diagnosis present

## 2020-02-12 DIAGNOSIS — M2550 Pain in unspecified joint: Secondary | ICD-10-CM | POA: Diagnosis not present

## 2020-02-12 DIAGNOSIS — Z7901 Long term (current) use of anticoagulants: Secondary | ICD-10-CM | POA: Diagnosis not present

## 2020-02-12 DIAGNOSIS — L89159 Pressure ulcer of sacral region, unspecified stage: Secondary | ICD-10-CM | POA: Diagnosis not present

## 2020-02-12 DIAGNOSIS — M6281 Muscle weakness (generalized): Secondary | ICD-10-CM | POA: Diagnosis not present

## 2020-02-12 DIAGNOSIS — I959 Hypotension, unspecified: Secondary | ICD-10-CM | POA: Diagnosis not present

## 2020-02-12 DIAGNOSIS — Z87891 Personal history of nicotine dependence: Secondary | ICD-10-CM | POA: Diagnosis not present

## 2020-02-12 DIAGNOSIS — G894 Chronic pain syndrome: Secondary | ICD-10-CM | POA: Diagnosis present

## 2020-02-12 DIAGNOSIS — M1990 Unspecified osteoarthritis, unspecified site: Secondary | ICD-10-CM | POA: Diagnosis present

## 2020-02-12 DIAGNOSIS — R569 Unspecified convulsions: Secondary | ICD-10-CM | POA: Diagnosis not present

## 2020-02-12 DIAGNOSIS — R410 Disorientation, unspecified: Secondary | ICD-10-CM | POA: Diagnosis present

## 2020-02-12 DIAGNOSIS — R188 Other ascites: Secondary | ICD-10-CM | POA: Diagnosis present

## 2020-02-12 DIAGNOSIS — I25799 Atherosclerosis of other coronary artery bypass graft(s) with unspecified angina pectoris: Secondary | ICD-10-CM | POA: Diagnosis not present

## 2020-02-12 DIAGNOSIS — G9340 Encephalopathy, unspecified: Secondary | ICD-10-CM | POA: Diagnosis not present

## 2020-02-12 DIAGNOSIS — K8689 Other specified diseases of pancreas: Secondary | ICD-10-CM | POA: Diagnosis not present

## 2020-02-12 DIAGNOSIS — R531 Weakness: Secondary | ICD-10-CM | POA: Diagnosis present

## 2020-02-12 DIAGNOSIS — I69991 Dysphagia following unspecified cerebrovascular disease: Secondary | ICD-10-CM | POA: Diagnosis not present

## 2020-02-12 DIAGNOSIS — Z4659 Encounter for fitting and adjustment of other gastrointestinal appliance and device: Secondary | ICD-10-CM

## 2020-02-13 ENCOUNTER — Other Ambulatory Visit (HOSPITAL_COMMUNITY): Payer: Medicare Other

## 2020-02-13 DIAGNOSIS — J189 Pneumonia, unspecified organism: Secondary | ICD-10-CM | POA: Diagnosis not present

## 2020-02-13 DIAGNOSIS — L89159 Pressure ulcer of sacral region, unspecified stage: Secondary | ICD-10-CM | POA: Diagnosis not present

## 2020-02-13 DIAGNOSIS — J90 Pleural effusion, not elsewhere classified: Secondary | ICD-10-CM | POA: Diagnosis not present

## 2020-02-13 DIAGNOSIS — I4891 Unspecified atrial fibrillation: Secondary | ICD-10-CM | POA: Diagnosis not present

## 2020-02-13 DIAGNOSIS — I10 Essential (primary) hypertension: Secondary | ICD-10-CM | POA: Diagnosis not present

## 2020-02-13 DIAGNOSIS — I517 Cardiomegaly: Secondary | ICD-10-CM | POA: Diagnosis not present

## 2020-02-13 DIAGNOSIS — I509 Heart failure, unspecified: Secondary | ICD-10-CM | POA: Diagnosis not present

## 2020-02-13 DIAGNOSIS — E785 Hyperlipidemia, unspecified: Secondary | ICD-10-CM | POA: Diagnosis not present

## 2020-02-13 DIAGNOSIS — J811 Chronic pulmonary edema: Secondary | ICD-10-CM | POA: Diagnosis not present

## 2020-02-13 LAB — CBC
HCT: 29.7 % — ABNORMAL LOW (ref 36.0–46.0)
Hemoglobin: 8.9 g/dL — ABNORMAL LOW (ref 12.0–15.0)
MCH: 27.5 pg (ref 26.0–34.0)
MCHC: 30 g/dL (ref 30.0–36.0)
MCV: 91.7 fL (ref 80.0–100.0)
Platelets: 275 10*3/uL (ref 150–400)
RBC: 3.24 MIL/uL — ABNORMAL LOW (ref 3.87–5.11)
RDW: 18.2 % — ABNORMAL HIGH (ref 11.5–15.5)
WBC: 7.1 10*3/uL (ref 4.0–10.5)
nRBC: 0 % (ref 0.0–0.2)

## 2020-02-13 LAB — PROTIME-INR
INR: 1.4 — ABNORMAL HIGH (ref 0.8–1.2)
Prothrombin Time: 16.6 seconds — ABNORMAL HIGH (ref 11.4–15.2)

## 2020-02-13 LAB — BASIC METABOLIC PANEL
Anion gap: 8 (ref 5–15)
BUN: 8 mg/dL (ref 8–23)
CO2: 28 mmol/L (ref 22–32)
Calcium: 8.2 mg/dL — ABNORMAL LOW (ref 8.9–10.3)
Chloride: 109 mmol/L (ref 98–111)
Creatinine, Ser: 0.71 mg/dL (ref 0.44–1.00)
GFR, Estimated: >60 mL/min (ref >60)
Glucose, Bld: 108 mg/dL — ABNORMAL HIGH (ref 70–99)
Potassium: 3.6 mmol/L (ref 3.5–5.1)
Sodium: 145 mmol/L (ref 135–145)

## 2020-02-13 LAB — APTT: aPTT: 39 seconds — ABNORMAL HIGH (ref 24–36)

## 2020-02-14 ENCOUNTER — Other Ambulatory Visit (HOSPITAL_COMMUNITY): Payer: Medicare Other

## 2020-02-14 DIAGNOSIS — L89159 Pressure ulcer of sacral region, unspecified stage: Secondary | ICD-10-CM | POA: Diagnosis not present

## 2020-02-14 DIAGNOSIS — I4891 Unspecified atrial fibrillation: Secondary | ICD-10-CM | POA: Diagnosis not present

## 2020-02-14 DIAGNOSIS — I10 Essential (primary) hypertension: Secondary | ICD-10-CM | POA: Diagnosis not present

## 2020-02-14 DIAGNOSIS — G890 Central pain syndrome: Secondary | ICD-10-CM | POA: Diagnosis not present

## 2020-02-14 DIAGNOSIS — G9340 Encephalopathy, unspecified: Secondary | ICD-10-CM | POA: Diagnosis not present

## 2020-02-14 DIAGNOSIS — G894 Chronic pain syndrome: Secondary | ICD-10-CM | POA: Diagnosis not present

## 2020-02-14 DIAGNOSIS — Z4682 Encounter for fitting and adjustment of non-vascular catheter: Secondary | ICD-10-CM | POA: Diagnosis not present

## 2020-02-14 DIAGNOSIS — E785 Hyperlipidemia, unspecified: Secondary | ICD-10-CM | POA: Diagnosis not present

## 2020-02-14 DIAGNOSIS — G40909 Epilepsy, unspecified, not intractable, without status epilepticus: Secondary | ICD-10-CM | POA: Diagnosis not present

## 2020-02-14 DIAGNOSIS — G8929 Other chronic pain: Secondary | ICD-10-CM | POA: Diagnosis not present

## 2020-02-14 DIAGNOSIS — I2510 Atherosclerotic heart disease of native coronary artery without angina pectoris: Secondary | ICD-10-CM | POA: Diagnosis not present

## 2020-02-14 DIAGNOSIS — I69351 Hemiplegia and hemiparesis following cerebral infarction affecting right dominant side: Secondary | ICD-10-CM | POA: Diagnosis not present

## 2020-02-14 DIAGNOSIS — F0390 Unspecified dementia without behavioral disturbance: Secondary | ICD-10-CM | POA: Diagnosis not present

## 2020-02-14 DIAGNOSIS — M1990 Unspecified osteoarthritis, unspecified site: Secondary | ICD-10-CM | POA: Diagnosis not present

## 2020-02-14 DIAGNOSIS — I25799 Atherosclerosis of other coronary artery bypass graft(s) with unspecified angina pectoris: Secondary | ICD-10-CM | POA: Diagnosis not present

## 2020-02-14 NOTE — Consult Note (Signed)
Infectious Disease Consultation   Diamond [name]  TIR:[id number]  DOB: Mar 02, 1941  DOA: 02/12/2020  Requesting physician: Dr.Hijazi  Reason for consultation: Antibiotic recommendations   History of Present Illness: Diamond [name] is an 79 y.o. female with medical history significant for hypertension, hyperlipidemia, CVA with right-sided deficits, dementia, atrial fibrillation, coronary artery disease, sacral wound.  Patient apparently had left hip fracture and underwent repair after which she was sent to a rehab facility.  Due to her immobility from her hip fracture she developed the sacral wound which continued to worsen.  She presented to [facility] and underwent treatment with antibiotics after which she was discharged to skilled nursing facility but again brought back to the hospital because of worsening sacral pressure ulcer.  She was readmitted to [facility] on 02/08/2020.  She had debridement done.  Not sure if any intraoperative cultures were sent.  She was treated with IV antibiotics as well as wound care.  Due to her complex wound and her needing IV antibiotics she was [facility]  She is currently on treatment with meropenem.  She has a wound VAC in place.  She is very confused.  Oriented x1 only.  Unable to obtain history from the patient.   Review of Systems:  Unable to obtain review of systems at this time.   Past Medical History: Past Medical History:  Diagnosis Date  . Arthritis    "arms, hands, neck, shoulders; some in my knees" (10/13/2017)  . CAD (coronary artery disease)   . CVA (cerebral vascular accident) (HCC) 1995, 2001   /notes 08/11/2010; "weakness in right arm as a result" (10/13/2017)  . History of blood transfusion    "related to OHS"  . History of kidney stones   . Hyperlipidemia   . Hypertension   . New onset a-fib (HCC) 10/13/2017  . Pneumonia    "when I was little"  . Subclavian steal  syndrome 2004   LUE/notes 10/13/2017  . TIA (transient ischemic attack) 2003   [name] 08/11/2010  . Vertigo 10/13/2017   ataxia/notes 10/13/2017  Atrial fibrillation, left hip fracture, history of CVA with right-sided weakness, sacral wound  Past Surgical History: Past Surgical History:  Procedure Laterality Date  . ABDOMINAL HYSTERECTOMY    . CARDIAC CATHETERIZATION  04/2003   [name] 08/11/2010  . CAROTID ARTERY - SUBCLAVIAN ARTERY BYPASS GRAFT Left 10/2002   carotid subclavian bypass with a 6 mm Dacron graft/notes 08/11/2010   . CARPAL TUNNEL RELEASE Right   . CHOLECYSTECTOMY OPEN    . CORONARY ANGIOPLASTY  04/2002   [name] 08/11/2010  . CORONARY ARTERY BYPASS GRAFT  05/2003   CABG X4/notes 08/11/2010  . CYSTOSCOPY W/ STONE MANIPULATION    . DILATION AND CURETTAGE OF UTERUS    . HERNIA REPAIR    . IR ANGIO INTRA EXTRACRAN SEL COM CAROTID INNOMINATE UNI L MOD SED  10/17/2017  . IR ANGIO INTRA EXTRACRAN SEL COM CAROTID INNOMINATE UNI R MOD SED  10/17/2017  . IR ANGIO VERTEBRAL SEL SUBCLAVIAN INNOMINATE BILAT MOD SED  10/17/2017  . IR ANGIOGRAM EXTREMITY BILATERAL  10/17/2017  . MEDIASTINAL EXPLORATION  05/2003   [name] 08/11/2010  . UMBILICAL HERNIA REPAIR    Left hip fracture repair October 2021, details unavailable. Recent debridement for sacral pressure ulcer unstageable   Allergies:   Allergies  Allergen Reactions  . Contrast Media [Iodinated Diagnostic Agents]   .  Codeine Other (See Comments)     Social History:  reports that she quit smoking about 27 years ago. Her smoking use included cigarettes. She has a 12.50 pack-year smoking history. She has never used smokeless tobacco. She reports current alcohol use. She reports that she does not use drugs.   Family History: Family History  Problem Relation Age of Onset  . Heart disease Father   . Stroke Mother     Physical Exam: Vitals: Temperature 98.3, pulse 87, respiratory rate 16, blood pressure 139/69, pulse oximetry  96% Constitutional: Ill-appearing female, oriented x1 only Head: Atraumatic, normocephalic Eyes: PERLA, EOMI ENMT: external ears and nose appear normal, normal hearing, she has dried scab on her lower lip, moist oral mucosa Neck: neck appears normal, supple CVS: S1-S2  Respiratory: Occasional rhonchi, no wheezing, decreased breath sound lower lobes Abdomen: Full, nontender, positive bowel sounds Musculoskeletal: No edema Neuro: She is confused. She not following any commands. Psych: Pleasantly confused Skin: no rashes   Data reviewed:  I have personally reviewed following labs and imaging studies Labs:  CBC: Recent Labs  Lab 02/13/20 0501  WBC 7.1  HGB 8.9*  HCT 29.7*  MCV 91.7  PLT 275    Basic Metabolic Panel: Recent Labs  Lab 02/13/20 0501  NA 145  K 3.6  CL 109  CO2 28  GLUCOSE 108*  BUN 8  CREATININE 0.71  CALCIUM 8.2*   GFR CrCl cannot be calculated (Unknown ideal weight.). Liver Function Tests: No results for input(s): AST, ALT, ALKPHOS, BILITOT, PROT, ALBUMIN in the last 168 hours. No results for input(s): LIPASE, AMYLASE in the last 168 hours. No results for input(s): AMMONIA in the last 168 hours. Coagulation profile Recent Labs  Lab 02/13/20 0501  INR 1.4*    Cardiac Enzymes: No results for input(s): CKTOTAL, CKMB, CKMBINDEX, TROPONINI in the last 168 hours. BNP: Invalid input(s): POCBNP CBG: No results for input(s): GLUCAP in the last 168 hours. D-Dimer No results for input(s): DDIMER in the last 72 hours. Hgb A1c No results for input(s): HGBA1C in the last 72 hours. Lipid Profile No results for input(s): CHOL, HDL, LDLCALC, TRIG, CHOLHDL, LDLDIRECT in the last 72 hours. Thyroid function studies No results for input(s): TSH, T4TOTAL, T3FREE, THYROIDAB in the last 72 hours.  Invalid input(s): FREET3 Anemia work up No results for input(s): VITAMINB12, FOLATE, FERRITIN, TIBC, IRON, RETICCTPCT in the last 72 hours. Urinalysis     Component Value Date/Time   COLORURINE YELLOW 10/15/2017 1800   APPEARANCEUR HAZY (A) 10/15/2017 1800   LABSPEC 1.014 10/15/2017 1800   PHURINE 6.0 10/15/2017 1800   GLUCOSEU NEGATIVE 10/15/2017 1800   HGBUR NEGATIVE 10/15/2017 1800   BILIRUBINUR NEGATIVE 10/15/2017 1800   KETONESUR NEGATIVE 10/15/2017 1800   PROTEINUR NEGATIVE 10/15/2017 1800   NITRITE NEGATIVE 10/15/2017 1800   LEUKOCYTESUR NEGATIVE 10/15/2017 1800     Microbiology No results found for this or any previous visit (from the past 240 hour(s)).  Inpatient Medications:   Please see MAR  Radiological Exams on Admission: DG CHEST PORT 1 VIEW  Result Date: 02/13/2020 CLINICAL DATA:  Pneumonia EXAM: PORTABLE CHEST 1 VIEW COMPARISON:  January 22, 2020 FINDINGS: There is a left pleural effusion with areas of airspace opacity in the left base, concerning for pneumonia. There is cardiomegaly with pulmonary venous hypertension. There is mild interstitial edema. Patient is status post coronary artery bypass grafting. No evident adenopathy. No bone lesions. IMPRESSION: Cardiomegaly with pulmonary vascular congestion. There is a degree of interstitial  edema with left pleural effusion. Suspect a degree of underlying congestive heart failure. There is concern for superimposed pneumonia left base. Status post coronary artery bypass grafting. Electronically Signed   By: Bretta Bang III M.D.   On: 02/13/2020 14:00    Impression/Recommendations Sacral pressure ulcer unstageable status post debridement History of CVA with right-sided deficits Delirium History of seizures Dementia? Atrial fibrillation History of coronary artery disease Debility  Sacral pressure ulcer unstageable: Patient apparently had debridement done at the acute facility.  We do not have any cultures available at this time.  She is currently on treatment with meropenem.  Her most recent debridement was 02/08/2020.  We will plan to treat with the meropenem  at 02/22/2020 which would be 2 weeks from the date of the most recent debridement.  She has a wound VAC in place.  However, high risk for fecal contamination of the wound. Continue local wound care.  Per primary team surgery being consulted.  Once the meropenem is completed if her wound is not improving consider Levaquin for another week.  Unfortunately due to her debility and immobility she is high risk for worsening of the wound.  History of CVA with right-sided deficits: Patient is on Eliquis, Lipitor.  Continue supportive management per the primary team.  Delirium: Patient is confused and oriented x1 only.  Could be secondary to the infection versus hospital delirium.  Continue supportive management per the primary team.  On antibiotics as mentioned above.  She is also high risk for UTI and sepsis.  If she starts having any worsening fevers, leukocytosis would recommend to send for pan cultures including blood cultures.\  History of seizures: Patient apparently had a history of seizures.  Currently on Keppra.  She has been tolerating the meropenem well.  However, antibiotics do tend to lower the seizure threshold.  Unfortunately given her worsening sacral wound we do need to cover her with antibiotics as she is also high risk for worsening infection and sepsis.  Please monitor her closely.  Dementia: There is a mention of dementia on the records.  However, unable to assess at this time.  Given her history of strokes is possible that she could be having vascular dementia which is exacerbated by the recent infection and worsening mental status.  Continue supportive management per primary team.  Atrial fibrillation/history of coronary artery disease: Continue medication and management per the primary team.  Debility: As mentioned above due to her debility she is high risk for worsening of the wound due to inability to offload.  Continue therapy and supportive management per the primary  team.  Unfortunately due to her complex medical problems she is high risk for worsening and decompensation.  Thank you for this consultation.  Plan of care discussed with the primary team and pharmacy.  Vonzella Nipple M.D. 02/14/2020, 3:41 PM

## 2020-02-15 DIAGNOSIS — G40909 Epilepsy, unspecified, not intractable, without status epilepticus: Secondary | ICD-10-CM | POA: Diagnosis not present

## 2020-02-15 DIAGNOSIS — E785 Hyperlipidemia, unspecified: Secondary | ICD-10-CM | POA: Diagnosis not present

## 2020-02-15 DIAGNOSIS — L89159 Pressure ulcer of sacral region, unspecified stage: Secondary | ICD-10-CM | POA: Diagnosis not present

## 2020-02-15 DIAGNOSIS — I10 Essential (primary) hypertension: Secondary | ICD-10-CM | POA: Diagnosis not present

## 2020-02-16 DIAGNOSIS — E11622 Type 2 diabetes mellitus with other skin ulcer: Secondary | ICD-10-CM | POA: Diagnosis not present

## 2020-02-16 DIAGNOSIS — I25810 Atherosclerosis of coronary artery bypass graft(s) without angina pectoris: Secondary | ICD-10-CM | POA: Diagnosis not present

## 2020-02-16 DIAGNOSIS — G40909 Epilepsy, unspecified, not intractable, without status epilepticus: Secondary | ICD-10-CM | POA: Diagnosis not present

## 2020-02-16 DIAGNOSIS — L89159 Pressure ulcer of sacral region, unspecified stage: Secondary | ICD-10-CM | POA: Diagnosis not present

## 2020-02-17 DIAGNOSIS — L89159 Pressure ulcer of sacral region, unspecified stage: Secondary | ICD-10-CM | POA: Diagnosis not present

## 2020-02-17 DIAGNOSIS — R569 Unspecified convulsions: Secondary | ICD-10-CM | POA: Diagnosis not present

## 2020-02-17 DIAGNOSIS — G9340 Encephalopathy, unspecified: Secondary | ICD-10-CM | POA: Diagnosis not present

## 2020-02-17 DIAGNOSIS — E785 Hyperlipidemia, unspecified: Secondary | ICD-10-CM | POA: Diagnosis not present

## 2020-02-17 LAB — CBC
HCT: 31.3 % — ABNORMAL LOW (ref 36.0–46.0)
Hemoglobin: 9.4 g/dL — ABNORMAL LOW (ref 12.0–15.0)
MCH: 27.2 pg (ref 26.0–34.0)
MCHC: 30 g/dL (ref 30.0–36.0)
MCV: 90.5 fL (ref 80.0–100.0)
Platelets: 316 10*3/uL (ref 150–400)
RBC: 3.46 MIL/uL — ABNORMAL LOW (ref 3.87–5.11)
RDW: 18.1 % — ABNORMAL HIGH (ref 11.5–15.5)
WBC: 8.8 10*3/uL (ref 4.0–10.5)
nRBC: 0 % (ref 0.0–0.2)

## 2020-02-17 LAB — BASIC METABOLIC PANEL
Anion gap: 9 (ref 5–15)
BUN: 16 mg/dL (ref 8–23)
CO2: 26 mmol/L (ref 22–32)
Calcium: 8 mg/dL — ABNORMAL LOW (ref 8.9–10.3)
Chloride: 109 mmol/L (ref 98–111)
Creatinine, Ser: 0.75 mg/dL (ref 0.44–1.00)
GFR, Estimated: >60 mL/min (ref >60)
Glucose, Bld: 115 mg/dL — ABNORMAL HIGH (ref 70–99)
Potassium: 3.3 mmol/L — ABNORMAL LOW (ref 3.5–5.1)
Sodium: 144 mmol/L (ref 135–145)

## 2020-02-17 LAB — MAGNESIUM: Magnesium: 1.6 mg/dL — ABNORMAL LOW (ref 1.7–2.4)

## 2020-02-18 DIAGNOSIS — L89159 Pressure ulcer of sacral region, unspecified stage: Secondary | ICD-10-CM | POA: Diagnosis not present

## 2020-02-18 DIAGNOSIS — E785 Hyperlipidemia, unspecified: Secondary | ICD-10-CM | POA: Diagnosis not present

## 2020-02-18 DIAGNOSIS — G40909 Epilepsy, unspecified, not intractable, without status epilepticus: Secondary | ICD-10-CM | POA: Diagnosis not present

## 2020-02-18 DIAGNOSIS — I25810 Atherosclerosis of coronary artery bypass graft(s) without angina pectoris: Secondary | ICD-10-CM | POA: Diagnosis not present

## 2020-02-18 LAB — POTASSIUM: Potassium: 3.8 mmol/L (ref 3.5–5.1)

## 2020-02-18 LAB — MAGNESIUM: Magnesium: 2.2 mg/dL (ref 1.7–2.4)

## 2020-02-19 DIAGNOSIS — E785 Hyperlipidemia, unspecified: Secondary | ICD-10-CM | POA: Diagnosis not present

## 2020-02-19 DIAGNOSIS — L89159 Pressure ulcer of sacral region, unspecified stage: Secondary | ICD-10-CM | POA: Diagnosis not present

## 2020-02-19 DIAGNOSIS — I25810 Atherosclerosis of coronary artery bypass graft(s) without angina pectoris: Secondary | ICD-10-CM | POA: Diagnosis not present

## 2020-02-19 DIAGNOSIS — G40909 Epilepsy, unspecified, not intractable, without status epilepticus: Secondary | ICD-10-CM | POA: Diagnosis not present

## 2020-02-19 LAB — PHOSPHORUS: Phosphorus: 2.9 mg/dL (ref 2.5–4.6)

## 2020-02-19 LAB — BASIC METABOLIC PANEL
Anion gap: 12 (ref 5–15)
BUN: 20 mg/dL (ref 8–23)
CO2: 26 mmol/L (ref 22–32)
Calcium: 7.9 mg/dL — ABNORMAL LOW (ref 8.9–10.3)
Chloride: 102 mmol/L (ref 98–111)
Creatinine, Ser: 0.62 mg/dL (ref 0.44–1.00)
GFR, Estimated: >60 mL/min (ref >60)
Glucose, Bld: 108 mg/dL — ABNORMAL HIGH (ref 70–99)
Potassium: 4.1 mmol/L (ref 3.5–5.1)
Sodium: 140 mmol/L (ref 135–145)

## 2020-02-19 LAB — CBC
HCT: 29.6 % — ABNORMAL LOW (ref 36.0–46.0)
Hemoglobin: 8.8 g/dL — ABNORMAL LOW (ref 12.0–15.0)
MCH: 27.2 pg (ref 26.0–34.0)
MCHC: 29.7 g/dL — ABNORMAL LOW (ref 30.0–36.0)
MCV: 91.4 fL (ref 80.0–100.0)
Platelets: 316 10*3/uL (ref 150–400)
RBC: 3.24 MIL/uL — ABNORMAL LOW (ref 3.87–5.11)
RDW: 18.1 % — ABNORMAL HIGH (ref 11.5–15.5)
WBC: 8.5 10*3/uL (ref 4.0–10.5)
nRBC: 0 % (ref 0.0–0.2)

## 2020-02-19 LAB — MAGNESIUM: Magnesium: 2 mg/dL (ref 1.7–2.4)

## 2020-02-20 ENCOUNTER — Other Ambulatory Visit: Payer: Self-pay | Admitting: Cardiology

## 2020-02-20 DIAGNOSIS — G40909 Epilepsy, unspecified, not intractable, without status epilepticus: Secondary | ICD-10-CM | POA: Diagnosis not present

## 2020-02-20 DIAGNOSIS — E785 Hyperlipidemia, unspecified: Secondary | ICD-10-CM | POA: Diagnosis not present

## 2020-02-20 DIAGNOSIS — L89159 Pressure ulcer of sacral region, unspecified stage: Secondary | ICD-10-CM | POA: Diagnosis not present

## 2020-02-20 DIAGNOSIS — I25810 Atherosclerosis of coronary artery bypass graft(s) without angina pectoris: Secondary | ICD-10-CM | POA: Diagnosis not present

## 2020-02-21 DIAGNOSIS — R569 Unspecified convulsions: Secondary | ICD-10-CM | POA: Diagnosis not present

## 2020-02-21 DIAGNOSIS — G9340 Encephalopathy, unspecified: Secondary | ICD-10-CM | POA: Diagnosis not present

## 2020-02-21 DIAGNOSIS — L89159 Pressure ulcer of sacral region, unspecified stage: Secondary | ICD-10-CM | POA: Diagnosis not present

## 2020-02-21 DIAGNOSIS — E785 Hyperlipidemia, unspecified: Secondary | ICD-10-CM | POA: Diagnosis not present

## 2020-02-21 LAB — BASIC METABOLIC PANEL
Anion gap: 12 (ref 5–15)
BUN: 20 mg/dL (ref 8–23)
CO2: 23 mmol/L (ref 22–32)
Calcium: 8.2 mg/dL — ABNORMAL LOW (ref 8.9–10.3)
Chloride: 106 mmol/L (ref 98–111)
Creatinine, Ser: 0.64 mg/dL (ref 0.44–1.00)
GFR, Estimated: >60 mL/min (ref >60)
Glucose, Bld: 123 mg/dL — ABNORMAL HIGH (ref 70–99)
Potassium: 4.2 mmol/L (ref 3.5–5.1)
Sodium: 141 mmol/L (ref 135–145)

## 2020-02-21 LAB — CBC
HCT: 29.9 % — ABNORMAL LOW (ref 36.0–46.0)
Hemoglobin: 8.7 g/dL — ABNORMAL LOW (ref 12.0–15.0)
MCH: 26.9 pg (ref 26.0–34.0)
MCHC: 29.1 g/dL — ABNORMAL LOW (ref 30.0–36.0)
MCV: 92.3 fL (ref 80.0–100.0)
Platelets: 316 10*3/uL (ref 150–400)
RBC: 3.24 MIL/uL — ABNORMAL LOW (ref 3.87–5.11)
RDW: 18.3 % — ABNORMAL HIGH (ref 11.5–15.5)
WBC: 7.7 10*3/uL (ref 4.0–10.5)
nRBC: 0 % (ref 0.0–0.2)

## 2020-02-21 LAB — PHOSPHORUS: Phosphorus: 3 mg/dL (ref 2.5–4.6)

## 2020-02-21 LAB — MAGNESIUM: Magnesium: 1.8 mg/dL (ref 1.7–2.4)

## 2020-02-22 ENCOUNTER — Other Ambulatory Visit (HOSPITAL_COMMUNITY): Payer: Medicare Other

## 2020-02-22 DIAGNOSIS — L89159 Pressure ulcer of sacral region, unspecified stage: Secondary | ICD-10-CM | POA: Diagnosis not present

## 2020-02-22 DIAGNOSIS — E785 Hyperlipidemia, unspecified: Secondary | ICD-10-CM | POA: Diagnosis not present

## 2020-02-22 DIAGNOSIS — G9340 Encephalopathy, unspecified: Secondary | ICD-10-CM | POA: Diagnosis not present

## 2020-02-22 DIAGNOSIS — R569 Unspecified convulsions: Secondary | ICD-10-CM | POA: Diagnosis not present

## 2020-02-23 DIAGNOSIS — E785 Hyperlipidemia, unspecified: Secondary | ICD-10-CM | POA: Diagnosis not present

## 2020-02-23 DIAGNOSIS — G40909 Epilepsy, unspecified, not intractable, without status epilepticus: Secondary | ICD-10-CM | POA: Diagnosis not present

## 2020-02-23 DIAGNOSIS — L89159 Pressure ulcer of sacral region, unspecified stage: Secondary | ICD-10-CM | POA: Diagnosis not present

## 2020-02-23 DIAGNOSIS — I25810 Atherosclerosis of coronary artery bypass graft(s) without angina pectoris: Secondary | ICD-10-CM | POA: Diagnosis not present

## 2020-02-24 LAB — BASIC METABOLIC PANEL
Anion gap: 10 (ref 5–15)
BUN: 20 mg/dL (ref 8–23)
CO2: 27 mmol/L (ref 22–32)
Calcium: 8.5 mg/dL — ABNORMAL LOW (ref 8.9–10.3)
Chloride: 103 mmol/L (ref 98–111)
Creatinine, Ser: 0.48 mg/dL (ref 0.44–1.00)
GFR, Estimated: >60 mL/min (ref >60)
Glucose, Bld: 114 mg/dL — ABNORMAL HIGH (ref 70–99)
Potassium: 3.7 mmol/L (ref 3.5–5.1)
Sodium: 140 mmol/L (ref 135–145)

## 2020-02-24 LAB — CBC
HCT: 28.9 % — ABNORMAL LOW (ref 36.0–46.0)
Hemoglobin: 8.6 g/dL — ABNORMAL LOW (ref 12.0–15.0)
MCH: 27.1 pg (ref 26.0–34.0)
MCHC: 29.8 g/dL — ABNORMAL LOW (ref 30.0–36.0)
MCV: 91.2 fL (ref 80.0–100.0)
Platelets: 368 10*3/uL (ref 150–400)
RBC: 3.17 MIL/uL — ABNORMAL LOW (ref 3.87–5.11)
RDW: 17.5 % — ABNORMAL HIGH (ref 11.5–15.5)
WBC: 8.8 10*3/uL (ref 4.0–10.5)
nRBC: 0 % (ref 0.0–0.2)

## 2020-02-24 LAB — PHOSPHORUS: Phosphorus: 3.3 mg/dL (ref 2.5–4.6)

## 2020-02-24 LAB — MAGNESIUM: Magnesium: 1.7 mg/dL (ref 1.7–2.4)

## 2020-02-25 DIAGNOSIS — I25810 Atherosclerosis of coronary artery bypass graft(s) without angina pectoris: Secondary | ICD-10-CM | POA: Diagnosis not present

## 2020-02-25 DIAGNOSIS — E785 Hyperlipidemia, unspecified: Secondary | ICD-10-CM | POA: Diagnosis not present

## 2020-02-25 DIAGNOSIS — G40909 Epilepsy, unspecified, not intractable, without status epilepticus: Secondary | ICD-10-CM | POA: Diagnosis not present

## 2020-02-25 DIAGNOSIS — L89159 Pressure ulcer of sacral region, unspecified stage: Secondary | ICD-10-CM | POA: Diagnosis not present

## 2020-02-25 LAB — LEVETIRACETAM LEVEL: Levetiracetam Lvl: 24.8 ug/mL (ref 10.0–40.0)

## 2020-02-25 LAB — MAGNESIUM: Magnesium: 1.5 mg/dL — ABNORMAL LOW (ref 1.7–2.4)

## 2020-02-26 ENCOUNTER — Other Ambulatory Visit (HOSPITAL_COMMUNITY): Payer: Medicare Other

## 2020-02-26 DIAGNOSIS — L89159 Pressure ulcer of sacral region, unspecified stage: Secondary | ICD-10-CM | POA: Diagnosis not present

## 2020-02-26 DIAGNOSIS — G40909 Epilepsy, unspecified, not intractable, without status epilepticus: Secondary | ICD-10-CM | POA: Diagnosis not present

## 2020-02-26 DIAGNOSIS — I25810 Atherosclerosis of coronary artery bypass graft(s) without angina pectoris: Secondary | ICD-10-CM | POA: Diagnosis not present

## 2020-02-26 DIAGNOSIS — E785 Hyperlipidemia, unspecified: Secondary | ICD-10-CM | POA: Diagnosis not present

## 2020-02-26 LAB — MAGNESIUM: Magnesium: 2 mg/dL (ref 1.7–2.4)

## 2020-02-27 DIAGNOSIS — L89159 Pressure ulcer of sacral region, unspecified stage: Secondary | ICD-10-CM | POA: Diagnosis not present

## 2020-02-27 DIAGNOSIS — G40909 Epilepsy, unspecified, not intractable, without status epilepticus: Secondary | ICD-10-CM | POA: Diagnosis not present

## 2020-02-27 DIAGNOSIS — I25810 Atherosclerosis of coronary artery bypass graft(s) without angina pectoris: Secondary | ICD-10-CM | POA: Diagnosis not present

## 2020-02-27 DIAGNOSIS — E785 Hyperlipidemia, unspecified: Secondary | ICD-10-CM | POA: Diagnosis not present

## 2020-02-27 LAB — BASIC METABOLIC PANEL
Anion gap: 12 (ref 5–15)
BUN: 15 mg/dL (ref 8–23)
CO2: 24 mmol/L (ref 22–32)
Calcium: 8 mg/dL — ABNORMAL LOW (ref 8.9–10.3)
Chloride: 98 mmol/L (ref 98–111)
Creatinine, Ser: 0.54 mg/dL (ref 0.44–1.00)
GFR, Estimated: >60 mL/min (ref >60)
Glucose, Bld: 109 mg/dL — ABNORMAL HIGH (ref 70–99)
Potassium: 3.8 mmol/L (ref 3.5–5.1)
Sodium: 134 mmol/L — ABNORMAL LOW (ref 135–145)

## 2020-02-28 DIAGNOSIS — G9340 Encephalopathy, unspecified: Secondary | ICD-10-CM | POA: Diagnosis not present

## 2020-02-28 DIAGNOSIS — I25810 Atherosclerosis of coronary artery bypass graft(s) without angina pectoris: Secondary | ICD-10-CM | POA: Diagnosis not present

## 2020-02-28 DIAGNOSIS — G8929 Other chronic pain: Secondary | ICD-10-CM | POA: Diagnosis not present

## 2020-02-28 DIAGNOSIS — F0390 Unspecified dementia without behavioral disturbance: Secondary | ICD-10-CM | POA: Diagnosis not present

## 2020-02-28 DIAGNOSIS — E785 Hyperlipidemia, unspecified: Secondary | ICD-10-CM | POA: Diagnosis not present

## 2020-02-28 DIAGNOSIS — I25799 Atherosclerosis of other coronary artery bypass graft(s) with unspecified angina pectoris: Secondary | ICD-10-CM | POA: Diagnosis not present

## 2020-02-28 DIAGNOSIS — M1990 Unspecified osteoarthritis, unspecified site: Secondary | ICD-10-CM | POA: Diagnosis not present

## 2020-02-28 DIAGNOSIS — G890 Central pain syndrome: Secondary | ICD-10-CM | POA: Diagnosis not present

## 2020-02-28 DIAGNOSIS — G40909 Epilepsy, unspecified, not intractable, without status epilepticus: Secondary | ICD-10-CM | POA: Diagnosis not present

## 2020-02-28 DIAGNOSIS — L89159 Pressure ulcer of sacral region, unspecified stage: Secondary | ICD-10-CM | POA: Diagnosis not present

## 2020-02-28 DIAGNOSIS — G894 Chronic pain syndrome: Secondary | ICD-10-CM | POA: Diagnosis not present

## 2020-02-29 DIAGNOSIS — E785 Hyperlipidemia, unspecified: Secondary | ICD-10-CM | POA: Diagnosis not present

## 2020-02-29 DIAGNOSIS — L89159 Pressure ulcer of sacral region, unspecified stage: Secondary | ICD-10-CM | POA: Diagnosis not present

## 2020-02-29 DIAGNOSIS — G40909 Epilepsy, unspecified, not intractable, without status epilepticus: Secondary | ICD-10-CM | POA: Diagnosis not present

## 2020-02-29 DIAGNOSIS — I25810 Atherosclerosis of coronary artery bypass graft(s) without angina pectoris: Secondary | ICD-10-CM | POA: Diagnosis not present

## 2020-02-29 LAB — CBC
HCT: 29.6 % — ABNORMAL LOW (ref 36.0–46.0)
Hemoglobin: 9.1 g/dL — ABNORMAL LOW (ref 12.0–15.0)
MCH: 26.9 pg (ref 26.0–34.0)
MCHC: 30.7 g/dL (ref 30.0–36.0)
MCV: 87.6 fL (ref 80.0–100.0)
Platelets: 353 10*3/uL (ref 150–400)
RBC: 3.38 MIL/uL — ABNORMAL LOW (ref 3.87–5.11)
RDW: 17.4 % — ABNORMAL HIGH (ref 11.5–15.5)
WBC: 9.1 10*3/uL (ref 4.0–10.5)
nRBC: 0 % (ref 0.0–0.2)

## 2020-02-29 LAB — BASIC METABOLIC PANEL
Anion gap: 13 (ref 5–15)
BUN: 26 mg/dL — ABNORMAL HIGH (ref 8–23)
CO2: 27 mmol/L (ref 22–32)
Calcium: 8.1 mg/dL — ABNORMAL LOW (ref 8.9–10.3)
Chloride: 93 mmol/L — ABNORMAL LOW (ref 98–111)
Creatinine, Ser: 0.54 mg/dL (ref 0.44–1.00)
GFR, Estimated: >60 mL/min (ref >60)
Glucose, Bld: 114 mg/dL — ABNORMAL HIGH (ref 70–99)
Potassium: 3.1 mmol/L — ABNORMAL LOW (ref 3.5–5.1)
Sodium: 133 mmol/L — ABNORMAL LOW (ref 135–145)

## 2020-02-29 LAB — MAGNESIUM: Magnesium: 1.6 mg/dL — ABNORMAL LOW (ref 1.7–2.4)

## 2020-03-01 DIAGNOSIS — L89159 Pressure ulcer of sacral region, unspecified stage: Secondary | ICD-10-CM | POA: Diagnosis not present

## 2020-03-01 DIAGNOSIS — R569 Unspecified convulsions: Secondary | ICD-10-CM | POA: Diagnosis not present

## 2020-03-01 DIAGNOSIS — G9340 Encephalopathy, unspecified: Secondary | ICD-10-CM | POA: Diagnosis not present

## 2020-03-01 DIAGNOSIS — E785 Hyperlipidemia, unspecified: Secondary | ICD-10-CM | POA: Diagnosis not present

## 2020-03-01 LAB — BASIC METABOLIC PANEL
Anion gap: 7 (ref 5–15)
BUN: 25 mg/dL — ABNORMAL HIGH (ref 8–23)
CO2: 34 mmol/L — ABNORMAL HIGH (ref 22–32)
Calcium: 7.8 mg/dL — ABNORMAL LOW (ref 8.9–10.3)
Chloride: 92 mmol/L — ABNORMAL LOW (ref 98–111)
Creatinine, Ser: 0.62 mg/dL (ref 0.44–1.00)
GFR, Estimated: >60 mL/min (ref >60)
Glucose, Bld: 107 mg/dL — ABNORMAL HIGH (ref 70–99)
Potassium: 3.5 mmol/L (ref 3.5–5.1)
Sodium: 133 mmol/L — ABNORMAL LOW (ref 135–145)

## 2020-03-01 LAB — MAGNESIUM: Magnesium: 2 mg/dL (ref 1.7–2.4)

## 2020-03-02 DIAGNOSIS — R569 Unspecified convulsions: Secondary | ICD-10-CM | POA: Diagnosis not present

## 2020-03-02 DIAGNOSIS — L89159 Pressure ulcer of sacral region, unspecified stage: Secondary | ICD-10-CM | POA: Diagnosis not present

## 2020-03-02 DIAGNOSIS — G9340 Encephalopathy, unspecified: Secondary | ICD-10-CM | POA: Diagnosis not present

## 2020-03-02 DIAGNOSIS — E785 Hyperlipidemia, unspecified: Secondary | ICD-10-CM | POA: Diagnosis not present

## 2020-03-03 DIAGNOSIS — E785 Hyperlipidemia, unspecified: Secondary | ICD-10-CM | POA: Diagnosis not present

## 2020-03-03 DIAGNOSIS — L89159 Pressure ulcer of sacral region, unspecified stage: Secondary | ICD-10-CM | POA: Diagnosis not present

## 2020-03-03 DIAGNOSIS — I25810 Atherosclerosis of coronary artery bypass graft(s) without angina pectoris: Secondary | ICD-10-CM | POA: Diagnosis not present

## 2020-03-03 DIAGNOSIS — G40909 Epilepsy, unspecified, not intractable, without status epilepticus: Secondary | ICD-10-CM | POA: Diagnosis not present

## 2020-03-03 LAB — CBC
HCT: 28.2 % — ABNORMAL LOW (ref 36.0–46.0)
Hemoglobin: 8.7 g/dL — ABNORMAL LOW (ref 12.0–15.0)
MCH: 26.6 pg (ref 26.0–34.0)
MCHC: 30.9 g/dL (ref 30.0–36.0)
MCV: 86.2 fL (ref 80.0–100.0)
Platelets: 325 10*3/uL (ref 150–400)
RBC: 3.27 MIL/uL — ABNORMAL LOW (ref 3.87–5.11)
RDW: 17.2 % — ABNORMAL HIGH (ref 11.5–15.5)
WBC: 8.4 10*3/uL (ref 4.0–10.5)
nRBC: 0 % (ref 0.0–0.2)

## 2020-03-03 LAB — BASIC METABOLIC PANEL
Anion gap: 11 (ref 5–15)
BUN: 23 mg/dL (ref 8–23)
CO2: 28 mmol/L (ref 22–32)
Calcium: 8.1 mg/dL — ABNORMAL LOW (ref 8.9–10.3)
Chloride: 94 mmol/L — ABNORMAL LOW (ref 98–111)
Creatinine, Ser: 0.56 mg/dL (ref 0.44–1.00)
GFR, Estimated: >60 mL/min (ref >60)
Glucose, Bld: 109 mg/dL — ABNORMAL HIGH (ref 70–99)
Potassium: 3.5 mmol/L (ref 3.5–5.1)
Sodium: 133 mmol/L — ABNORMAL LOW (ref 135–145)

## 2020-03-03 LAB — C DIFFICILE (CDIFF) QUICK SCRN (NO PCR REFLEX)
C Diff antigen: NEGATIVE
C Diff interpretation: NOT DETECTED
C Diff toxin: NEGATIVE

## 2020-03-03 LAB — MAGNESIUM: Magnesium: 1.8 mg/dL (ref 1.7–2.4)

## 2020-03-04 DIAGNOSIS — I4891 Unspecified atrial fibrillation: Secondary | ICD-10-CM | POA: Diagnosis not present

## 2020-03-04 DIAGNOSIS — R569 Unspecified convulsions: Secondary | ICD-10-CM | POA: Diagnosis not present

## 2020-03-04 DIAGNOSIS — E785 Hyperlipidemia, unspecified: Secondary | ICD-10-CM | POA: Diagnosis not present

## 2020-03-04 DIAGNOSIS — L89159 Pressure ulcer of sacral region, unspecified stage: Secondary | ICD-10-CM | POA: Diagnosis not present

## 2020-03-05 ENCOUNTER — Other Ambulatory Visit (HOSPITAL_COMMUNITY): Payer: Medicare Other

## 2020-03-05 DIAGNOSIS — E785 Hyperlipidemia, unspecified: Secondary | ICD-10-CM | POA: Diagnosis not present

## 2020-03-05 DIAGNOSIS — G40909 Epilepsy, unspecified, not intractable, without status epilepticus: Secondary | ICD-10-CM | POA: Diagnosis not present

## 2020-03-05 DIAGNOSIS — L89159 Pressure ulcer of sacral region, unspecified stage: Secondary | ICD-10-CM | POA: Diagnosis not present

## 2020-03-05 DIAGNOSIS — I25810 Atherosclerosis of coronary artery bypass graft(s) without angina pectoris: Secondary | ICD-10-CM | POA: Diagnosis not present

## 2020-03-05 LAB — MAGNESIUM: Magnesium: 1.7 mg/dL (ref 1.7–2.4)

## 2020-03-05 LAB — CBC
HCT: 25.7 % — ABNORMAL LOW (ref 36.0–46.0)
Hemoglobin: 8.2 g/dL — ABNORMAL LOW (ref 12.0–15.0)
MCH: 27.2 pg (ref 26.0–34.0)
MCHC: 31.9 g/dL (ref 30.0–36.0)
MCV: 85.1 fL (ref 80.0–100.0)
Platelets: 315 10*3/uL (ref 150–400)
RBC: 3.02 MIL/uL — ABNORMAL LOW (ref 3.87–5.11)
RDW: 17.5 % — ABNORMAL HIGH (ref 11.5–15.5)
WBC: 7.6 10*3/uL (ref 4.0–10.5)
nRBC: 0 % (ref 0.0–0.2)

## 2020-03-05 LAB — BASIC METABOLIC PANEL
Anion gap: 10 (ref 5–15)
BUN: 24 mg/dL — ABNORMAL HIGH (ref 8–23)
CO2: 30 mmol/L (ref 22–32)
Calcium: 8 mg/dL — ABNORMAL LOW (ref 8.9–10.3)
Chloride: 92 mmol/L — ABNORMAL LOW (ref 98–111)
Creatinine, Ser: 0.55 mg/dL (ref 0.44–1.00)
GFR, Estimated: >60 mL/min (ref >60)
Glucose, Bld: 114 mg/dL — ABNORMAL HIGH (ref 70–99)
Potassium: 3.3 mmol/L — ABNORMAL LOW (ref 3.5–5.1)
Sodium: 132 mmol/L — ABNORMAL LOW (ref 135–145)

## 2020-03-05 LAB — PHOSPHORUS: Phosphorus: 3.6 mg/dL (ref 2.5–4.6)

## 2020-03-06 DIAGNOSIS — G8929 Other chronic pain: Secondary | ICD-10-CM | POA: Diagnosis not present

## 2020-03-06 DIAGNOSIS — I4891 Unspecified atrial fibrillation: Secondary | ICD-10-CM | POA: Diagnosis not present

## 2020-03-06 DIAGNOSIS — G894 Chronic pain syndrome: Secondary | ICD-10-CM | POA: Diagnosis not present

## 2020-03-06 DIAGNOSIS — R569 Unspecified convulsions: Secondary | ICD-10-CM | POA: Diagnosis not present

## 2020-03-06 DIAGNOSIS — G890 Central pain syndrome: Secondary | ICD-10-CM | POA: Diagnosis not present

## 2020-03-06 DIAGNOSIS — L89150 Pressure ulcer of sacral region, unstageable: Secondary | ICD-10-CM | POA: Diagnosis not present

## 2020-03-06 DIAGNOSIS — I25799 Atherosclerosis of other coronary artery bypass graft(s) with unspecified angina pectoris: Secondary | ICD-10-CM | POA: Diagnosis not present

## 2020-03-06 DIAGNOSIS — G9340 Encephalopathy, unspecified: Secondary | ICD-10-CM | POA: Diagnosis not present

## 2020-03-06 DIAGNOSIS — M1990 Unspecified osteoarthritis, unspecified site: Secondary | ICD-10-CM | POA: Diagnosis not present

## 2020-03-06 DIAGNOSIS — F0390 Unspecified dementia without behavioral disturbance: Secondary | ICD-10-CM | POA: Diagnosis not present

## 2020-03-06 DIAGNOSIS — L89159 Pressure ulcer of sacral region, unspecified stage: Secondary | ICD-10-CM | POA: Diagnosis not present

## 2020-03-06 DIAGNOSIS — E785 Hyperlipidemia, unspecified: Secondary | ICD-10-CM | POA: Diagnosis not present

## 2020-03-06 LAB — BASIC METABOLIC PANEL
Anion gap: 9 (ref 5–15)
BUN: 27 mg/dL — ABNORMAL HIGH (ref 8–23)
CO2: 32 mmol/L (ref 22–32)
Calcium: 8 mg/dL — ABNORMAL LOW (ref 8.9–10.3)
Chloride: 91 mmol/L — ABNORMAL LOW (ref 98–111)
Creatinine, Ser: 0.59 mg/dL (ref 0.44–1.00)
GFR, Estimated: >60 mL/min (ref >60)
Glucose, Bld: 98 mg/dL (ref 70–99)
Potassium: 3.4 mmol/L — ABNORMAL LOW (ref 3.5–5.1)
Sodium: 132 mmol/L — ABNORMAL LOW (ref 135–145)

## 2020-03-06 LAB — MAGNESIUM: Magnesium: 2.2 mg/dL (ref 1.7–2.4)

## 2020-03-06 NOTE — Progress Notes (Signed)
Interventional Radiology Brief Note:  IR re-consulted for possible gastrostomy tube placement.  Patient previously evaluated for placement in IR, however felt to be at risk for complication due to ascites.  CT Abdomen Pelvis repeated by primary overnight and IR again asked to evaluate for candidacy for percutaneous placement.  Reviewed imaging with Dr. Deanne Coffer who notes while gastric anatomy is amenable for attempted percutaneous gastrostomy tube placement, persistent small volume  intra-abdominal ascites and therefore the risk-benefit ratio of gastrostomy tube placement in the setting of intra-abdominal ascites  must be considered. He recommends surgical consult due to her ascites.   Ordering provider notified.   Loyce Dys, MS RD PA-C 12:52 PM

## 2020-03-07 ENCOUNTER — Other Ambulatory Visit (HOSPITAL_COMMUNITY): Payer: Medicare Other

## 2020-03-07 DIAGNOSIS — I96 Gangrene, not elsewhere classified: Secondary | ICD-10-CM | POA: Diagnosis not present

## 2020-03-07 DIAGNOSIS — L89150 Pressure ulcer of sacral region, unstageable: Secondary | ICD-10-CM | POA: Diagnosis not present

## 2020-03-07 LAB — URINALYSIS, ROUTINE W REFLEX MICROSCOPIC
Bilirubin Urine: NEGATIVE
Glucose, UA: NEGATIVE mg/dL
Ketones, ur: NEGATIVE mg/dL
Nitrite: NEGATIVE
Protein, ur: 100 mg/dL — AB
Specific Gravity, Urine: 1.019 (ref 1.005–1.030)
WBC, UA: >50 WBC/hpf — ABNORMAL HIGH (ref 0–5)
pH: 6 (ref 5.0–8.0)

## 2020-03-07 LAB — CBC
HCT: 26 % — ABNORMAL LOW (ref 36.0–46.0)
Hemoglobin: 7.9 g/dL — ABNORMAL LOW (ref 12.0–15.0)
MCH: 26.2 pg (ref 26.0–34.0)
MCHC: 30.4 g/dL (ref 30.0–36.0)
MCV: 86.4 fL (ref 80.0–100.0)
Platelets: 310 10*3/uL (ref 150–400)
RBC: 3.01 MIL/uL — ABNORMAL LOW (ref 3.87–5.11)
RDW: 17.1 % — ABNORMAL HIGH (ref 11.5–15.5)
WBC: 7.7 10*3/uL (ref 4.0–10.5)
nRBC: 0 % (ref 0.0–0.2)

## 2020-03-07 LAB — BASIC METABOLIC PANEL
Anion gap: 10 (ref 5–15)
BUN: 24 mg/dL — ABNORMAL HIGH (ref 8–23)
CO2: 28 mmol/L (ref 22–32)
Calcium: 8.1 mg/dL — ABNORMAL LOW (ref 8.9–10.3)
Chloride: 92 mmol/L — ABNORMAL LOW (ref 98–111)
Creatinine, Ser: 0.61 mg/dL (ref 0.44–1.00)
GFR, Estimated: >60 mL/min (ref >60)
Glucose, Bld: 127 mg/dL — ABNORMAL HIGH (ref 70–99)
Potassium: 4.1 mmol/L (ref 3.5–5.1)
Sodium: 130 mmol/L — ABNORMAL LOW (ref 135–145)

## 2020-03-07 LAB — MAGNESIUM: Magnesium: 1.9 mg/dL (ref 1.7–2.4)

## 2020-03-08 ENCOUNTER — Other Ambulatory Visit (HOSPITAL_COMMUNITY): Payer: Medicare Other

## 2020-03-08 LAB — URINE CULTURE

## 2020-03-09 LAB — CBC
HCT: 24.8 % — ABNORMAL LOW (ref 36.0–46.0)
Hemoglobin: 7.5 g/dL — ABNORMAL LOW (ref 12.0–15.0)
MCH: 26.1 pg (ref 26.0–34.0)
MCHC: 30.2 g/dL (ref 30.0–36.0)
MCV: 86.4 fL (ref 80.0–100.0)
Platelets: 283 10*3/uL (ref 150–400)
RBC: 2.87 MIL/uL — ABNORMAL LOW (ref 3.87–5.11)
RDW: 17.1 % — ABNORMAL HIGH (ref 11.5–15.5)
WBC: 7.9 10*3/uL (ref 4.0–10.5)
nRBC: 0 % (ref 0.0–0.2)

## 2020-03-09 LAB — URINE CULTURE

## 2020-03-09 LAB — BASIC METABOLIC PANEL
Anion gap: 10 (ref 5–15)
BUN: 27 mg/dL — ABNORMAL HIGH (ref 8–23)
CO2: 29 mmol/L (ref 22–32)
Calcium: 7.8 mg/dL — ABNORMAL LOW (ref 8.9–10.3)
Chloride: 91 mmol/L — ABNORMAL LOW (ref 98–111)
Creatinine, Ser: 0.66 mg/dL (ref 0.44–1.00)
GFR, Estimated: >60 mL/min (ref >60)
Glucose, Bld: 116 mg/dL — ABNORMAL HIGH (ref 70–99)
Potassium: 3.6 mmol/L (ref 3.5–5.1)
Sodium: 130 mmol/L — ABNORMAL LOW (ref 135–145)

## 2020-03-09 LAB — MAGNESIUM: Magnesium: 1.8 mg/dL (ref 1.7–2.4)

## 2020-03-09 LAB — PHOSPHORUS: Phosphorus: 4.5 mg/dL (ref 2.5–4.6)

## 2020-03-11 LAB — CULTURE, BLOOD (ROUTINE X 2)
Culture: NO GROWTH
Culture: NO GROWTH
Special Requests: ADEQUATE
Special Requests: ADEQUATE

## 2020-03-12 LAB — URINE CULTURE: Culture: >=100000 — AB

## 2020-03-13 LAB — CBC
HCT: 25.7 % — ABNORMAL LOW (ref 36.0–46.0)
Hemoglobin: 7.7 g/dL — ABNORMAL LOW (ref 12.0–15.0)
MCH: 25.7 pg — ABNORMAL LOW (ref 26.0–34.0)
MCHC: 30 g/dL (ref 30.0–36.0)
MCV: 85.7 fL (ref 80.0–100.0)
Platelets: 426 10*3/uL — ABNORMAL HIGH (ref 150–400)
RBC: 3 MIL/uL — ABNORMAL LOW (ref 3.87–5.11)
RDW: 17.2 % — ABNORMAL HIGH (ref 11.5–15.5)
WBC: 7.9 10*3/uL (ref 4.0–10.5)
nRBC: 0 % (ref 0.0–0.2)

## 2020-03-13 LAB — BASIC METABOLIC PANEL
Anion gap: 10 (ref 5–15)
BUN: 23 mg/dL (ref 8–23)
CO2: 27 mmol/L (ref 22–32)
Calcium: 7.9 mg/dL — ABNORMAL LOW (ref 8.9–10.3)
Chloride: 95 mmol/L — ABNORMAL LOW (ref 98–111)
Creatinine, Ser: 0.49 mg/dL (ref 0.44–1.00)
GFR, Estimated: >60 mL/min (ref >60)
Glucose, Bld: 118 mg/dL — ABNORMAL HIGH (ref 70–99)
Potassium: 3.4 mmol/L — ABNORMAL LOW (ref 3.5–5.1)
Sodium: 132 mmol/L — ABNORMAL LOW (ref 135–145)

## 2020-03-13 LAB — SARS CORONAVIRUS 2 (TAT 6-24 HRS): SARS Coronavirus 2: NEGATIVE

## 2020-03-13 LAB — MAGNESIUM: Magnesium: 1.6 mg/dL — ABNORMAL LOW (ref 1.7–2.4)

## 2020-03-13 NOTE — Progress Notes (Signed)
PROGRESS NOTE    ALAYNE ESTRELLA  VEL:[id number] DOB: [birth year] DOA: 02/12/2020  Brief Narrative:  Diamond [name] is an 79 y.o. female with medical history significant for hypertension, hyperlipidemia, CVA with right-sided deficits, dementia, atrial fibrillation, coronary artery disease, sacral wound.  Patient apparently had left hip fracture and underwent repair after which she was sent to a rehab facility.  Due to her immobility from her hip fracture she developed the sacral wound which continued to worsen.  She presented to [facility] and underwent treatment with antibiotics after which she was discharged to skilled nursing facility but again brought back to the hospital because of worsening sacral pressure ulcer.  She was readmitted to [facility] on 02/08/2020.  She had debridement done.  Not sure if any intraoperative cultures were sent.  She was treated with IV antibiotics as well as wound care.  Due to her complex wound and her needing IV antibiotics she was discharged and admitted to [facility] on 02/12/2020. She completed treatment with meropenem. Because of her sacral wound and fear of contamination she has an indwelling Foley catheter.  She had a low-grade fever UA showed evidence of UTI.  Foley catheter has been changed per the primary team. Urine culture showing greater than 100,000 colonies per mL of Klebsiella pneumonia.   Assessment & Plan: Sacral pressure ulcer unstageable status post debridement UTI with Klebsiella Chronic Foley catheter History of CVA with right-sided deficits History of seizures Dementia? Atrial fibrillation History of coronary artery disease Debility  Sacral pressure ulcer unstageable: Patient apparently had debridement done at the acute facility.  We do not have any cultures available at this time.  She completed treatment with meropenem. She has a wound VAC in place.  However, high risk for urinary and fecal contamination of the wound. Continue  local wound care. Unfortunately due to her debility and immobility she is high risk for worsening of the wound.  UTI: Unfortunately patient requiring Foley catheter to avoid contamination of the sacral wound with the urine.  This places her at a high risk for recurrent UTI.  Urine cultures showing greater than 100,000 colonies per mL of Klebsiella pneumonia.  Recommend treatment with ceftriaxone.  Plan to treat for a duration of 1 week.  Per primary team Foley catheter has been changed.  Per the primary team there is a plan to discharge the patient with the Foley catheter to the facility.  Suggest evaluation of the wound and whenever feasible bladder training and removal of the Foley catheter.  History of CVA with right-sided deficits: Patient is on Eliquis, Lipitor.  Continue supportive management per the primary team.  History of seizures: Patient apparently had a history of seizures.  On Keppra.  She tolerated the meropenem well.  Now started on ceftriaxone for the UTI.  Continue medications and management per the primary team.  Dementia: There is a mention of dementia on the records.  However, unable to assess at this time.  Given her history of strokes is possible that she could be having vascular dementia which was exacerbated by the recent infection and worsening mental status.  Continue supportive management per primary team.  Atrial fibrillation/history of coronary artery disease: Continue medication and management per the primary team.  Debility: As mentioned above due to her debility she is high risk for worsening of the wound due to inability to offload.  Continue therapy and supportive management per the primary team.  Unfortunately due to her complex medical problems she is  high risk for worsening and decompensation. Plan of care discussed with the primary team.  Subjective: She is more awake and oriented.  Had low-grade fever.  Has an indwelling Foley  catheter.  Objective: Vitals: Temperature 100.3, pulse 96, respiratory rate 20, blood pressure 126/60, pulse oximetry 95% on oxygen nasal cannula.  Examination: General exam: Appears calm and comfortable  HEENT: Atraumatic, normocephalic, pupils equal and reactive, no ear or nose lesions Neck: Supple Respiratory system: Respiratory effort normal. Decreased breath sound lower lobes otherwise clear to auscultation. Cardiovascular system: S1 & S2 Gastrointestinal system: Abdomen is nondistended, soft and nontender. Normal bowel sounds heard. Central nervous system: Alert and oriented.  She is able to move all her extremities, has debility with generalized weakness.  Right-sided deficits from previous stroke.  No new neurologic deficits. Extremities: No edema Skin: Sacral pressure ulcer, no rashes Psychiatry: Mood & affect appropriate.     Data Reviewed: I have personally reviewed following labs and imaging studies  CBC: Recent Labs  Lab 03/07/20 0910 03/09/20 0616 03/13/20 0410  WBC 7.7 7.9 7.9  HGB 7.9* 7.5* 7.7*  HCT 26.0* 24.8* 25.7*  MCV 86.4 86.4 85.7  PLT 310 283 426*    Basic Metabolic Panel: Recent Labs  Lab 03/07/20 0910 03/09/20 0616 03/13/20 0410  NA 130* 130* 132*  K 4.1 3.6 3.4*  CL 92* 91* 95*  CO2 28 29 27   GLUCOSE 127* 116* 118*  BUN 24* 27* 23  CREATININE 0.61 0.66 0.49  CALCIUM 8.1* 7.8* 7.9*  MG 1.9 1.8 1.6*  PHOS  --  4.5  --     GFR: CrCl cannot be calculated (Unknown ideal weight.).  Liver Function Tests: No results for input(s): AST, ALT, ALKPHOS, BILITOT, PROT, ALBUMIN in the last 168 hours.  CBG: No results for input(s): GLUCAP in the last 168 hours.   Recent Results (from the past 240 hour(s))  Culture, blood (Routine X 2) w Reflex to ID Panel     Status: None   Collection Time: 03/06/20  6:43 PM   Specimen: BLOOD RIGHT HAND  Result Value Ref Range Status   Specimen Description BLOOD RIGHT HAND  Final   Special Requests    Final    BOTTLES DRAWN AEROBIC AND ANAEROBIC Blood Culture adequate volume   Culture   Final    NO GROWTH 5 DAYS Performed at [facility], [address] [place], [place]    Report Status 03/11/2020 FINAL  Final  Culture, blood (Routine X 2) w Reflex to ID Panel     Status: None   Collection Time: 03/06/20  6:47 PM   Specimen: BLOOD  Result Value Ref Range Status   Specimen Description BLOOD LEFT THUMB  Final   Special Requests   Final    BOTTLES DRAWN AEROBIC AND ANAEROBIC Blood Culture adequate volume   Culture   Final    NO GROWTH 5 DAYS Performed at [facility], [address], [place], [place]    Report Status 03/11/2020 FINAL  Final  Culture, Urine     Status: Abnormal   Collection Time: 03/07/20  6:00 AM   Specimen: Urine, Random  Result Value Ref Range Status   Specimen Description URINE, RANDOM  Final   Special Requests   Final    NONE Performed at [facility], [address], [place], [place]    Culture MULTIPLE SPECIES PRESENT, SUGGEST RECOLLECTION (A)  Final   Report Status  03/08/2020 FINAL  Final  Culture, Urine     Status: Abnormal   Collection Time: 03/08/20 10:38 AM   Specimen: Urine, Random  Result Value Ref Range Status   Specimen Description URINE, RANDOM  Final   Special Requests   Final    NONE Performed at [facility], [address] [place], [place] [address]    Culture MULTIPLE SPECIES PRESENT, SUGGEST RECOLLECTION (A)  Final   Report Status 03/09/2020 FINAL  Final  Culture, Urine     Status: Abnormal   Collection Time: 03/10/20  3:00 PM   Specimen: Urine, Random  Result Value Ref Range Status   Specimen Description URINE, RANDOM  Final   Special Requests   Final    NONE Performed at [facility], [address] [place], [place] [address]    Culture >=100,000 COLONIES/mL KLEBSIELLA PNEUMONIAE (A)  Final   Report Status 03/12/2020 FINAL  Final   Organism ID, Bacteria  KLEBSIELLA PNEUMONIAE (A)  Final      Susceptibility   Klebsiella pneumoniae - MIC*    AMPICILLIN RESISTANT Resistant     CEFAZOLIN <=4 SENSITIVE Sensitive     CEFEPIME <=0.12 SENSITIVE Sensitive     CEFTRIAXONE <=0.25 SENSITIVE Sensitive     CIPROFLOXACIN <=0.25 SENSITIVE Sensitive     GENTAMICIN <=1 SENSITIVE Sensitive     IMIPENEM <=0.25 SENSITIVE Sensitive     NITROFURANTOIN 128 RESISTANT Resistant     TRIMETH/SULFA <=20 SENSITIVE Sensitive     AMPICILLIN/SULBACTAM <=2 SENSITIVE Sensitive     PIP/TAZO <=4 SENSITIVE Sensitive     * >=100,000 COLONIES/mL KLEBSIELLA PNEUMONIAE     Radiology Studies: No results found.   Scheduled Meds: Please see MAR    Vonzella Nipple, MD  03/13/2020, 3:41 PM

## 2020-03-14 LAB — MAGNESIUM: Magnesium: 2 mg/dL (ref 1.7–2.4)

## 2020-03-14 LAB — BASIC METABOLIC PANEL
Anion gap: 11 (ref 5–15)
BUN: 19 mg/dL (ref 8–23)
CO2: 26 mmol/L (ref 22–32)
Calcium: 8.2 mg/dL — ABNORMAL LOW (ref 8.9–10.3)
Chloride: 94 mmol/L — ABNORMAL LOW (ref 98–111)
Creatinine, Ser: 0.47 mg/dL (ref 0.44–1.00)
GFR, Estimated: >60 mL/min (ref >60)
Glucose, Bld: 86 mg/dL (ref 70–99)
Potassium: 4 mmol/L (ref 3.5–5.1)
Sodium: 131 mmol/L — ABNORMAL LOW (ref 135–145)

## 2020-03-15 LAB — TSH: TSH: 6.296 u[IU]/mL — ABNORMAL HIGH (ref 0.350–4.500)

## 2020-03-15 LAB — CORTISOL: Cortisol, Plasma: 16.1 ug/dL

## 2020-03-16 LAB — BASIC METABOLIC PANEL
Anion gap: 12 (ref 5–15)
BUN: 11 mg/dL (ref 8–23)
CO2: 22 mmol/L (ref 22–32)
Calcium: 8.2 mg/dL — ABNORMAL LOW (ref 8.9–10.3)
Chloride: 98 mmol/L (ref 98–111)
Creatinine, Ser: 0.53 mg/dL (ref 0.44–1.00)
GFR, Estimated: >60 mL/min (ref >60)
Glucose, Bld: 90 mg/dL (ref 70–99)
Potassium: 3.6 mmol/L (ref 3.5–5.1)
Sodium: 132 mmol/L — ABNORMAL LOW (ref 135–145)

## 2020-03-18 LAB — BASIC METABOLIC PANEL
Anion gap: 8 (ref 5–15)
BUN: 12 mg/dL (ref 8–23)
CO2: 22 mmol/L (ref 22–32)
Calcium: 8.1 mg/dL — ABNORMAL LOW (ref 8.9–10.3)
Chloride: 104 mmol/L (ref 98–111)
Creatinine, Ser: 0.45 mg/dL (ref 0.44–1.00)
GFR, Estimated: >60 mL/min (ref >60)
Glucose, Bld: 101 mg/dL — ABNORMAL HIGH (ref 70–99)
Potassium: 3.6 mmol/L (ref 3.5–5.1)
Sodium: 134 mmol/L — ABNORMAL LOW (ref 135–145)

## 2020-03-18 LAB — CBC
HCT: 23.7 % — ABNORMAL LOW (ref 36.0–46.0)
Hemoglobin: 7.1 g/dL — ABNORMAL LOW (ref 12.0–15.0)
MCH: 26 pg (ref 26.0–34.0)
MCHC: 30 g/dL (ref 30.0–36.0)
MCV: 86.8 fL (ref 80.0–100.0)
Platelets: 433 10*3/uL — ABNORMAL HIGH (ref 150–400)
RBC: 2.73 MIL/uL — ABNORMAL LOW (ref 3.87–5.11)
RDW: 17.8 % — ABNORMAL HIGH (ref 11.5–15.5)
WBC: 6 10*3/uL (ref 4.0–10.5)
nRBC: 0 % (ref 0.0–0.2)

## 2020-03-18 LAB — SARS CORONAVIRUS 2 (TAT 6-24 HRS): SARS Coronavirus 2: NEGATIVE

## 2020-03-18 LAB — MAGNESIUM: Magnesium: 1.5 mg/dL — ABNORMAL LOW (ref 1.7–2.4)

## 2020-03-19 DIAGNOSIS — Z7401 Bed confinement status: Secondary | ICD-10-CM | POA: Diagnosis not present

## 2020-03-19 DIAGNOSIS — R531 Weakness: Secondary | ICD-10-CM | POA: Diagnosis not present

## 2020-03-19 DIAGNOSIS — E785 Hyperlipidemia, unspecified: Secondary | ICD-10-CM | POA: Diagnosis not present

## 2020-03-19 DIAGNOSIS — R4182 Altered mental status, unspecified: Secondary | ICD-10-CM | POA: Diagnosis not present

## 2020-03-19 DIAGNOSIS — I2510 Atherosclerotic heart disease of native coronary artery without angina pectoris: Secondary | ICD-10-CM | POA: Diagnosis not present

## 2020-03-19 DIAGNOSIS — Z23 Encounter for immunization: Secondary | ICD-10-CM | POA: Diagnosis not present

## 2020-03-19 DIAGNOSIS — R109 Unspecified abdominal pain: Secondary | ICD-10-CM | POA: Diagnosis not present

## 2020-03-19 DIAGNOSIS — I959 Hypotension, unspecified: Secondary | ICD-10-CM | POA: Diagnosis not present

## 2020-03-19 DIAGNOSIS — L89620 Pressure ulcer of left heel, unstageable: Secondary | ICD-10-CM | POA: Diagnosis not present

## 2020-03-19 DIAGNOSIS — L89154 Pressure ulcer of sacral region, stage 4: Secondary | ICD-10-CM | POA: Diagnosis not present

## 2020-03-19 DIAGNOSIS — I25810 Atherosclerosis of coronary artery bypass graft(s) without angina pectoris: Secondary | ICD-10-CM | POA: Diagnosis not present

## 2020-03-19 DIAGNOSIS — S72002S Fracture of unspecified part of neck of left femur, sequela: Secondary | ICD-10-CM | POA: Diagnosis not present

## 2020-03-19 DIAGNOSIS — B9689 Other specified bacterial agents as the cause of diseases classified elsewhere: Secondary | ICD-10-CM | POA: Diagnosis not present

## 2020-03-19 DIAGNOSIS — I4891 Unspecified atrial fibrillation: Secondary | ICD-10-CM | POA: Diagnosis not present

## 2020-03-19 DIAGNOSIS — I499 Cardiac arrhythmia, unspecified: Secondary | ICD-10-CM | POA: Diagnosis not present

## 2020-03-19 DIAGNOSIS — I10 Essential (primary) hypertension: Secondary | ICD-10-CM | POA: Diagnosis not present

## 2020-03-19 DIAGNOSIS — E43 Unspecified severe protein-calorie malnutrition: Secondary | ICD-10-CM | POA: Diagnosis not present

## 2020-03-19 DIAGNOSIS — M6259 Muscle wasting and atrophy, not elsewhere classified, multiple sites: Secondary | ICD-10-CM | POA: Diagnosis not present

## 2020-03-19 DIAGNOSIS — E871 Hypo-osmolality and hyponatremia: Secondary | ICD-10-CM | POA: Diagnosis not present

## 2020-03-19 DIAGNOSIS — F0390 Unspecified dementia without behavioral disturbance: Secondary | ICD-10-CM | POA: Diagnosis not present

## 2020-03-19 DIAGNOSIS — R569 Unspecified convulsions: Secondary | ICD-10-CM | POA: Diagnosis not present

## 2020-03-19 DIAGNOSIS — L89150 Pressure ulcer of sacral region, unstageable: Secondary | ICD-10-CM | POA: Diagnosis not present

## 2020-03-19 DIAGNOSIS — R7989 Other specified abnormal findings of blood chemistry: Secondary | ICD-10-CM | POA: Diagnosis not present

## 2020-03-19 DIAGNOSIS — L89159 Pressure ulcer of sacral region, unspecified stage: Secondary | ICD-10-CM | POA: Diagnosis not present

## 2020-03-19 DIAGNOSIS — I69351 Hemiplegia and hemiparesis following cerebral infarction affecting right dominant side: Secondary | ICD-10-CM | POA: Diagnosis not present

## 2020-03-19 DIAGNOSIS — R41841 Cognitive communication deficit: Secondary | ICD-10-CM | POA: Diagnosis not present

## 2020-03-19 DIAGNOSIS — R4189 Other symptoms and signs involving cognitive functions and awareness: Secondary | ICD-10-CM | POA: Diagnosis not present

## 2020-03-19 DIAGNOSIS — L97822 Non-pressure chronic ulcer of other part of left lower leg with fat layer exposed: Secondary | ICD-10-CM | POA: Diagnosis not present

## 2020-03-19 DIAGNOSIS — G40909 Epilepsy, unspecified, not intractable, without status epilepticus: Secondary | ICD-10-CM | POA: Diagnosis not present

## 2020-03-19 DIAGNOSIS — J189 Pneumonia, unspecified organism: Secondary | ICD-10-CM | POA: Diagnosis not present

## 2020-03-19 DIAGNOSIS — M629 Disorder of muscle, unspecified: Secondary | ICD-10-CM | POA: Diagnosis not present

## 2020-03-19 DIAGNOSIS — N939 Abnormal uterine and vaginal bleeding, unspecified: Secondary | ICD-10-CM | POA: Diagnosis not present

## 2020-03-19 DIAGNOSIS — F339 Major depressive disorder, recurrent, unspecified: Secondary | ICD-10-CM | POA: Diagnosis not present

## 2020-03-19 DIAGNOSIS — G9340 Encephalopathy, unspecified: Secondary | ICD-10-CM | POA: Diagnosis not present

## 2020-03-19 DIAGNOSIS — I152 Hypertension secondary to endocrine disorders: Secondary | ICD-10-CM | POA: Diagnosis not present

## 2020-03-19 DIAGNOSIS — N390 Urinary tract infection, site not specified: Secondary | ICD-10-CM | POA: Diagnosis not present

## 2020-03-19 DIAGNOSIS — M2550 Pain in unspecified joint: Secondary | ICD-10-CM | POA: Diagnosis not present

## 2020-03-19 DIAGNOSIS — R188 Other ascites: Secondary | ICD-10-CM | POA: Diagnosis not present

## 2020-03-19 DIAGNOSIS — M6281 Muscle weakness (generalized): Secondary | ICD-10-CM | POA: Diagnosis not present

## 2020-03-19 DIAGNOSIS — E1159 Type 2 diabetes mellitus with other circulatory complications: Secondary | ICD-10-CM | POA: Diagnosis not present

## 2020-03-19 DIAGNOSIS — M6250 Muscle wasting and atrophy, not elsewhere classified, unspecified site: Secondary | ICD-10-CM | POA: Diagnosis not present

## 2020-03-19 DIAGNOSIS — L98419 Non-pressure chronic ulcer of buttock with unspecified severity: Secondary | ICD-10-CM | POA: Diagnosis not present

## 2020-03-19 DIAGNOSIS — L89890 Pressure ulcer of other site, unstageable: Secondary | ICD-10-CM | POA: Diagnosis not present

## 2020-03-19 DIAGNOSIS — R1310 Dysphagia, unspecified: Secondary | ICD-10-CM | POA: Diagnosis not present

## 2020-03-19 LAB — CBC
HCT: 26.8 % — ABNORMAL LOW (ref 36.0–46.0)
Hemoglobin: 7.8 g/dL — ABNORMAL LOW (ref 12.0–15.0)
MCH: 25.7 pg — ABNORMAL LOW (ref 26.0–34.0)
MCHC: 29.1 g/dL — ABNORMAL LOW (ref 30.0–36.0)
MCV: 88.4 fL (ref 80.0–100.0)
Platelets: 486 10*3/uL — ABNORMAL HIGH (ref 150–400)
RBC: 3.03 MIL/uL — ABNORMAL LOW (ref 3.87–5.11)
RDW: 18.1 % — ABNORMAL HIGH (ref 11.5–15.5)
WBC: 7.5 10*3/uL (ref 4.0–10.5)
nRBC: 0 % (ref 0.0–0.2)

## 2020-03-19 LAB — MAGNESIUM: Magnesium: 2.1 mg/dL (ref 1.7–2.4)

## 2020-03-20 DIAGNOSIS — I69351 Hemiplegia and hemiparesis following cerebral infarction affecting right dominant side: Secondary | ICD-10-CM | POA: Diagnosis not present

## 2020-03-20 DIAGNOSIS — R569 Unspecified convulsions: Secondary | ICD-10-CM | POA: Diagnosis not present

## 2020-03-20 DIAGNOSIS — L89150 Pressure ulcer of sacral region, unstageable: Secondary | ICD-10-CM | POA: Diagnosis not present

## 2020-03-20 DIAGNOSIS — R531 Weakness: Secondary | ICD-10-CM | POA: Diagnosis not present

## 2020-03-24 DIAGNOSIS — L89154 Pressure ulcer of sacral region, stage 4: Secondary | ICD-10-CM | POA: Diagnosis not present

## 2020-03-25 DIAGNOSIS — L89150 Pressure ulcer of sacral region, unstageable: Secondary | ICD-10-CM | POA: Diagnosis not present

## 2020-03-25 DIAGNOSIS — M6250 Muscle wasting and atrophy, not elsewhere classified, unspecified site: Secondary | ICD-10-CM | POA: Diagnosis not present

## 2020-03-25 DIAGNOSIS — R531 Weakness: Secondary | ICD-10-CM | POA: Diagnosis not present

## 2020-03-29 DIAGNOSIS — M6281 Muscle weakness (generalized): Secondary | ICD-10-CM | POA: Diagnosis not present

## 2020-03-29 DIAGNOSIS — I2510 Atherosclerotic heart disease of native coronary artery without angina pectoris: Secondary | ICD-10-CM | POA: Diagnosis not present

## 2020-03-29 DIAGNOSIS — R4182 Altered mental status, unspecified: Secondary | ICD-10-CM | POA: Diagnosis not present

## 2020-03-29 DIAGNOSIS — E785 Hyperlipidemia, unspecified: Secondary | ICD-10-CM | POA: Diagnosis not present

## 2020-03-29 DIAGNOSIS — L89154 Pressure ulcer of sacral region, stage 4: Secondary | ICD-10-CM | POA: Diagnosis not present

## 2020-03-29 DIAGNOSIS — F0390 Unspecified dementia without behavioral disturbance: Secondary | ICD-10-CM | POA: Diagnosis not present

## 2020-03-29 DIAGNOSIS — S72002S Fracture of unspecified part of neck of left femur, sequela: Secondary | ICD-10-CM | POA: Diagnosis not present

## 2020-03-29 DIAGNOSIS — B9689 Other specified bacterial agents as the cause of diseases classified elsewhere: Secondary | ICD-10-CM | POA: Diagnosis not present

## 2020-03-29 DIAGNOSIS — M6250 Muscle wasting and atrophy, not elsewhere classified, unspecified site: Secondary | ICD-10-CM | POA: Diagnosis not present

## 2020-03-29 DIAGNOSIS — L89150 Pressure ulcer of sacral region, unstageable: Secondary | ICD-10-CM | POA: Diagnosis not present

## 2020-03-29 DIAGNOSIS — E43 Unspecified severe protein-calorie malnutrition: Secondary | ICD-10-CM | POA: Diagnosis not present

## 2020-03-29 DIAGNOSIS — E871 Hypo-osmolality and hyponatremia: Secondary | ICD-10-CM | POA: Diagnosis not present

## 2020-03-29 DIAGNOSIS — N939 Abnormal uterine and vaginal bleeding, unspecified: Secondary | ICD-10-CM | POA: Diagnosis not present

## 2020-03-29 DIAGNOSIS — N390 Urinary tract infection, site not specified: Secondary | ICD-10-CM | POA: Diagnosis not present

## 2020-03-29 DIAGNOSIS — F339 Major depressive disorder, recurrent, unspecified: Secondary | ICD-10-CM | POA: Diagnosis not present

## 2020-03-29 DIAGNOSIS — R109 Unspecified abdominal pain: Secondary | ICD-10-CM | POA: Diagnosis not present

## 2020-03-29 DIAGNOSIS — I10 Essential (primary) hypertension: Secondary | ICD-10-CM | POA: Diagnosis not present

## 2020-03-29 DIAGNOSIS — R531 Weakness: Secondary | ICD-10-CM | POA: Diagnosis not present

## 2020-03-29 DIAGNOSIS — R41841 Cognitive communication deficit: Secondary | ICD-10-CM | POA: Diagnosis not present

## 2020-03-29 DIAGNOSIS — M6259 Muscle wasting and atrophy, not elsewhere classified, multiple sites: Secondary | ICD-10-CM | POA: Diagnosis not present

## 2020-03-29 DIAGNOSIS — G9340 Encephalopathy, unspecified: Secondary | ICD-10-CM | POA: Diagnosis not present

## 2020-03-29 DIAGNOSIS — R1310 Dysphagia, unspecified: Secondary | ICD-10-CM | POA: Diagnosis not present

## 2020-03-29 DIAGNOSIS — I69351 Hemiplegia and hemiparesis following cerebral infarction affecting right dominant side: Secondary | ICD-10-CM | POA: Diagnosis not present

## 2020-03-29 DIAGNOSIS — E1159 Type 2 diabetes mellitus with other circulatory complications: Secondary | ICD-10-CM | POA: Diagnosis not present

## 2020-03-29 DIAGNOSIS — I4891 Unspecified atrial fibrillation: Secondary | ICD-10-CM | POA: Diagnosis not present

## 2020-03-29 DIAGNOSIS — G40909 Epilepsy, unspecified, not intractable, without status epilepticus: Secondary | ICD-10-CM | POA: Diagnosis not present

## 2020-04-01 DIAGNOSIS — L89150 Pressure ulcer of sacral region, unstageable: Secondary | ICD-10-CM | POA: Diagnosis not present

## 2020-04-01 DIAGNOSIS — R531 Weakness: Secondary | ICD-10-CM | POA: Diagnosis not present

## 2020-04-01 DIAGNOSIS — E1159 Type 2 diabetes mellitus with other circulatory complications: Secondary | ICD-10-CM | POA: Diagnosis not present

## 2020-04-01 DIAGNOSIS — M6250 Muscle wasting and atrophy, not elsewhere classified, unspecified site: Secondary | ICD-10-CM | POA: Diagnosis not present

## 2020-04-11 DIAGNOSIS — L89620 Pressure ulcer of left heel, unstageable: Secondary | ICD-10-CM | POA: Diagnosis not present

## 2020-04-11 DIAGNOSIS — L89154 Pressure ulcer of sacral region, stage 4: Secondary | ICD-10-CM | POA: Diagnosis not present

## 2020-04-15 DIAGNOSIS — R109 Unspecified abdominal pain: Secondary | ICD-10-CM | POA: Diagnosis not present

## 2020-04-21 DIAGNOSIS — N939 Abnormal uterine and vaginal bleeding, unspecified: Secondary | ICD-10-CM | POA: Diagnosis not present

## 2020-04-21 DIAGNOSIS — N390 Urinary tract infection, site not specified: Secondary | ICD-10-CM | POA: Diagnosis not present

## 2020-04-24 DIAGNOSIS — L98419 Non-pressure chronic ulcer of buttock with unspecified severity: Secondary | ICD-10-CM | POA: Diagnosis not present

## 2020-04-25 DIAGNOSIS — L89150 Pressure ulcer of sacral region, unstageable: Secondary | ICD-10-CM | POA: Diagnosis not present

## 2020-04-25 DIAGNOSIS — N390 Urinary tract infection, site not specified: Secondary | ICD-10-CM | POA: Diagnosis not present

## 2020-04-25 DIAGNOSIS — B9689 Other specified bacterial agents as the cause of diseases classified elsewhere: Secondary | ICD-10-CM | POA: Diagnosis not present

## 2020-04-25 DIAGNOSIS — R4182 Altered mental status, unspecified: Secondary | ICD-10-CM | POA: Diagnosis not present

## 2020-04-29 DIAGNOSIS — M6259 Muscle wasting and atrophy, not elsewhere classified, multiple sites: Secondary | ICD-10-CM | POA: Diagnosis not present

## 2020-04-29 DIAGNOSIS — M629 Disorder of muscle, unspecified: Secondary | ICD-10-CM | POA: Diagnosis not present

## 2020-05-01 DIAGNOSIS — I69351 Hemiplegia and hemiparesis following cerebral infarction affecting right dominant side: Secondary | ICD-10-CM | POA: Diagnosis not present

## 2020-05-01 DIAGNOSIS — R4189 Other symptoms and signs involving cognitive functions and awareness: Secondary | ICD-10-CM | POA: Diagnosis not present

## 2020-05-01 DIAGNOSIS — R7989 Other specified abnormal findings of blood chemistry: Secondary | ICD-10-CM | POA: Diagnosis not present

## 2020-05-01 DIAGNOSIS — L97822 Non-pressure chronic ulcer of other part of left lower leg with fat layer exposed: Secondary | ICD-10-CM | POA: Diagnosis not present

## 2020-05-01 DIAGNOSIS — L98419 Non-pressure chronic ulcer of buttock with unspecified severity: Secondary | ICD-10-CM | POA: Diagnosis not present

## 2020-05-01 DIAGNOSIS — L89150 Pressure ulcer of sacral region, unstageable: Secondary | ICD-10-CM | POA: Diagnosis not present

## 2020-05-08 DIAGNOSIS — I152 Hypertension secondary to endocrine disorders: Secondary | ICD-10-CM | POA: Diagnosis not present

## 2020-05-08 DIAGNOSIS — L89890 Pressure ulcer of other site, unstageable: Secondary | ICD-10-CM | POA: Diagnosis not present

## 2020-05-08 DIAGNOSIS — E1159 Type 2 diabetes mellitus with other circulatory complications: Secondary | ICD-10-CM | POA: Diagnosis not present

## 2020-05-08 DIAGNOSIS — R569 Unspecified convulsions: Secondary | ICD-10-CM | POA: Diagnosis not present

## 2020-05-08 DIAGNOSIS — I69351 Hemiplegia and hemiparesis following cerebral infarction affecting right dominant side: Secondary | ICD-10-CM | POA: Diagnosis not present

## 2020-05-15 DIAGNOSIS — L98419 Non-pressure chronic ulcer of buttock with unspecified severity: Secondary | ICD-10-CM | POA: Diagnosis not present

## 2020-05-27 DEATH — deceased

## 2021-02-12 IMAGING — DX DG CHEST 1V PORT
1 series · 1 of 1 positions shown · non-contrast
Comparison: January 22, 2020

CLINICAL DATA: Pneumonia

EXAM:
PORTABLE CHEST 1 VIEW

[chest ap]
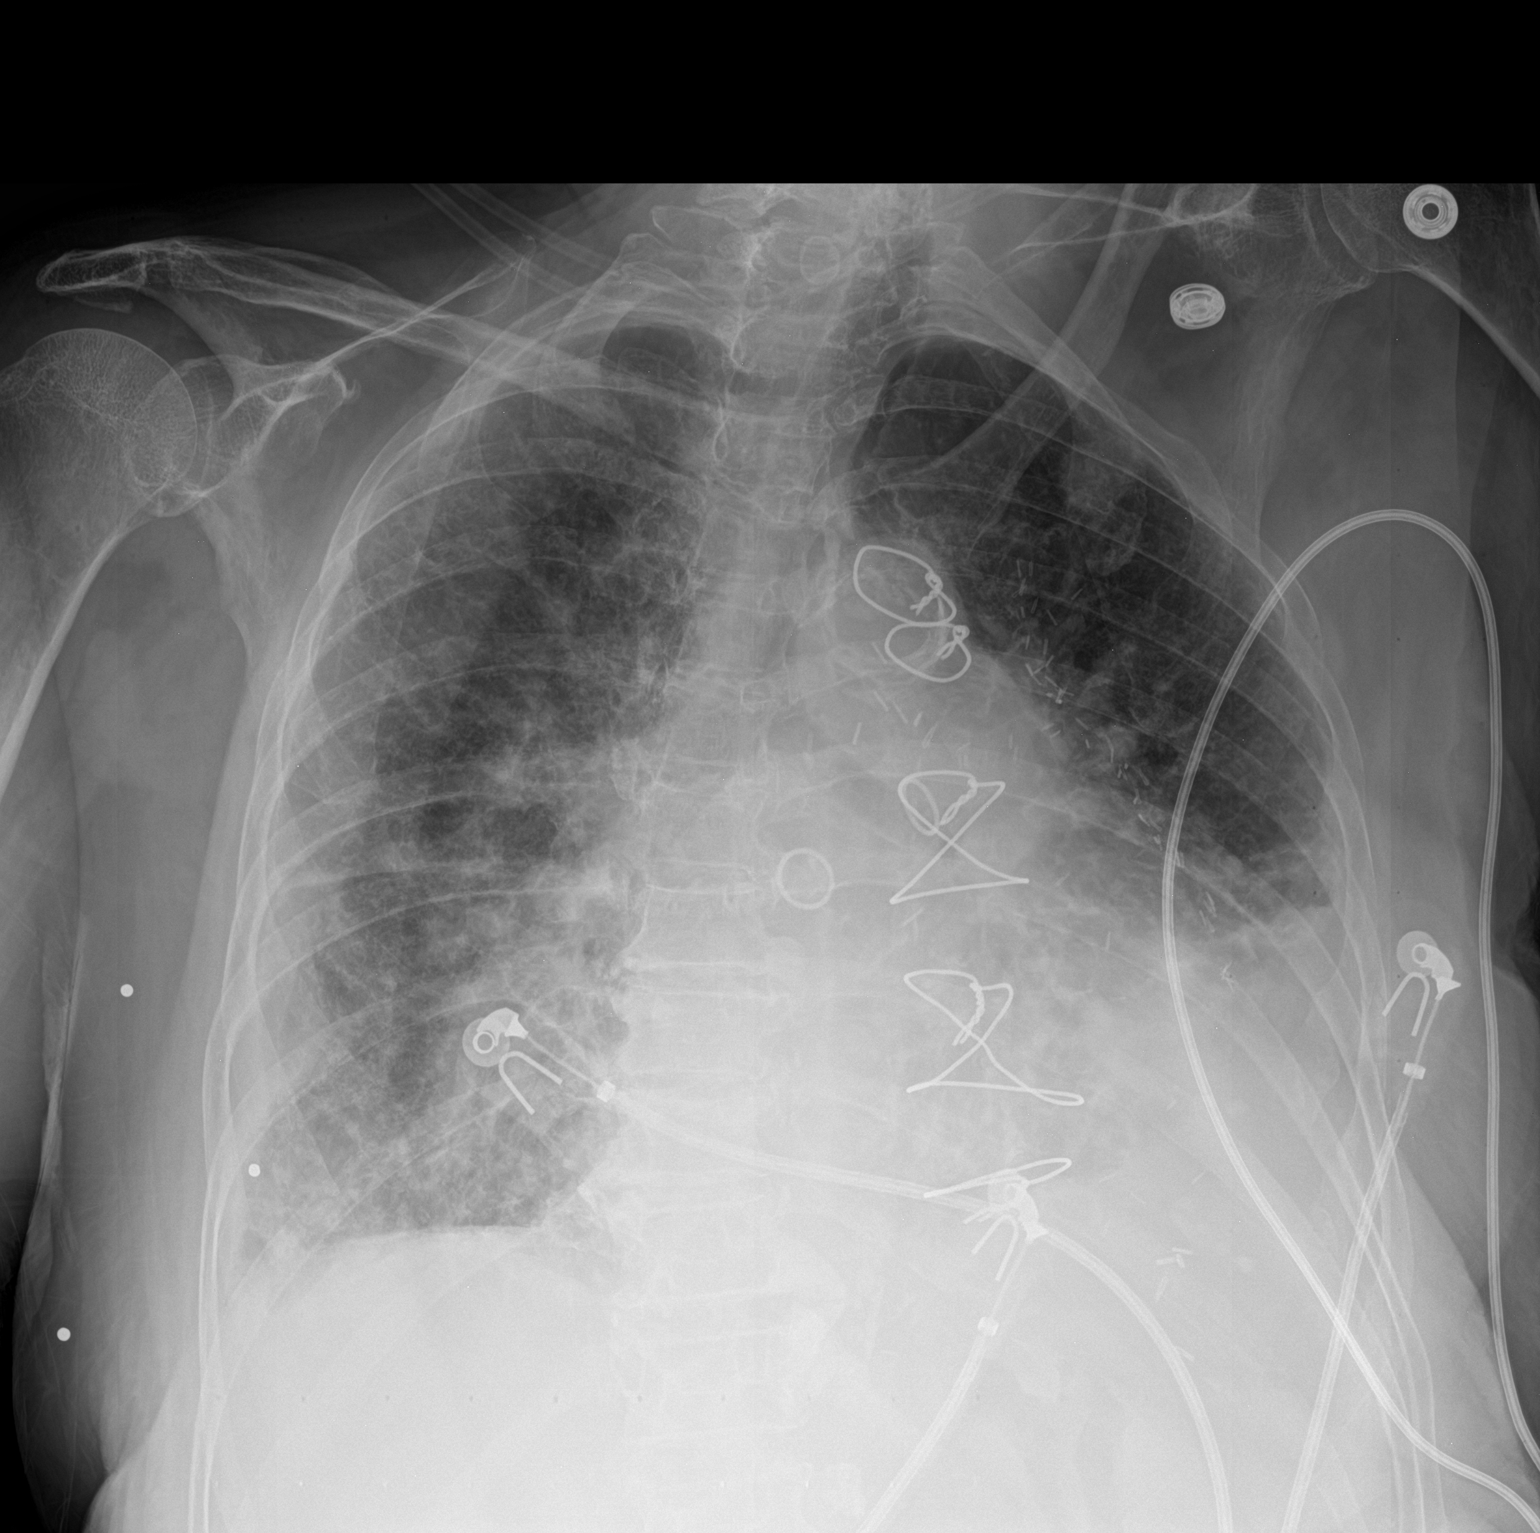

[1 of 1 positions shown; findings below may reference images not displayed]

FINDINGS: There is a left pleural effusion with areas of airspace opacity in
the left base, concerning for pneumonia. There is cardiomegaly with
pulmonary venous hypertension. There is mild interstitial edema.
Patient is status post coronary artery bypass grafting. No evident
adenopathy. No bone lesions.
IMPRESSION: Cardiomegaly with pulmonary vascular congestion. There is a degree
of interstitial edema with left pleural effusion. Suspect a degree
of underlying congestive heart failure. There is concern for
superimposed pneumonia left base. Status post coronary artery bypass
grafting.

## 2021-02-25 IMAGING — DX DG ABD PORTABLE 1V
1 series · 2 of 2 positions shown · non-contrast
Comparison: 02/14/2020

CLINICAL DATA: Check gastric catheter placement

EXAM:
PORTABLE ABDOMEN - 1 VIEW

[Series 2: abdomen kub · 0.14mm/px · 2 of 2 slices shown]
[im 1/2]
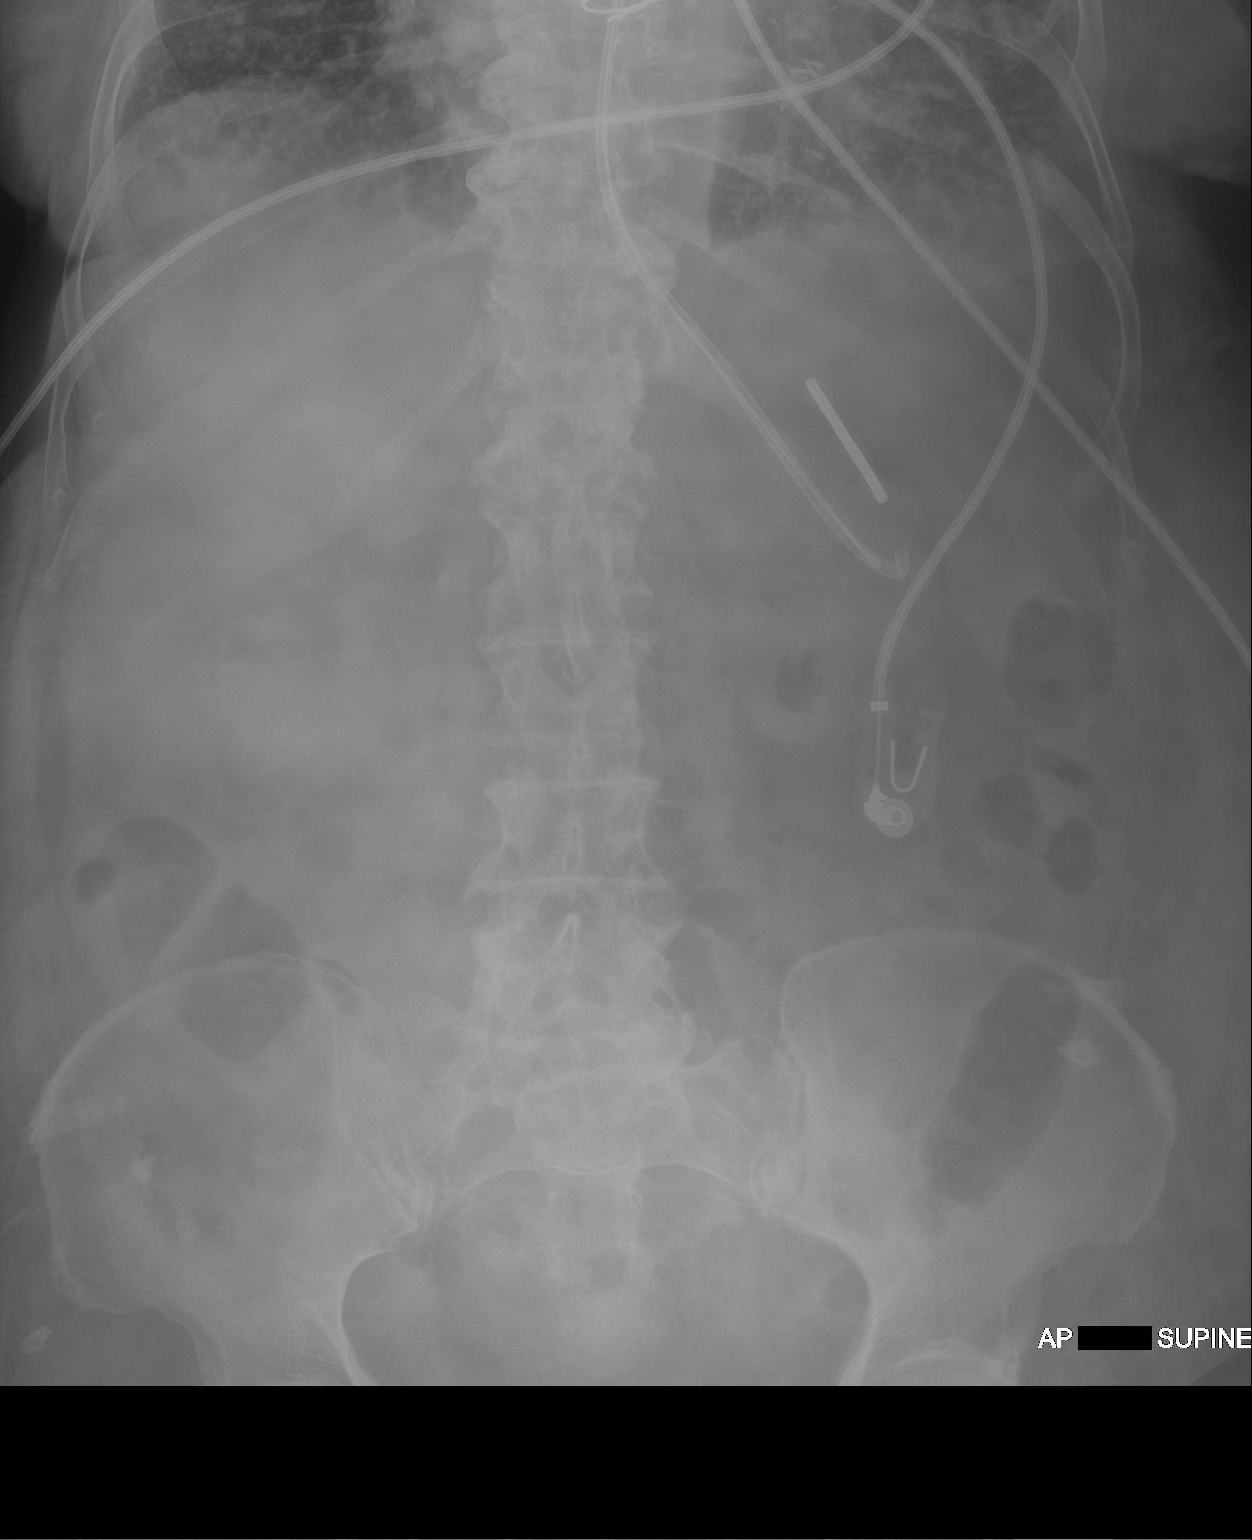
[im 2/2]
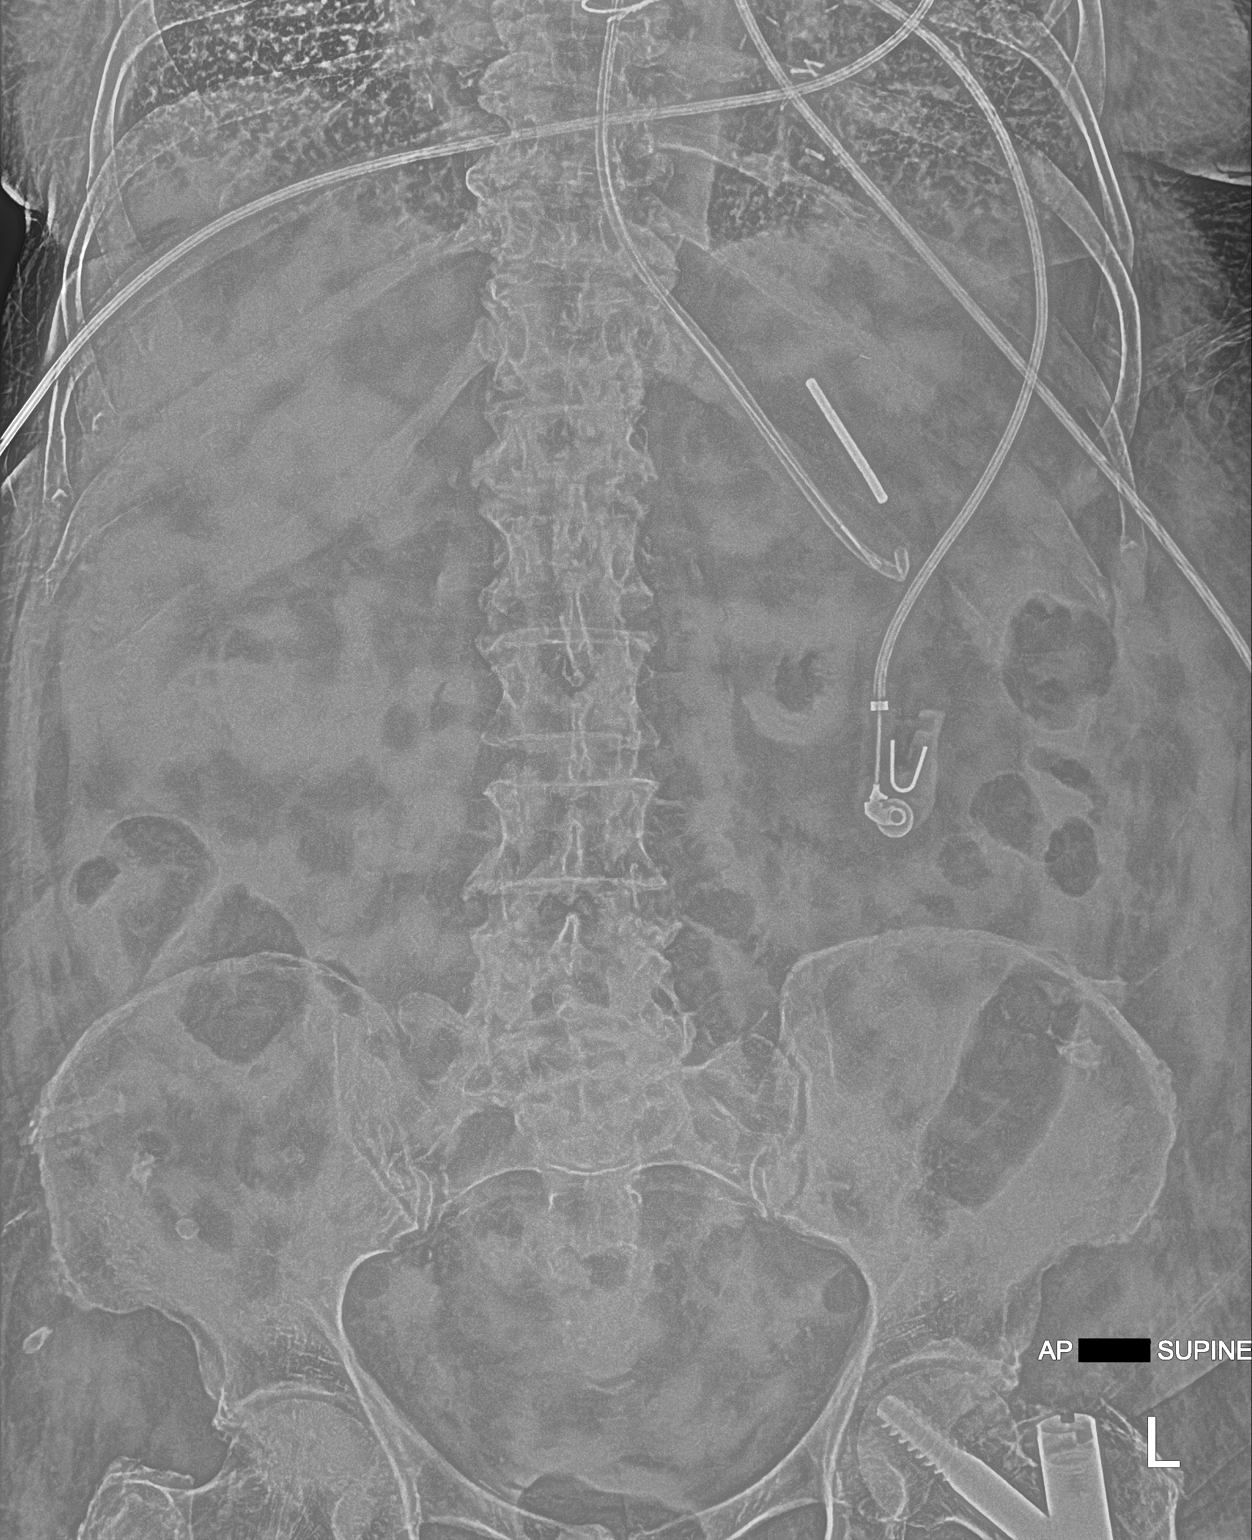

[2 of 2 positions shown; findings below may reference images not displayed]

FINDINGS: Weighted feeding catheter is noted within the stomach. This is
withdrawn when compared with the prior exam.
IMPRESSION: Weighted feeding catheter within the stomach.

## 2021-03-07 IMAGING — DX DG CHEST 1V PORT
1 series · 1 of 1 positions shown · non-contrast
Comparison: Chest x-ray 02/13/2020, 01/22/2020, 10/13/2017.

CLINICAL DATA: Encounter for aspiration.

EXAM:
PORTABLE CHEST 1 VIEW

[chest ap]
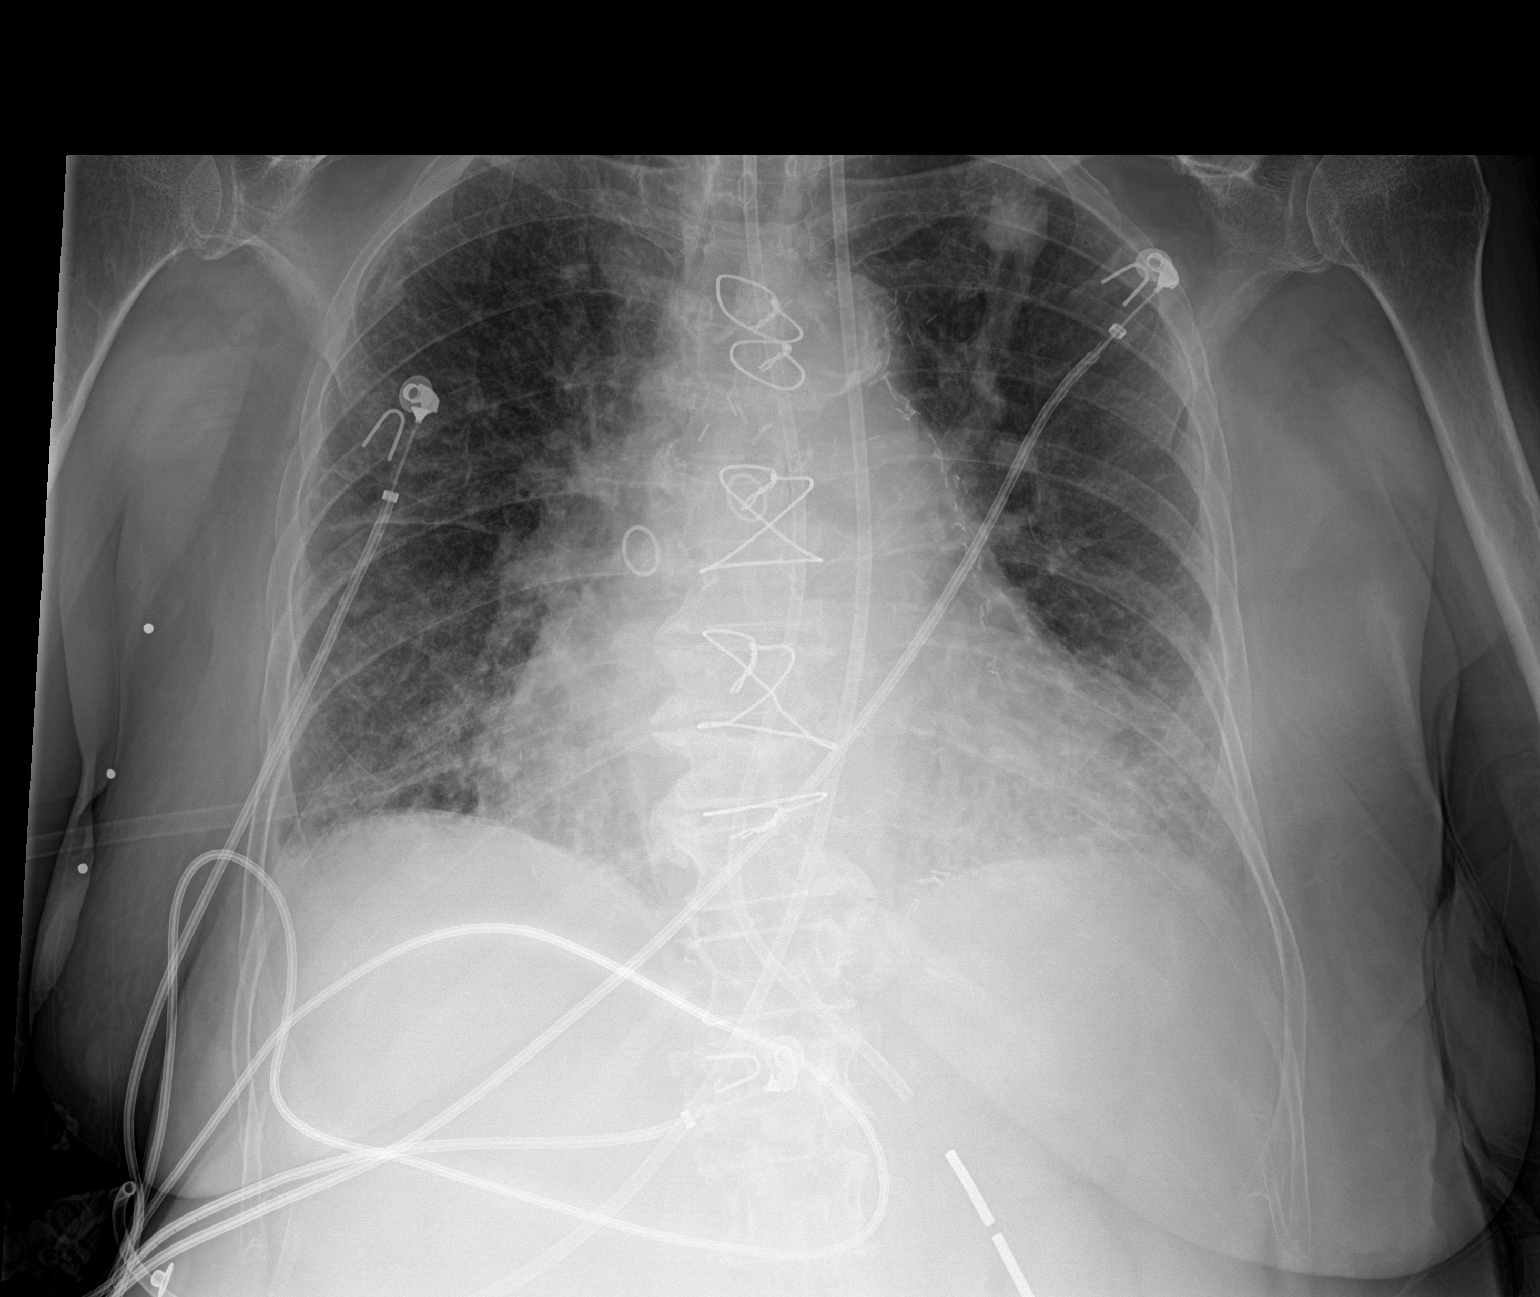

[1 of 1 positions shown; findings below may reference images not displayed]

FINDINGS: Feeding tube noted with tip over the stomach. Prior CABG.
Cardiomegaly. Density noted over the left upper lung. This could be
related overlying tubing. Underlying focal pulmonary lesion cannot
be excluded. Follow-up on subsequent exams suggested. Diffuse
bilateral interstitial prominence consistent interstitial edema
again noted. Tiny right pleural effusion. Interim improvement of
left pleural effusion. No pneumothorax.
IMPRESSION: 1. Feeding tube noted with tip over the stomach.
2. Prior CABG. Cardiomegaly.
3. Bilateral interstitial prominence consistent with interstitial
edema again noted. Tiny right pleural effusion. Interim improvement
of left pleural effusion.
